# Patient Record
Sex: Male | Born: 1965 | Race: White | Hispanic: No | Marital: Married | Smoking: Former smoker
Health system: Southern US, Community
[De-identification: ages and names within clinical notes are randomized; demographics above are authoritative.]

## PROBLEM LIST (undated history)

## (undated) DIAGNOSIS — E041 Nontoxic single thyroid nodule: Secondary | ICD-10-CM

## (undated) DIAGNOSIS — T8859XA Other complications of anesthesia, initial encounter: Secondary | ICD-10-CM

## (undated) DIAGNOSIS — T4145XA Adverse effect of unspecified anesthetic, initial encounter: Secondary | ICD-10-CM

## (undated) DIAGNOSIS — I7100 Dissection of unspecified site of aorta: Secondary | ICD-10-CM

## (undated) DIAGNOSIS — G4730 Sleep apnea, unspecified: Secondary | ICD-10-CM

## (undated) DIAGNOSIS — I10 Essential (primary) hypertension: Secondary | ICD-10-CM

## (undated) DIAGNOSIS — R112 Nausea with vomiting, unspecified: Secondary | ICD-10-CM

## (undated) DIAGNOSIS — I499 Cardiac arrhythmia, unspecified: Secondary | ICD-10-CM

## (undated) DIAGNOSIS — M1712 Unilateral primary osteoarthritis, left knee: Secondary | ICD-10-CM

## (undated) DIAGNOSIS — Z9889 Other specified postprocedural states: Secondary | ICD-10-CM

## (undated) HISTORY — PX: LAMINECTOMY: SHX219

## (undated) HISTORY — PX: KNEE ARTHROSCOPY: SHX127

## (undated) HISTORY — PX: JOINT REPLACEMENT: SHX530

## (undated) HISTORY — PX: ELBOW SURGERY: SHX618

## (undated) HISTORY — PX: BACK SURGERY: SHX140

## (undated) HISTORY — PX: APPLICATION OF WOUND VAC: SHX5189

## (undated) HISTORY — PX: CHOLECYSTECTOMY: SHX55

---

## 2012-02-16 DIAGNOSIS — I7100 Dissection of unspecified site of aorta: Secondary | ICD-10-CM

## 2012-02-16 HISTORY — DX: Dissection of unspecified site of aorta: I71.00

## 2017-05-07 ENCOUNTER — Inpatient Hospital Stay (HOSPITAL_COMMUNITY)
Admission: EM | Admit: 2017-05-07 | Discharge: 2017-05-15 | DRG: 570 | Disposition: A | Discharge status: Home Health | Payer: Managed Care, Other (non HMO) | Attending: Orthopedic Surgery | Admitting: Orthopedic Surgery

## 2017-05-07 ENCOUNTER — Encounter (HOSPITAL_COMMUNITY): Payer: Self-pay | Admitting: Emergency Medicine

## 2017-05-07 DIAGNOSIS — S81019A Laceration without foreign body, unspecified knee, initial encounter: Secondary | ICD-10-CM | POA: Diagnosis present

## 2017-05-07 DIAGNOSIS — W208XXA Other cause of strike by thrown, projected or falling object, initial encounter: Secondary | ICD-10-CM | POA: Diagnosis present

## 2017-05-07 DIAGNOSIS — R52 Pain, unspecified: Secondary | ICD-10-CM

## 2017-05-07 DIAGNOSIS — S71139A Puncture wound without foreign body, unspecified thigh, initial encounter: Secondary | ICD-10-CM | POA: Diagnosis present

## 2017-05-07 DIAGNOSIS — M25562 Pain in left knee: Secondary | ICD-10-CM | POA: Diagnosis not present

## 2017-05-07 DIAGNOSIS — S71112A Laceration without foreign body, left thigh, initial encounter: Secondary | ICD-10-CM | POA: Diagnosis present

## 2017-05-07 DIAGNOSIS — I7100 Dissection of unspecified site of aorta: Secondary | ICD-10-CM | POA: Diagnosis present

## 2017-05-07 DIAGNOSIS — I10 Essential (primary) hypertension: Secondary | ICD-10-CM | POA: Diagnosis present

## 2017-05-07 DIAGNOSIS — S81009A Unspecified open wound, unspecified knee, initial encounter: Secondary | ICD-10-CM | POA: Diagnosis present

## 2017-05-07 DIAGNOSIS — S81832A Puncture wound without foreign body, left lower leg, initial encounter: Secondary | ICD-10-CM | POA: Diagnosis not present

## 2017-05-07 DIAGNOSIS — S81012A Laceration without foreign body, left knee, initial encounter: Secondary | ICD-10-CM | POA: Diagnosis present

## 2017-05-07 DIAGNOSIS — M726 Necrotizing fasciitis: Secondary | ICD-10-CM | POA: Diagnosis present

## 2017-05-07 DIAGNOSIS — Z6841 Body Mass Index (BMI) 40.0 and over, adult: Secondary | ICD-10-CM

## 2017-05-07 DIAGNOSIS — Z87891 Personal history of nicotine dependence: Secondary | ICD-10-CM

## 2017-05-07 DIAGNOSIS — S81839A Puncture wound without foreign body, unspecified lower leg, initial encounter: Secondary | ICD-10-CM

## 2017-05-07 HISTORY — PX: LACERATION REPAIR: SHX5168

## 2017-05-07 HISTORY — DX: Essential (primary) hypertension: I10

## 2017-05-07 HISTORY — DX: Dissection of unspecified site of aorta: I71.00
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
## 2017-05-08 ENCOUNTER — Emergency Department (HOSPITAL_COMMUNITY): Payer: Managed Care, Other (non HMO) | Admitting: Anesthesiology

## 2017-05-08 ENCOUNTER — Emergency Department (HOSPITAL_COMMUNITY): Payer: Managed Care, Other (non HMO)

## 2017-05-08 ENCOUNTER — Inpatient Hospital Stay (HOSPITAL_COMMUNITY): Payer: Managed Care, Other (non HMO)

## 2017-05-08 ENCOUNTER — Encounter (HOSPITAL_COMMUNITY)
Admission: EM | Disposition: A | Discharge status: Home Health | Payer: Self-pay | Source: Home / Self Care | Attending: Orthopedic Surgery

## 2017-05-08 ENCOUNTER — Encounter (HOSPITAL_COMMUNITY): Payer: Self-pay | Admitting: Anesthesiology

## 2017-05-08 DIAGNOSIS — W208XXA Other cause of strike by thrown, projected or falling object, initial encounter: Secondary | ICD-10-CM | POA: Diagnosis present

## 2017-05-08 DIAGNOSIS — S81009A Unspecified open wound, unspecified knee, initial encounter: Secondary | ICD-10-CM | POA: Diagnosis present

## 2017-05-08 DIAGNOSIS — I7100 Dissection of unspecified site of aorta: Secondary | ICD-10-CM | POA: Diagnosis present

## 2017-05-08 DIAGNOSIS — Z87891 Personal history of nicotine dependence: Secondary | ICD-10-CM | POA: Diagnosis not present

## 2017-05-08 DIAGNOSIS — M726 Necrotizing fasciitis: Secondary | ICD-10-CM | POA: Diagnosis present

## 2017-05-08 DIAGNOSIS — M25562 Pain in left knee: Secondary | ICD-10-CM | POA: Diagnosis present

## 2017-05-08 DIAGNOSIS — Z6841 Body Mass Index (BMI) 40.0 and over, adult: Secondary | ICD-10-CM | POA: Diagnosis not present

## 2017-05-08 DIAGNOSIS — S81012A Laceration without foreign body, left knee, initial encounter: Secondary | ICD-10-CM | POA: Diagnosis not present

## 2017-05-08 DIAGNOSIS — S81832A Puncture wound without foreign body, left lower leg, initial encounter: Secondary | ICD-10-CM | POA: Diagnosis present

## 2017-05-08 DIAGNOSIS — S71112A Laceration without foreign body, left thigh, initial encounter: Secondary | ICD-10-CM | POA: Diagnosis present

## 2017-05-08 DIAGNOSIS — I10 Essential (primary) hypertension: Secondary | ICD-10-CM | POA: Diagnosis present

## 2017-05-08 DIAGNOSIS — S81839A Puncture wound without foreign body, unspecified lower leg, initial encounter: Secondary | ICD-10-CM

## 2017-05-08 DIAGNOSIS — S81019A Laceration without foreign body, unspecified knee, initial encounter: Secondary | ICD-10-CM | POA: Diagnosis present

## 2017-05-08 HISTORY — PX: I & D EXTREMITY: SHX5045

## 2017-05-08 SURGERY — IRRIGATION AND DEBRIDEMENT EXTREMITY
Anesthesia: General | Site: Knee | Laterality: Left

## 2017-05-08 MED ORDER — PROPOFOL 10 MG/ML IV BOLUS
INTRAVENOUS | Status: AC
  Filled 2017-05-08: qty 20

## 2017-05-08 MED ORDER — DIPHENHYDRAMINE HCL 50 MG/ML IJ SOLN
INTRAMUSCULAR | Status: DC | PRN
  Administered 2017-05-08: 12.5 mg via INTRAVENOUS

## 2017-05-08 MED ORDER — ACETAMINOPHEN 325 MG PO TABS
650.0000 mg | ORAL_TABLET | Freq: Four times a day (QID) | ORAL | Status: DC | PRN
  Administered 2017-05-09 (×2): 650 mg via ORAL
  Filled 2017-05-08 (×3): qty 2

## 2017-05-08 MED ORDER — LIDOCAINE HCL (CARDIAC) 20 MG/ML IV SOLN
INTRAVENOUS | Status: DC | PRN
  Administered 2017-05-08: 100 mg via INTRATRACHEAL

## 2017-05-08 MED ORDER — HYDROMORPHONE HCL 1 MG/ML IJ SOLN
INTRAMUSCULAR | Status: AC
  Filled 2017-05-08: qty 1

## 2017-05-08 MED ORDER — ONDANSETRON HCL 4 MG/2ML IJ SOLN
INTRAMUSCULAR | Status: DC | PRN
  Administered 2017-05-08: 4 mg via INTRAVENOUS

## 2017-05-08 MED ORDER — ONDANSETRON HCL 4 MG/2ML IJ SOLN
4.0000 mg | Freq: Four times a day (QID) | INTRAMUSCULAR | Status: DC | PRN
  Administered 2017-05-10: 4 mg via INTRAVENOUS

## 2017-05-08 MED ORDER — SODIUM CHLORIDE 0.9 % IR SOLN
Status: DC | PRN
  Administered 2017-05-08: 3000 mL
  Administered 2017-05-08: 6000 mL

## 2017-05-08 MED ORDER — FENTANYL CITRATE (PF) 250 MCG/5ML IJ SOLN
INTRAMUSCULAR | Status: DC | PRN
  Administered 2017-05-08: 100 ug via INTRAVENOUS
  Administered 2017-05-08 (×2): 25 ug via INTRAVENOUS

## 2017-05-08 MED ORDER — HYDROMORPHONE HCL 1 MG/ML IJ SOLN
0.5000 mg | INTRAMUSCULAR | Status: DC | PRN
  Administered 2017-05-08 (×3): 0.5 mg via INTRAVENOUS

## 2017-05-08 MED ORDER — LACTATED RINGERS IV SOLN
INTRAVENOUS | Status: DC
  Administered 2017-05-08 – 2017-05-09 (×3): via INTRAVENOUS

## 2017-05-08 MED ORDER — FENTANYL CITRATE (PF) 250 MCG/5ML IJ SOLN
INTRAMUSCULAR | Status: AC
  Filled 2017-05-08: qty 5

## 2017-05-08 MED ORDER — METHOCARBAMOL 1000 MG/10ML IJ SOLN
500.0000 mg | Freq: Four times a day (QID) | INTRAVENOUS | Status: DC | PRN
  Filled 2017-05-08 (×2): qty 5

## 2017-05-08 MED ORDER — ONDANSETRON HCL 4 MG PO TABS: 4.0000 mg | ORAL_TABLET | Freq: Four times a day (QID) | ORAL | Status: DC | PRN

## 2017-05-08 MED ORDER — METOPROLOL TARTRATE 5 MG/5ML IV SOLN
INTRAVENOUS | Status: DC | PRN
  Administered 2017-05-08: 2 mg via INTRAVENOUS

## 2017-05-08 MED ORDER — METOCLOPRAMIDE HCL 5 MG/ML IJ SOLN: 5.0000 mg | Freq: Three times a day (TID) | INTRAMUSCULAR | Status: DC | PRN

## 2017-05-08 MED ORDER — CEFAZOLIN SODIUM-DEXTROSE 2-4 GM/100ML-% IV SOLN
2.0000 g | Freq: Once | INTRAVENOUS | Status: AC
  Administered 2017-05-08: 2 g via INTRAVENOUS
  Filled 2017-05-08: qty 100

## 2017-05-08 MED ORDER — MIDAZOLAM HCL 2 MG/2ML IJ SOLN
INTRAMUSCULAR | Status: AC
  Filled 2017-05-08: qty 2

## 2017-05-08 MED ORDER — MIDAZOLAM HCL 5 MG/5ML IJ SOLN
INTRAMUSCULAR | Status: DC | PRN
  Administered 2017-05-08: 2 mg via INTRAVENOUS

## 2017-05-08 MED ORDER — EPHEDRINE SULFATE 50 MG/ML IJ SOLN
INTRAMUSCULAR | Status: DC | PRN
  Administered 2017-05-08: 5 mg via INTRAVENOUS

## 2017-05-08 MED ORDER — METHOCARBAMOL 500 MG PO TABS
ORAL_TABLET | ORAL | Status: AC
  Filled 2017-05-08: qty 1

## 2017-05-08 MED ORDER — CEFAZOLIN SODIUM-DEXTROSE 2-4 GM/100ML-% IV SOLN
2.0000 g | Freq: Three times a day (TID) | INTRAVENOUS | Status: DC
  Administered 2017-05-08 – 2017-05-10 (×5): 2 g via INTRAVENOUS
  Filled 2017-05-08 (×6): qty 100

## 2017-05-08 MED ORDER — OXYCODONE HCL 5 MG PO TABS
5.0000 mg | ORAL_TABLET | ORAL | Status: DC | PRN
  Administered 2017-05-08: 10 mg via ORAL
  Filled 2017-05-08: qty 2

## 2017-05-08 MED ORDER — METHOCARBAMOL 500 MG PO TABS
500.0000 mg | ORAL_TABLET | Freq: Four times a day (QID) | ORAL | Status: DC | PRN
  Administered 2017-05-08 – 2017-05-10 (×5): 500 mg via ORAL
  Filled 2017-05-08 (×5): qty 1

## 2017-05-08 MED ORDER — PROPOFOL 10 MG/ML IV BOLUS
INTRAVENOUS | Status: DC | PRN
  Administered 2017-05-08: 200 mg via INTRAVENOUS

## 2017-05-08 MED ORDER — ACETAMINOPHEN 650 MG RE SUPP: 650.0000 mg | Freq: Four times a day (QID) | RECTAL | Status: DC | PRN

## 2017-05-08 MED ORDER — OXYCODONE HCL 5 MG PO TABS
20.0000 mg | ORAL_TABLET | ORAL | Status: DC | PRN
  Administered 2017-05-08 – 2017-05-13 (×11): 20 mg via ORAL
  Filled 2017-05-08 (×12): qty 4

## 2017-05-08 MED ORDER — 0.9 % SODIUM CHLORIDE (POUR BTL) OPTIME
TOPICAL | Status: DC | PRN
  Administered 2017-05-08: 1000 mL

## 2017-05-08 MED ORDER — MORPHINE SULFATE (PF) 4 MG/ML IV SOLN
2.0000 mg | INTRAVENOUS | Status: DC | PRN
  Administered 2017-05-08 – 2017-05-09 (×10): 2 mg via INTRAVENOUS
  Filled 2017-05-08 (×10): qty 1

## 2017-05-08 MED ORDER — CEFAZOLIN SODIUM-DEXTROSE 2-4 GM/100ML-% IV SOLN
2.0000 g | Freq: Four times a day (QID) | INTRAVENOUS | Status: DC
  Administered 2017-05-08 (×2): 2 g via INTRAVENOUS
  Filled 2017-05-08 (×3): qty 100

## 2017-05-08 MED ORDER — LACTATED RINGERS IV SOLN
INTRAVENOUS | Status: DC | PRN
  Administered 2017-05-08 (×2): via INTRAVENOUS

## 2017-05-08 MED ORDER — STERILE WATER FOR IRRIGATION IR SOLN
Status: DC | PRN
  Administered 2017-05-08: 50 mL

## 2017-05-08 MED ORDER — METOCLOPRAMIDE HCL 5 MG PO TABS: 5.0000 mg | ORAL_TABLET | Freq: Three times a day (TID) | ORAL | Status: DC | PRN

## 2017-05-08 SURGICAL SUPPLY — 68 items
BLADE CUDA 5.5 (BLADE) IMPLANT
BLADE GREAT WHITE 4.2 (BLADE) IMPLANT
BLADE GREAT WHITE 4.2MM (BLADE)
BNDG COHESIVE 6X5 TAN STRL LF (GAUZE/BANDAGES/DRESSINGS) IMPLANT
BNDG ELASTIC 6X15 VLCR STRL LF (GAUZE/BANDAGES/DRESSINGS) ×4 IMPLANT
BUR OVAL 6.0 (BURR) IMPLANT
CANISTER WOUND CARE 500ML ATS (WOUND CARE) ×4 IMPLANT
COVER SURGICAL LIGHT HANDLE (MISCELLANEOUS) ×4 IMPLANT
CUFF TOURNIQUET SINGLE 34IN LL (TOURNIQUET CUFF) IMPLANT
CUFF TOURNIQUET SINGLE 44IN (TOURNIQUET CUFF) IMPLANT
DRAIN CHANNEL 15F RND FF W/TCR (WOUND CARE) ×4 IMPLANT
DRAIN CHANNEL 19F RND (DRAIN) IMPLANT
DRAIN JACKSON RD 7FR 3/32 (WOUND CARE) IMPLANT
DRAPE ARTHROSCOPY W/POUCH 114 (DRAPES) IMPLANT
DRAPE EXTREMITY TIBURON (DRAPES) ×4 IMPLANT
DRAPE ORTHO SPLIT 77X108 STRL (DRAPES)
DRAPE SURG ORHT 6 SPLT 77X108 (DRAPES) IMPLANT
DRAPE U-SHAPE 47X51 STRL (DRAPES) IMPLANT
DRSG ADAPTIC 3X8 NADH LF (GAUZE/BANDAGES/DRESSINGS) ×4 IMPLANT
DRSG EMULSION OIL 3X3 NADH (GAUZE/BANDAGES/DRESSINGS) IMPLANT
DRSG MEPILEX BORDER 4X8 (GAUZE/BANDAGES/DRESSINGS) IMPLANT
DRSG PAD ABDOMINAL 8X10 ST (GAUZE/BANDAGES/DRESSINGS) ×4 IMPLANT
DRSG VAC ATS LRG SENSATRAC (GAUZE/BANDAGES/DRESSINGS) ×4 IMPLANT
DRSG VAC ATS MED SENSATRAC (GAUZE/BANDAGES/DRESSINGS) ×4 IMPLANT
DURAPREP 26ML APPLICATOR (WOUND CARE) IMPLANT
ELECT PENCIL ROCKER SW 15FT (MISCELLANEOUS) ×4 IMPLANT
EVACUATOR SILICONE 100CC (DRAIN) ×4 IMPLANT
GAUZE SPONGE 4X4 12PLY STRL (GAUZE/BANDAGES/DRESSINGS) ×4 IMPLANT
GLOVE BIO SURGEON STRL SZ 6.5 (GLOVE) IMPLANT
GLOVE BIO SURGEONS STRL SZ 6.5 (GLOVE)
GLOVE BIOGEL PI IND STRL 6.5 (GLOVE) IMPLANT
GLOVE BIOGEL PI IND STRL 8.5 (GLOVE) ×2 IMPLANT
GLOVE BIOGEL PI INDICATOR 6.5 (GLOVE)
GLOVE BIOGEL PI INDICATOR 8.5 (GLOVE) ×2
GLOVE SS BIOGEL STRL SZ 8.5 (GLOVE) ×2 IMPLANT
GLOVE SUPERSENSE BIOGEL SZ 8.5 (GLOVE) ×2
GOWN STRL REUS W/ TWL LRG LVL3 (GOWN DISPOSABLE) ×2 IMPLANT
GOWN STRL REUS W/TWL 2XL LVL3 (GOWN DISPOSABLE) ×4 IMPLANT
GOWN STRL REUS W/TWL LRG LVL3 (GOWN DISPOSABLE) ×2
HOVERMATT SINGLE USE (MISCELLANEOUS) ×4 IMPLANT
KIT BASIN OR (CUSTOM PROCEDURE TRAY) ×4 IMPLANT
KIT DRSG PREVENA PLUS 7DAY 125 (MISCELLANEOUS) IMPLANT
KIT ROOM TURNOVER OR (KITS) ×4 IMPLANT
MANIFOLD NEPTUNE II (INSTRUMENTS) ×4 IMPLANT
NEEDLE 18GX1X1/2 (RX/OR ONLY) (NEEDLE) IMPLANT
PACK ARTHROSCOPY DSU (CUSTOM PROCEDURE TRAY) ×4 IMPLANT
PAD ARMBOARD 7.5X6 YLW CONV (MISCELLANEOUS) ×4 IMPLANT
PADDING CAST COTTON 6X4 STRL (CAST SUPPLIES) IMPLANT
SET ARTHROSCOPY TUBING (MISCELLANEOUS) ×2
SET ARTHROSCOPY TUBING LN (MISCELLANEOUS) ×2 IMPLANT
SPONGE LAP 18X18 X RAY DECT (DISPOSABLE) ×8 IMPLANT
SPONGE LAP 4X18 X RAY DECT (DISPOSABLE) ×4 IMPLANT
SURGIFLO W/THROMBIN 8M KIT (HEMOSTASIS) IMPLANT
SUT BONE WAX W31G (SUTURE) IMPLANT
SUT ETHILON 4 0 PS 2 18 (SUTURE) IMPLANT
SUT MON AB 2-0 CT1 36 (SUTURE) ×4 IMPLANT
SUT SILK 1 SH (SUTURE) ×4 IMPLANT
SUT VIC AB 1 CT1 27 (SUTURE) ×2
SUT VIC AB 1 CT1 27XBRD ANBCTR (SUTURE) ×2 IMPLANT
SUT VIC AB 2-0 CT1 18 (SUTURE) ×4 IMPLANT
SUT VIC AB 2-0 CT1 27 (SUTURE) ×2
SUT VIC AB 2-0 CT1 TAPERPNT 27 (SUTURE) ×2 IMPLANT
SYR 20CC LL (SYRINGE) IMPLANT
TOWEL OR 17X24 6PK STRL BLUE (TOWEL DISPOSABLE) ×4 IMPLANT
TUBE CONNECTING 12'X1/4 (SUCTIONS) ×1
TUBE CONNECTING 12X1/4 (SUCTIONS) ×3 IMPLANT
WAND HAND CNTRL MULTIVAC 90 (MISCELLANEOUS) IMPLANT
WATER STERILE IRR 1000ML POUR (IV SOLUTION) ×4 IMPLANT

## 2017-05-08 NOTE — Brief Op Note (Signed)
05/07/2017 - 05/08/2017  3:37 AM  PATIENT:  Bill [name]  51 y.o. male  PRE-OPERATIVE DIAGNOSIS:  left traumatic knee injury  POST-OPERATIVE DIAGNOSIS:  left traumatic knee injury  PROCEDURE:  Procedure(s): IRRIGATION AND DEBRIDEMENT left knee joint and thigh (Left)  SURGEON:  Surgeon(s) and Role:    Venita Lick, MD - Primary  PHYSICIAN ASSISTANT:   ASSISTANTS: none   ANESTHESIA:   general  EBL:  Total I/O In: 1000 [I.V.:1000] Out: 50 [Blood:50]  BLOOD ADMINISTERED:none  DRAINS: Wound vac.  JP in left knee   LOCAL MEDICATIONS USED:  NONE  SPECIMEN:  No Specimen  DISPOSITION OF SPECIMEN:  N/A  COUNTS:  YES  TOURNIQUET:  * No tourniquets in log *  DICTATION: .Dragon Dictation  PLAN OF CARE: Admit to inpatient   PATIENT DISPOSITION:  PACU - hemodynamically stable.
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
## 2017-05-08 NOTE — Anesthesia Procedure Notes (Signed)
Procedure Name: LMA Insertion Date/Time: 05/08/2017 1:50 AM Performed by: Brien Mates D Pre-anesthesia Checklist: Patient identified, Emergency Drugs available, Suction available, Patient being monitored and Timeout performed Patient Re-evaluated:Patient Re-evaluated prior to induction Oxygen Delivery Method: Circle system utilized Preoxygenation: Pre-oxygenation with 100% oxygen Induction Type: IV induction LMA: LMA with gastric port inserted LMA Size: 5.0 Number of attempts: 1 Placement Confirmation: positive ETCO2 and breath sounds checked- equal and bilateral Tube secured with: Tape Dental Injury: Teeth and Oropharynx as per pre-operative assessment

## 2017-05-08 NOTE — Anesthesia Postprocedure Evaluation (Signed)
Anesthesia Post Note  Patient: Bill [name]  Procedure(s) Performed: IRRIGATION AND DEBRIDEMENT left knee joint and thigh (Left Knee)     Patient location during evaluation: PACU Anesthesia Type: General Level of consciousness: sedated Pain management: pain level controlled Vital Signs Assessment: post-procedure vital signs reviewed and stable Respiratory status: spontaneous breathing and respiratory function stable Cardiovascular status: stable Postop Assessment: no apparent nausea or vomiting Anesthetic complications: no    Last Vitals:  Vitals:   05/08/17 0445 05/08/17 0515  BP: (!) 126/110 (!) 118/58  Pulse: (!) 51 60  Resp: 12 18  Temp: (!) 36.4 C   SpO2: 97% 99%    Last Pain:  Vitals:   05/08/17 0515  TempSrc: Oral  PainSc:                  [name],Tirrell DANIEL

## 2017-05-08 NOTE — Progress Notes (Addendum)
    Subjective: Day of Surgery Procedure(s) (LRB): IRRIGATION AND DEBRIDEMENT left knee joint and thigh (Left) Patient reports pain as 6 on 0-10 scale.   Denies CP or SOB.  Voiding without difficulty. Positive flatus. Objective: Vital signs in last 24 hours: Temp:  [97.3 F (36.3 C)-98.3 F (36.8 C)] 98.3 F (36.8 C) (10/12 0515) Pulse Rate:  [51-75] 60 (10/12 0515) Resp:  [12-18] 18 (10/12 0515) BP: (116-141)/(58-110) 118/58 (10/12 0515) SpO2:  [91 %-99 %] 99 % (10/12 0515) Weight:  [161.7 kg (356 lb 8 oz)-199.1 kg (439 lb)] 161.7 kg (356 lb 8 oz) (10/12 0515)  Intake/Output from previous day: 10/11 0701 - 10/12 0700 In: 1000 [I.V.:1000] Out: 50 [Blood:50] Intake/Output this shift: No intake/output data recorded.  Labs: No results for input(s): HGB in the last 72 hours. No results for input(s): WBC, RBC, HCT, PLT in the last 72 hours. No results for input(s): NA, K, CL, CO2, BUN, CREATININE, GLUCOSE, CALCIUM in the last 72 hours. No results for input(s): LABPT, INR in the last 72 hours.  Physical Exam: Neurologically intact ABD soft Sensation intact distally Dorsiflexion/Plantar flexion intact Incision: wound vac in place Compartment soft  Assessment/Plan: Day of Surgery Procedure(s) (LRB): IRRIGATION AND DEBRIDEMENT left knee joint and thigh (Left) Continue NPO pt may return to OR  [facility], [place] for Dr. Venita Lick [facility] [phone] 05/08/2017, 7:29 AM   Appreciate input from Dr. Lajoyce Corners. Plan on return to OR Sunday for repeat I&D and possible wound closure. At present patient only complains of anterior knee pain. Otherwise he is doing well. No fevers or chills. Compartments are soft and nontender.  At this point time I will adjust his pain medication For the increased anterior knee pain.  I will also order physical therapy as patient can be mobilized out of bed at this point.

## 2017-05-08 NOTE — ED Notes (Signed)
Pt undressed. Valuables and belongings given to family member.

## 2017-05-08 NOTE — Progress Notes (Signed)
Patient received from PACU, alert and oriented x4. VS stable, on 2L O2 sat 98%. Wound vac to left leg, clean,dry and intact. JP drain to left lateral knee,in place and dressing,clean,dry and intact. Pain 2/10. Wife at bedside.

## 2017-05-08 NOTE — Care Management Note (Signed)
Case Management Note  Patient Details  Name: Bill [name] MRN: [id number] Date of Birth: Jul 07, 1966  Subjective/Objective:                    Action/[name]:  Patient at present has wound VAC.   Patient has [employer]. To arrange home health  insurance company requires NCM to call  [employer] and request home health and fax clinical information . Then [employer] arranges home health. Sometimes there can be a delay in [employer] finding an agency to accept referral.   PA note states patient may return to surgery. Also would need wound measurements for VAC application.   Spoke to [place] at Dr Shon Baton office , explained above. Wound care for post discharge from hospital has not been determined at this point, therefore MD unable to provide [facility] order with instructions.   Once it is determined if home VAC is needed and HHRN , please order HHRN and sign VAC application in shadow chart and call NCM to begin process. Thanks Bill Flurry RN BSN [phone] . Expected Discharge Date:                  Expected Discharge [name]:  Home w Home Health Services  In-House Referral:     Discharge planning Services  CM Consult  Post Acute Care Choice:    Choice offered to:     DME Arranged:    DME Agency:     HH Arranged:    HH Agency:     Status of Service:  In process, will continue to follow  If discussed at Long Length of Stay Meetings, dates discussed:    Additional Comments:  Bill Plan, RN 05/08/2017, 11:30 AM
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
## 2017-05-08 NOTE — Transfer of Care (Signed)
Immediate Anesthesia Transfer of Care Note  Patient: Bill [name]  Procedure(s) Performed: IRRIGATION AND DEBRIDEMENT left knee joint and thigh (Left Knee)  Patient Location: PACU  Anesthesia Type:General  Level of Consciousness: drowsy  Airway & Oxygen Therapy: Patient Spontanous Breathing and Patient connected to face mask oxygen  Post-op Assessment: Report given to RN and Post -op Vital signs reviewed and stable  Post vital signs: Reviewed and stable  Last Vitals:  Vitals:   05/08/17 0000 05/08/17 0101  BP: 128/85 (!) 116/95  Pulse: 67 75  Resp: 18 18  Temp:    SpO2: 98% 97%    Last Pain:  Vitals:   05/07/17 2338  TempSrc:   PainSc: 8          Complications: No apparent anesthesia complications

## 2017-05-08 NOTE — ED Notes (Signed)
X-ray at bedside
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
## 2017-05-08 NOTE — Anesthesia Preprocedure Evaluation (Addendum)
Anesthesia Evaluation  Patient identified by MRN, date of birth, ID band Patient awake    Reviewed: Allergy & Precautions, NPO status , Patient's Chart, lab work & pertinent test results, reviewed documented beta blocker date and time   History of Anesthesia Complications Negative for: history of anesthetic complications  Airway Mallampati: III  TM Distance: >3 FB Neck ROM: Full    Dental no notable dental hx. (+) Dental Advisory Given   Pulmonary former smoker,    Pulmonary exam normal        Cardiovascular hypertension, Pt. on home beta blockers and Pt. on medications + Peripheral Vascular Disease  Normal cardiovascular exam  Hx of aortic dissection: stable for 5 years   Neuro/Psych negative neurological ROS  negative psych ROS   GI/Hepatic negative GI ROS, Neg liver ROS,   Endo/Other  Morbid obesity  Renal/GU negative Renal ROS     Musculoskeletal   Abdominal   Peds  Hematology negative hematology ROS (+)   Anesthesia Other Findings   Reproductive/Obstetrics                          Anesthesia Physical Anesthesia Plan  ASA: III and emergent  Anesthesia Plan: General   Post-op Pain Management:    Induction:   PONV Risk Score and Plan: 3 and Ondansetron, Dexamethasone and Diphenhydramine  Airway Management Planned: Oral ETT and LMA  Additional Equipment:   Intra-op Plan:   Post-operative Plan: Extubation in OR  Informed Consent: I have reviewed the patients History and Physical, chart, labs and discussed the procedure including the risks, benefits and alternatives for the proposed anesthesia with the patient or authorized representative who has indicated his/her understanding and acceptance.   Dental advisory given  Plan Discussed with: CRNA, Anesthesiologist and Surgeon  Anesthesia Plan Comments:        Anesthesia Quick Evaluation

## 2017-05-08 NOTE — Anesthesia Preprocedure Evaluation (Addendum)
Anesthesia Evaluation  Patient identified by MRN, date of birth, ID band Patient awake    Reviewed: Allergy & Precautions, NPO status , Patient's Chart, lab work & pertinent test results  History of Anesthesia Complications Negative for: history of anesthetic complications  Airway Mallampati: II  TM Distance: >3 FB Neck ROM: Full    Dental  (+) Teeth Intact, Dental Advisory Given   Pulmonary former smoker,    Pulmonary exam normal        Cardiovascular hypertension, Normal cardiovascular exam     Neuro/Psych negative neurological ROS  negative psych ROS   GI/Hepatic negative GI ROS, Neg liver ROS,   Endo/Other  negative endocrine ROS  Renal/GU negative Renal ROS     Musculoskeletal   Abdominal   Peds  Hematology   Anesthesia Other Findings   Reproductive/Obstetrics                            Anesthesia Physical Anesthesia Plan  ASA: III and emergent  Anesthesia Plan: General   Post-op Pain Management:    Induction: Intravenous  PONV Risk Score and Plan:   Airway Management Planned: LMA  Additional Equipment:   Intra-op Plan:   Post-operative Plan: Extubation in OR  Informed Consent: I have reviewed the patients History and Physical, chart, labs and discussed the procedure including the risks, benefits and alternatives for the proposed anesthesia with the patient or authorized representative who has indicated his/her understanding and acceptance.   Dental advisory given  Plan Discussed with: CRNA, Anesthesiologist and Surgeon  Anesthesia Plan Comments:         Anesthesia Quick Evaluation

## 2017-05-09 MED ORDER — TOPIRAMATE 100 MG PO TABS
50.0000 mg | ORAL_TABLET | Freq: Two times a day (BID) | ORAL | Status: DC
  Administered 2017-05-09: 50 mg via ORAL
  Filled 2017-05-09: qty 1

## 2017-05-09 MED ORDER — PRAVASTATIN SODIUM 10 MG PO TABS
20.0000 mg | ORAL_TABLET | Freq: Every day | ORAL | Status: DC
  Administered 2017-05-09: 20 mg via ORAL
  Filled 2017-05-09: qty 2

## 2017-05-09 MED ORDER — FUROSEMIDE 20 MG PO TABS
20.0000 mg | ORAL_TABLET | Freq: Every day | ORAL | Status: DC
  Administered 2017-05-09: 20 mg via ORAL

## 2017-05-09 MED ORDER — ENOXAPARIN SODIUM 40 MG/0.4ML ~~LOC~~ SOLN
40.0000 mg | Freq: Once | SUBCUTANEOUS | Status: AC
  Administered 2017-05-09: 40 mg via SUBCUTANEOUS
  Filled 2017-05-09: qty 0.4

## 2017-05-09 MED ORDER — EZETIMIBE 10 MG PO TABS
10.0000 mg | ORAL_TABLET | Freq: Every day | ORAL | Status: DC
  Administered 2017-05-09 – 2017-05-12 (×3): 10 mg via ORAL
  Filled 2017-05-09 (×4): qty 1

## 2017-05-09 MED ORDER — HYDROCHLOROTHIAZIDE 25 MG PO TABS
25.0000 mg | ORAL_TABLET | Freq: Every day | ORAL | Status: DC
  Administered 2017-05-09: 25 mg via ORAL
  Filled 2017-05-09: qty 1

## 2017-05-09 MED ORDER — LOSARTAN POTASSIUM 50 MG PO TABS
100.0000 mg | ORAL_TABLET | Freq: Every day | ORAL | Status: DC
  Administered 2017-05-09: 100 mg via ORAL
  Filled 2017-05-09: qty 2

## 2017-05-09 MED ORDER — PREDNISONE 10 MG PO TABS
10.0000 mg | ORAL_TABLET | Freq: Every day | ORAL | Status: DC
  Filled 2017-05-09: qty 1
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
## 2017-05-09 NOTE — Progress Notes (Addendum)
Pt on home meds not ordered here.  His cardiologist in [place], [place] is Dr. Eual Fines. Dr. Veda Canning called and notified.  BP went up when pt got up with PT just now.  BP at present 132/76.  Pt has a history of an aortic dissection 01/2012.  ADDENDUM: Home meds resumed.

## 2017-05-09 NOTE — Progress Notes (Signed)
   Subjective:  Patient reports pain as mild to moderate.  No c/o.  Objective:   VITALS:   Vitals:   05/08/17 1653 05/08/17 2137 05/09/17 0136 05/09/17 0513  BP: (!) 128/54 (!) 110/55 117/71 111/67  Pulse: 78 86 87 85  Resp: Temp: 98.2 F (36.8 C) 98.2 F (36.8 C) 98.4 F (36.9 C) 98.4 F (36.9 C)  TempSrc: Oral Oral Oral Oral  SpO2: 97% 97% 92% 96%  Weight:      Height:        NAD ABD soft Sensation intact distally Intact pulses distally Dorsiflexion/Plantar flexion intact Incision: dressing C/D/I Compartment soft VAC intact Drain intact   No results found for: WBC, HGB, HCT, MCV, PLT BMET No results found for: NA, K, CL, CO2, GLUCOSE, BUN, CREATININE, CALCIUM, GFRNONAA, GFRAA   Assessment/Plan: 1 Day Post-Op   Active Problems:   Laceration of knee   Open knee wound   Penetrating injury of lower extremity   WBAT with walker DVT ppx: lovenox OTO today, SCDs PT/OT IV ancef Cont VAC Dispo: return to OR with Dr. Lajoyce Corners tomorrow, NPO after MN, hold chemical DVT ppx after am dose today     Sidonia Nutter, Zakaria Sedor 05/09/2017, 9:14 AM   Samson Frederic, MD Cell [phone]

## 2017-05-10 ENCOUNTER — Inpatient Hospital Stay (HOSPITAL_COMMUNITY): Payer: Managed Care, Other (non HMO) | Admitting: Anesthesiology

## 2017-05-10 ENCOUNTER — Encounter (HOSPITAL_COMMUNITY)
Admission: EM | Disposition: A | Discharge status: Home Health | Payer: Self-pay | Source: Home / Self Care | Attending: Orthopedic Surgery

## 2017-05-10 DIAGNOSIS — M726 Necrotizing fasciitis: Secondary | ICD-10-CM | POA: Diagnosis present

## 2017-05-10 DIAGNOSIS — S71139A Puncture wound without foreign body, unspecified thigh, initial encounter: Secondary | ICD-10-CM | POA: Diagnosis present

## 2017-05-10 HISTORY — PX: I & D EXTREMITY: SHX5045

## 2017-05-10 LAB — CBC
HCT: 32.5 % — ABNORMAL LOW (ref 39.0–52.0)
HCT: 33.3 % — ABNORMAL LOW (ref 39.0–52.0)
Hemoglobin: 10.3 g/dL — ABNORMAL LOW (ref 13.0–17.0)
Hemoglobin: 10.7 g/dL — ABNORMAL LOW (ref 13.0–17.0)
MCH: 28.9 pg (ref 26.0–34.0)
MCH: 29.3 pg (ref 26.0–34.0)
MCHC: 31.7 g/dL (ref 30.0–36.0)
MCHC: 32.1 g/dL (ref 30.0–36.0)
MCV: 91 fL (ref 78.0–100.0)
MCV: 91.2 fL (ref 78.0–100.0)
PLATELETS: 173 10*3/uL (ref 150–400)
PLATELETS: 169 10*3/uL (ref 150–400)
RBC: 3.57 MIL/uL — ABNORMAL LOW (ref 4.22–5.81)
RBC: 3.65 MIL/uL — AB (ref 4.22–5.81)
RDW: 14.3 % (ref 11.5–15.5)
RDW: 14.7 % (ref 11.5–15.5)
WBC: 6.6 10*3/uL (ref 4.0–10.5)
WBC: 8 10*3/uL (ref 4.0–10.5)

## 2017-05-10 LAB — BASIC METABOLIC PANEL
Anion gap: 10 (ref 5–15)
BUN: 13 mg/dL (ref 6–20)
CO2: 28 mmol/L (ref 22–32)
CREATININE: 1.16 mg/dL (ref 0.61–1.24)
Calcium: 8.2 mg/dL — ABNORMAL LOW (ref 8.9–10.3)
Chloride: 98 mmol/L — ABNORMAL LOW (ref 101–111)
GFR calc Af Amer: >60 mL/min (ref >60)
GLUCOSE: 133 mg/dL — AB (ref 65–99)
Potassium: 3.1 mmol/L — ABNORMAL LOW (ref 3.5–5.1)
SODIUM: 136 mmol/L (ref 135–145)

## 2017-05-10 LAB — ABO/RH: ABO/RH(D): AB NEG

## 2017-05-10 LAB — TYPE AND SCREEN
ABO/RH(D): AB NEG
Antibody Screen: NEGATIVE

## 2017-05-10 SURGERY — IRRIGATION AND DEBRIDEMENT EXTREMITY
Anesthesia: General | Site: Knee | Laterality: Left

## 2017-05-10 MED ORDER — MIDAZOLAM HCL 5 MG/5ML IJ SOLN
INTRAMUSCULAR | Status: DC | PRN
  Administered 2017-05-10: 2 mg via INTRAVENOUS

## 2017-05-10 MED ORDER — METHOCARBAMOL 500 MG PO TABS
500.0000 mg | ORAL_TABLET | Freq: Four times a day (QID) | ORAL | Status: DC | PRN
  Administered 2017-05-11: 500 mg via ORAL
  Filled 2017-05-10: qty 1

## 2017-05-10 MED ORDER — LOSARTAN POTASSIUM 50 MG PO TABS: 100.0000 mg | ORAL_TABLET | Freq: Every day | ORAL | Status: DC

## 2017-05-10 MED ORDER — ACETAMINOPHEN 650 MG RE SUPP: 650.0000 mg | Freq: Four times a day (QID) | RECTAL | Status: DC | PRN

## 2017-05-10 MED ORDER — OXYCODONE HCL 5 MG PO TABS: 5.0000 mg | ORAL_TABLET | Freq: Once | ORAL | Status: DC | PRN

## 2017-05-10 MED ORDER — HYDROMORPHONE HCL 1 MG/ML IJ SOLN
INTRAMUSCULAR | Status: AC
  Filled 2017-05-10: qty 1

## 2017-05-10 MED ORDER — ONDANSETRON HCL 4 MG/2ML IJ SOLN: 4.0000 mg | Freq: Four times a day (QID) | INTRAMUSCULAR | Status: DC | PRN

## 2017-05-10 MED ORDER — OXYCODONE HCL 5 MG PO TABS
5.0000 mg | ORAL_TABLET | ORAL | Status: DC | PRN
  Administered 2017-05-11 (×2): 10 mg via ORAL
  Administered 2017-05-11: 5 mg via ORAL
  Administered 2017-05-11 – 2017-05-13 (×3): 10 mg via ORAL
  Filled 2017-05-10 (×6): qty 2

## 2017-05-10 MED ORDER — PROPOFOL 10 MG/ML IV BOLUS
INTRAVENOUS | Status: DC | PRN
Start: 2017-05-10 — End: 2017-05-10
  Administered 2017-05-10: 250 mg via INTRAVENOUS

## 2017-05-10 MED ORDER — HYDROMORPHONE HCL 1 MG/ML IJ SOLN
0.2500 mg | INTRAMUSCULAR | Status: DC | PRN
  Administered 2017-05-10 (×4): 0.5 mg via INTRAVENOUS

## 2017-05-10 MED ORDER — METOCLOPRAMIDE HCL 5 MG PO TABS: 5.0000 mg | ORAL_TABLET | Freq: Three times a day (TID) | ORAL | Status: DC | PRN

## 2017-05-10 MED ORDER — FENTANYL CITRATE (PF) 250 MCG/5ML IJ SOLN
INTRAMUSCULAR | Status: AC
  Filled 2017-05-10: qty 5

## 2017-05-10 MED ORDER — LACTATED RINGERS IV SOLN
INTRAVENOUS | Status: DC | PRN
  Administered 2017-05-10: 07:00:00 via INTRAVENOUS

## 2017-05-10 MED ORDER — DEXAMETHASONE SODIUM PHOSPHATE 10 MG/ML IJ SOLN
INTRAMUSCULAR | Status: DC | PRN
  Administered 2017-05-10: 10 mg via INTRAVENOUS

## 2017-05-10 MED ORDER — OXYCODONE HCL 5 MG PO TABS: 5.0000 mg | ORAL_TABLET | ORAL | Status: DC | PRN

## 2017-05-10 MED ORDER — METHOCARBAMOL 1000 MG/10ML IJ SOLN: 500.0000 mg | Freq: Four times a day (QID) | INTRAVENOUS | Status: DC | PRN

## 2017-05-10 MED ORDER — BISACODYL 10 MG RE SUPP: 10.0000 mg | Freq: Every day | RECTAL | Status: DC | PRN

## 2017-05-10 MED ORDER — POLYETHYLENE GLYCOL 3350 17 G PO PACK: 17.0000 g | PACK | Freq: Every day | ORAL | Status: DC | PRN

## 2017-05-10 MED ORDER — SODIUM CHLORIDE 0.9 % IV SOLN
INTRAVENOUS | Status: DC
  Administered 2017-05-10: 12:00:00 via INTRAVENOUS

## 2017-05-10 MED ORDER — DEXTROSE 5 % IV SOLN
500.0000 mg | Freq: Four times a day (QID) | INTRAVENOUS | Status: DC | PRN
  Filled 2017-05-10: qty 5

## 2017-05-10 MED ORDER — ACETAMINOPHEN 325 MG PO TABS: 650.0000 mg | ORAL_TABLET | Freq: Four times a day (QID) | ORAL | Status: DC | PRN

## 2017-05-10 MED ORDER — MIDAZOLAM HCL 2 MG/2ML IJ SOLN
INTRAMUSCULAR | Status: AC
  Filled 2017-05-10: qty 2

## 2017-05-10 MED ORDER — ONDANSETRON HCL 4 MG/2ML IJ SOLN
INTRAMUSCULAR | Status: AC
  Filled 2017-05-10: qty 4

## 2017-05-10 MED ORDER — PROPOFOL 10 MG/ML IV BOLUS
INTRAVENOUS | Status: AC
  Filled 2017-05-10: qty 20

## 2017-05-10 MED ORDER — LACTATED RINGERS IV SOLN
INTRAVENOUS | Status: DC | PRN
  Administered 2017-05-10: 08:00:00 via INTRAVENOUS

## 2017-05-10 MED ORDER — DOCUSATE SODIUM 100 MG PO CAPS
100.0000 mg | ORAL_CAPSULE | Freq: Two times a day (BID) | ORAL | Status: DC
  Administered 2017-05-10 – 2017-05-12 (×4): 100 mg via ORAL
  Filled 2017-05-10 (×5): qty 1

## 2017-05-10 MED ORDER — PHENYLEPHRINE HCL 10 MG/ML IJ SOLN
INTRAVENOUS | Status: DC | PRN
  Administered 2017-05-10: 40 ug/min via INTRAVENOUS

## 2017-05-10 MED ORDER — CEFAZOLIN SODIUM-DEXTROSE 2-4 GM/100ML-% IV SOLN
2.0000 g | Freq: Four times a day (QID) | INTRAVENOUS | Status: AC
  Administered 2017-05-10 – 2017-05-13 (×10): 2 g via INTRAVENOUS
  Filled 2017-05-10 (×10): qty 100

## 2017-05-10 MED ORDER — POVIDONE-IODINE 10 % EX SWAB
2.0000 | Freq: Once | CUTANEOUS | Status: DC
Start: 2017-05-10 — End: 2017-05-10

## 2017-05-10 MED ORDER — FENTANYL CITRATE (PF) 100 MCG/2ML IJ SOLN
INTRAMUSCULAR | Status: DC | PRN
  Administered 2017-05-10: 50 ug via INTRAVENOUS
  Administered 2017-05-10: 100 ug via INTRAVENOUS
  Administered 2017-05-10: 50 ug via INTRAVENOUS

## 2017-05-10 MED ORDER — BISOPROLOL FUMARATE 5 MG PO TABS
10.0000 mg | ORAL_TABLET | Freq: Every day | ORAL | Status: DC
  Administered 2017-05-10: 10 mg via ORAL
  Filled 2017-05-10: qty 2

## 2017-05-10 MED ORDER — LIDOCAINE HCL (CARDIAC) 20 MG/ML IV SOLN
INTRAVENOUS | Status: DC | PRN
Start: 2017-05-10 — End: 2017-05-10
  Administered 2017-05-10: 60 mg via INTRAVENOUS

## 2017-05-10 MED ORDER — PROMETHAZINE HCL 25 MG/ML IJ SOLN: 6.2500 mg | INTRAMUSCULAR | Status: DC | PRN

## 2017-05-10 MED ORDER — CEFAZOLIN SODIUM-DEXTROSE 2-4 GM/100ML-% IV SOLN
2.0000 g | Freq: Four times a day (QID) | INTRAVENOUS | Status: DC
  Administered 2017-05-10: 2 g via INTRAVENOUS
  Filled 2017-05-10 (×4): qty 100

## 2017-05-10 MED ORDER — SODIUM CHLORIDE 0.9 % IV SOLN: INTRAVENOUS | Status: DC

## 2017-05-10 MED ORDER — HYDROMORPHONE HCL 1 MG/ML IJ SOLN
0.2500 mg | INTRAMUSCULAR | Status: DC | PRN
  Administered 2017-05-13 (×4): 0.5 mg via INTRAVENOUS

## 2017-05-10 MED ORDER — DOCUSATE SODIUM 100 MG PO CAPS: 100.0000 mg | ORAL_CAPSULE | Freq: Two times a day (BID) | ORAL | Status: DC

## 2017-05-10 MED ORDER — HYDROMORPHONE HCL 1 MG/ML IJ SOLN
1.0000 mg | INTRAMUSCULAR | Status: DC | PRN
Start: 2017-05-10 — End: 2017-05-13
  Administered 2017-05-10 – 2017-05-12 (×5): 1 mg via INTRAVENOUS
  Filled 2017-05-10 (×5): qty 1

## 2017-05-10 MED ORDER — TOPIRAMATE 100 MG PO TABS
50.0000 mg | ORAL_TABLET | Freq: Two times a day (BID) | ORAL | Status: DC
  Administered 2017-05-10 – 2017-05-12 (×5): 50 mg via ORAL
  Filled 2017-05-10 (×5): qty 1

## 2017-05-10 MED ORDER — DEXTROSE 5 % IV SOLN
INTRAVENOUS | Status: DC | PRN
  Administered 2017-05-10: 3 g via INTRAVENOUS

## 2017-05-10 MED ORDER — METHOCARBAMOL 500 MG PO TABS: 500.0000 mg | ORAL_TABLET | Freq: Four times a day (QID) | ORAL | Status: DC | PRN

## 2017-05-10 MED ORDER — MAGNESIUM CITRATE PO SOLN: 1.0000 | Freq: Once | ORAL | Status: DC | PRN

## 2017-05-10 MED ORDER — PREDNISONE 10 MG PO TABS
10.0000 mg | ORAL_TABLET | Freq: Every day | ORAL | Status: DC
  Administered 2017-05-10 – 2017-05-12 (×3): 10 mg via ORAL
  Filled 2017-05-10 (×3): qty 1

## 2017-05-10 MED ORDER — HYDROCHLOROTHIAZIDE 25 MG PO TABS
25.0000 mg | ORAL_TABLET | Freq: Every day | ORAL | Status: DC
  Administered 2017-05-10 – 2017-05-12 (×3): 25 mg via ORAL
  Filled 2017-05-10 (×3): qty 1

## 2017-05-10 MED ORDER — CHLORHEXIDINE GLUCONATE 4 % EX LIQD: 60.0000 mL | Freq: Once | CUTANEOUS | Status: DC

## 2017-05-10 MED ORDER — SODIUM CHLORIDE 0.9 % IR SOLN
Status: DC | PRN
  Administered 2017-05-10: 1000 mL
  Administered 2017-05-10 (×2): 3000 mL

## 2017-05-10 MED ORDER — WARFARIN SODIUM 5 MG PO TABS
10.0000 mg | ORAL_TABLET | Freq: Once | ORAL | Status: AC
  Administered 2017-05-10: 10 mg via ORAL
  Filled 2017-05-10: qty 2

## 2017-05-10 MED ORDER — SUCCINYLCHOLINE CHLORIDE 200 MG/10ML IV SOSY
PREFILLED_SYRINGE | INTRAVENOUS | Status: AC
  Filled 2017-05-10: qty 10

## 2017-05-10 MED ORDER — METOCLOPRAMIDE HCL 5 MG/ML IJ SOLN: 5.0000 mg | Freq: Three times a day (TID) | INTRAMUSCULAR | Status: DC | PRN

## 2017-05-10 MED ORDER — ONDANSETRON HCL 4 MG PO TABS: 4.0000 mg | ORAL_TABLET | Freq: Four times a day (QID) | ORAL | Status: DC | PRN

## 2017-05-10 MED ORDER — OXYCODONE HCL 5 MG/5ML PO SOLN: 5.0000 mg | Freq: Once | ORAL | Status: DC | PRN

## 2017-05-10 MED ORDER — HYDROMORPHONE HCL 1 MG/ML IJ SOLN: 1.0000 mg | INTRAMUSCULAR | Status: DC | PRN

## 2017-05-10 MED ORDER — WARFARIN - PHARMACIST DOSING INPATIENT
Freq: Every day | Status: DC
  Administered 2017-05-11: 18:00:00

## 2017-05-10 MED ORDER — FUROSEMIDE 20 MG PO TABS
20.0000 mg | ORAL_TABLET | ORAL | Status: DC
  Administered 2017-05-11: 20 mg via ORAL
  Filled 2017-05-10 (×2): qty 1

## 2017-05-10 MED ORDER — ACETAMINOPHEN 325 MG PO TABS
650.0000 mg | ORAL_TABLET | Freq: Four times a day (QID) | ORAL | Status: DC | PRN
  Administered 2017-05-11 – 2017-05-12 (×3): 650 mg via ORAL
  Filled 2017-05-10 (×3): qty 2

## 2017-05-10 MED ORDER — DEXAMETHASONE SODIUM PHOSPHATE 10 MG/ML IJ SOLN
INTRAMUSCULAR | Status: AC
  Filled 2017-05-10: qty 1

## 2017-05-10 MED ORDER — PRAVASTATIN SODIUM 10 MG PO TABS
20.0000 mg | ORAL_TABLET | Freq: Every day | ORAL | Status: DC
  Administered 2017-05-10 – 2017-05-12 (×3): 20 mg via ORAL
  Filled 2017-05-10 (×3): qty 2

## 2017-05-10 SURGICAL SUPPLY — 34 items
BLADE SURG 21 STRL SS (BLADE) ×3 IMPLANT
BNDG COHESIVE 6X5 TAN STRL LF (GAUZE/BANDAGES/DRESSINGS) ×6 IMPLANT
BNDG GAUZE ELAST 4 BULKY (GAUZE/BANDAGES/DRESSINGS) ×9 IMPLANT
COVER SURGICAL LIGHT HANDLE (MISCELLANEOUS) ×6 IMPLANT
DRAPE STERI IOBAN 125X83 (DRAPES) ×3 IMPLANT
DRAPE U-SHAPE 47X51 STRL (DRAPES) ×6 IMPLANT
DRSG ADAPTIC 3X8 NADH LF (GAUZE/BANDAGES/DRESSINGS) ×6 IMPLANT
DRSG PAD ABDOMINAL 8X10 ST (GAUZE/BANDAGES/DRESSINGS) ×15 IMPLANT
DURAPREP 26ML APPLICATOR (WOUND CARE) IMPLANT
ELECT REM PT RETURN 9FT ADLT (ELECTROSURGICAL) ×3
ELECTRODE REM PT RTRN 9FT ADLT (ELECTROSURGICAL) ×1 IMPLANT
GAUZE SPONGE 4X4 12PLY STRL (GAUZE/BANDAGES/DRESSINGS) ×9 IMPLANT
GLOVE BIOGEL PI IND STRL 9 (GLOVE) ×1 IMPLANT
GLOVE BIOGEL PI INDICATOR 9 (GLOVE) ×2
GLOVE SURG ORTHO 9.0 STRL STRW (GLOVE) ×3 IMPLANT
GOWN STRL REUS W/ TWL XL LVL3 (GOWN DISPOSABLE) ×2 IMPLANT
GOWN STRL REUS W/TWL XL LVL3 (GOWN DISPOSABLE) ×4
HANDPIECE INTERPULSE COAX TIP (DISPOSABLE) ×2
KIT BASIN OR (CUSTOM PROCEDURE TRAY) ×3 IMPLANT
KIT ROOM TURNOVER OR (KITS) ×3 IMPLANT
MANIFOLD NEPTUNE II (INSTRUMENTS) ×3 IMPLANT
NS IRRIG 1000ML POUR BTL (IV SOLUTION) ×3 IMPLANT
PACK ORTHO EXTREMITY (CUSTOM PROCEDURE TRAY) ×3 IMPLANT
PAD ARMBOARD 7.5X6 YLW CONV (MISCELLANEOUS) ×6 IMPLANT
SET HNDPC FAN SPRY TIP SCT (DISPOSABLE) ×1 IMPLANT
SPONGE LAP 18X18 X RAY DECT (DISPOSABLE) ×6 IMPLANT
STOCKINETTE IMPERVIOUS 9X36 MD (GAUZE/BANDAGES/DRESSINGS) ×3 IMPLANT
SUT ETHILON 2 0 PSLX (SUTURE) ×9 IMPLANT
SWAB COLLECTION DEVICE MRSA (MISCELLANEOUS) ×3 IMPLANT
SWAB CULTURE ESWAB REG 1ML (MISCELLANEOUS) IMPLANT
TOWEL OR 17X26 10 PK STRL BLUE (TOWEL DISPOSABLE) ×3 IMPLANT
TUBE CONNECTING 12'X1/4 (SUCTIONS) ×1
TUBE CONNECTING 12X1/4 (SUCTIONS) ×2 IMPLANT
YANKAUER SUCT BULB TIP NO VENT (SUCTIONS) ×3 IMPLANT

## 2017-05-10 NOTE — Transfer of Care (Signed)
Immediate Anesthesia Transfer of Care Note  Patient: Kee Drudge  Procedure(s) Performed: IRRIGATION AND DEBRIDEMENT THIGH , KNEE WOUND CLOSURE, APPLY VAC (Left Knee)  Patient Location: PACU  Anesthesia Type:General  Level of Consciousness: awake, alert , oriented and patient cooperative  Airway & Oxygen Therapy: Patient Spontanous Breathing and Patient connected to nasal cannula oxygen  Post-op Assessment: Report given to RN, Post -op Vital signs reviewed and stable, Patient moving all extremities and Patient moving all extremities X 4  Post vital signs: Reviewed and stable  Last Vitals:  Vitals:   05/09/17 2052 05/10/17 0608  BP: 109/80 (!) 121/53  Pulse: 90 94  Resp: 18 18  Temp: 37 C 37.4 C  SpO2: 95% 95%    Last Pain:  Vitals:   05/10/17 0715  TempSrc:   PainSc: 1       Patients Stated Pain Goal: 2 (05/09/17 0629)  Complications: No apparent anesthesia complications

## 2017-05-10 NOTE — Discharge Instructions (Addendum)
Information on my medicine - Coumadin®   (Warfarin) ° °Why was Coumadin prescribed for you? °Coumadin was prescribed for you because you have a blood clot or a medical condition that can cause an increased risk of forming blood clots. Blood clots can cause serious health problems by blocking the flow of blood to the heart, lung, or brain. Coumadin can prevent harmful blood clots from forming. °As a reminder your indication for Coumadin is:   Blood Clot Prevention After Orthopedic Surgery ° °What test will check on my response to Coumadin? °While on Coumadin (warfarin) you will need to have an INR test regularly to ensure that your dose is keeping you in the desired range. The INR (international normalized ratio) number is calculated from the result of the laboratory test called prothrombin time (PT). ° °If an INR APPOINTMENT HAS NOT ALREADY BEEN MADE FOR YOU please schedule an appointment to have this lab work done by your health care provider within 7 days. °Your INR goal is usually a number between:  2 to 3 or your provider may give you a more narrow range like 2-2.5.  Ask your health care provider during an office visit what your goal INR is. ° °What  do you need to  know  About  COUMADIN? °Take Coumadin (warfarin) exactly as prescribed by your healthcare provider about the same time each day.  DO NOT stop taking without talking to the doctor who prescribed the medication.  Stopping without other blood clot prevention medication to take the place of Coumadin may increase your risk of developing a new clot or stroke.  Get refills before you run out. ° °What do you do if you miss a dose? °If you miss a dose, take it as soon as you remember on the same day then continue your regularly scheduled regimen the next day.  Do not take two doses of Coumadin at the same time. ° °Important Safety Information °A possible side effect of Coumadin (Warfarin) is an increased risk of bleeding. You should call your healthcare  provider right away if you experience any of the following: °? Bleeding from an injury or your nose that does not stop. °? Unusual colored urine (red or dark brown) or unusual colored stools (red or black). °? Unusual bruising for unknown reasons. °? A serious fall or if you hit your head (even if there is no bleeding). ° °Some foods or medicines interact with Coumadin® (warfarin) and might alter your response to warfarin. To help avoid this: °? Eat a balanced diet, maintaining a consistent amount of Vitamin K. °? Notify your provider about major diet changes you plan to make. °? Avoid alcohol or limit your intake to 1 drink for women and 2 drinks for men per day. °(1 drink is 5 oz. wine, 12 oz. beer, or 1.5 oz. liquor.) ° °Make sure that ANY health care provider who prescribes medication for you knows that you are taking Coumadin (warfarin).  Also make sure the healthcare provider who is monitoring your Coumadin knows when you have started a new medication including herbals and non-prescription products. ° °Coumadin® (Warfarin)  Major Drug Interactions  °Increased Warfarin Effect Decreased Warfarin Effect  °Alcohol (large quantities) °Antibiotics (esp. Septra/Bactrim, Flagyl, Cipro) °Amiodarone (Cordarone) °Aspirin (ASA) °Cimetidine (Tagamet) °Megestrol (Megace) °NSAIDs (ibuprofen, naproxen, etc.) °Piroxicam (Feldene) °Propafenone (Rythmol SR) °Propranolol (Inderal) °Isoniazid (INH) °Posaconazole (Noxafil) Barbiturates (Phenobarbital) °Carbamazepine (Tegretol) °Chlordiazepoxide (Librium) °Cholestyramine (Questran) °Griseofulvin °Oral Contraceptives °Rifampin °Sucralfate (Carafate) °Vitamin K  ° °Coumadin® (Warfarin) Major Herbal   Interactions  Increased Warfarin Effect Decreased Warfarin Effect  Garlic Ginseng Ginkgo biloba Coenzyme Q10 Green tea St. Johns wort    Coumadin (Warfarin) FOOD Interactions  Eat a consistent number of servings per week of foods HIGH in Vitamin K (1 serving =  cup)  Collards  (cooked, or boiled & drained) Kale (cooked, or boiled & drained) Mustard greens (cooked, or boiled & drained) Parsley *serving size only =  cup Spinach (cooked, or boiled & drained) Swiss chard (cooked, or boiled & drained) Turnip greens (cooked, or boiled & drained)  Eat a consistent number of servings per week of foods MEDIUM-HIGH in Vitamin K (1 serving = 1 cup)  Asparagus (cooked, or boiled & drained) Broccoli (cooked, boiled & drained, or raw & chopped) Brussel sprouts (cooked, or boiled & drained) *serving size only =  cup Lettuce, raw (green leaf, endive, romaine) Spinach, raw Turnip greens, raw & chopped   These websites have more information on Coumadin (warfarin):  http://www.king-russell.com/; https://www.hines.net/;   [facility] will arrange home health phone [phone] option 5,1,3,1 Case ID number [id number]

## 2017-05-10 NOTE — Anesthesia Procedure Notes (Signed)
Procedure Name: LMA Insertion Date/Time: 05/10/2017 7:55 AM Performed by: Coralee Rud Pre-anesthesia Checklist: Patient identified, Emergency Drugs available, Suction available and Patient being monitored Patient Re-evaluated:Patient Re-evaluated prior to induction Oxygen Delivery Method: Circle system utilized Preoxygenation: Pre-oxygenation with 100% oxygen Induction Type: IV induction LMA: LMA inserted LMA Size: 5.0
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
## 2017-05-10 NOTE — Progress Notes (Signed)
Patient requested to have continuous pulse ox removed.  States the probe and the box were driving him crazy.  I educated about the need for the monitoring for the first 24 hours after surgery but he insisted he did not need it.  Turned box off and removed probe per patient request.  Will continue to monitor patient.

## 2017-05-10 NOTE — Progress Notes (Signed)
Patient returned to 6n08, alert and oriented, mild pain to left leg. VSS, IV fluids infusing, left leg noted to have ace wrap with no drainage. Family at bedside, will continue to monitor.

## 2017-05-10 NOTE — Anesthesia Postprocedure Evaluation (Signed)
Anesthesia Post Note  Patient: Bill [name]  Procedure(s) Performed: IRRIGATION AND DEBRIDEMENT THIGH , KNEE WOUND CLOSURE, APPLY VAC (Left Knee)     Patient location during evaluation: PACU Anesthesia Type: General Level of consciousness: awake and alert Pain management: pain level controlled Vital Signs Assessment: post-procedure vital signs reviewed and stable Respiratory status: spontaneous breathing, nonlabored ventilation, respiratory function stable and patient connected to nasal cannula oxygen Cardiovascular status: blood pressure returned to baseline and stable Postop Assessment: no apparent nausea or vomiting Anesthetic complications: no    Last Vitals:  Vitals:   05/10/17 1000 05/10/17 1039  BP: (!) 128/49 (!) 117/58  Pulse: 85 69  Resp: 12   Temp: (!) 36.2 C 36.7 C  SpO2: 95% 92%    Last Pain:  Vitals:   05/10/17 1324  TempSrc:   PainSc: 7                  Iliana Hutt S

## 2017-05-10 NOTE — Anesthesia Preprocedure Evaluation (Signed)
Anesthesia Evaluation  Patient identified by MRN, date of birth, ID band Patient awake    Reviewed: Allergy & Precautions, H&P , NPO status , Patient's Chart, lab work & pertinent test results  Airway Mallampati: II   Neck ROM: full    Dental   Pulmonary former smoker,    breath sounds clear to auscultation       Cardiovascular hypertension, + Peripheral Vascular Disease   Rhythm:regular Rate:Normal  H/o aortic dissection   Neuro/Psych    GI/Hepatic   Endo/Other  Morbid obesity  Renal/GU      Musculoskeletal   Abdominal   Peds  Hematology   Anesthesia Other Findings   Reproductive/Obstetrics                             Anesthesia Physical Anesthesia Plan  ASA: III  Anesthesia Plan: General   Post-op Pain Management:    Induction: Intravenous  PONV Risk Score and Plan: 2 and Ondansetron, Dexamethasone, Midazolam and Treatment may vary due to age or medical condition  Airway Management Planned: LMA  Additional Equipment:   Intra-op Plan:   Post-operative Plan:   Informed Consent: I have reviewed the patients History and Physical, chart, labs and discussed the procedure including the risks, benefits and alternatives for the proposed anesthesia with the patient or authorized representative who has indicated his/her understanding and acceptance.     Plan Discussed with: CRNA, Anesthesiologist and Surgeon  Anesthesia Plan Comments:         Anesthesia Quick Evaluation

## 2017-05-10 NOTE — Progress Notes (Signed)
Call to [facility] Sel[facility]) - no availability on 5N at this time, pt to remain on 6N where he was pre-op.

## 2017-05-11 ENCOUNTER — Encounter (HOSPITAL_COMMUNITY): Payer: Self-pay | Admitting: Orthopedic Surgery

## 2017-05-11 ENCOUNTER — Other Ambulatory Visit (INDEPENDENT_AMBULATORY_CARE_PROVIDER_SITE_OTHER): Payer: Self-pay | Admitting: Family

## 2017-05-11 LAB — CBC
HEMATOCRIT: 31.2 % — AB (ref 39.0–52.0)
Hemoglobin: 10.1 g/dL — ABNORMAL LOW (ref 13.0–17.0)
MCH: 29.1 pg (ref 26.0–34.0)
MCHC: 32.4 g/dL (ref 30.0–36.0)
MCV: 89.9 fL (ref 78.0–100.0)
PLATELETS: 202 10*3/uL (ref 150–400)
RBC: 3.47 MIL/uL — AB (ref 4.22–5.81)
RDW: 14.4 % (ref 11.5–15.5)
WBC: 8.7 10*3/uL (ref 4.0–10.5)

## 2017-05-11 LAB — BASIC METABOLIC PANEL
ANION GAP: 9 (ref 5–15)
BUN: 15 mg/dL (ref 6–20)
CALCIUM: 8.5 mg/dL — AB (ref 8.9–10.3)
CO2: 29 mmol/L (ref 22–32)
Chloride: 98 mmol/L — ABNORMAL LOW (ref 101–111)
Creatinine, Ser: 1.04 mg/dL (ref 0.61–1.24)
GFR calc non Af Amer: >60 mL/min (ref >60)
Glucose, Bld: 164 mg/dL — ABNORMAL HIGH (ref 65–99)
POTASSIUM: 3.4 mmol/L — AB (ref 3.5–5.1)
Sodium: 136 mmol/L (ref 135–145)

## 2017-05-11 LAB — PROTIME-INR
INR: 1.08
Prothrombin Time: 13.9 seconds (ref 11.4–15.2)

## 2017-05-11 MED ORDER — WARFARIN SODIUM 5 MG PO TABS
7.5000 mg | ORAL_TABLET | Freq: Once | ORAL | Status: AC
  Administered 2017-05-11: 7.5 mg via ORAL
  Filled 2017-05-11: qty 2

## 2017-05-11 MED ORDER — COUMADIN BOOK
Freq: Once | Status: AC
  Administered 2017-05-11: 12:00:00
  Filled 2017-05-11: qty 1

## 2017-05-11 MED ORDER — WARFARIN VIDEO
Freq: Once | Status: AC
  Administered 2017-05-11: 15:00:00

## 2017-05-11 MED ORDER — BISOPROLOL FUMARATE 5 MG PO TABS
10.0000 mg | ORAL_TABLET | ORAL | Status: DC
  Administered 2017-05-11 – 2017-05-14 (×3): 10 mg via ORAL
  Filled 2017-05-11 (×4): qty 2

## 2017-05-11 MED ORDER — LOSARTAN POTASSIUM 50 MG PO TABS
100.0000 mg | ORAL_TABLET | Freq: Every day | ORAL | Status: DC
  Administered 2017-05-11 – 2017-05-12 (×2): 100 mg via ORAL
  Filled 2017-05-11 (×2): qty 2
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
## 2017-05-11 NOTE — Care Management Note (Signed)
Case Management Note  Patient Details  Name: Bill [name] MRN: [id number] Date of Birth: 1965-08-26  Subjective/Objective:                    Action/Plan:  Explained [facility] to patient and wife.   Called HHPT referral in to [facility] [phone] requested HHPT Case ID number [id number]   [facility] aware HHRN may be added.   Faxed referral in to 1 [phone]  Expected Discharge Date:                  Expected Discharge Plan:  Home w Home Health Services  In-House Referral:     Discharge planning Services  CM Consult  Post Acute Care Choice:    Choice offered to:  Patient, Spouse  DME Arranged:    DME Agency:     HH Arranged:  PT HH Agency:     Status of Service:  In process, will continue to follow  If discussed at Long Length of Stay Meetings, dates discussed:    Additional Comments:  [name], RN 05/11/2017, 4:02 PM
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
## 2017-05-12 ENCOUNTER — Telehealth (INDEPENDENT_AMBULATORY_CARE_PROVIDER_SITE_OTHER): Payer: Self-pay | Admitting: *Deleted

## 2017-05-12 ENCOUNTER — Telehealth (INDEPENDENT_AMBULATORY_CARE_PROVIDER_SITE_OTHER): Payer: Self-pay | Admitting: Orthopedic Surgery

## 2017-05-12 LAB — PROTIME-INR
INR: 1.78
PROTHROMBIN TIME: 20.6 s — AB (ref 11.4–15.2)

## 2017-05-12 MED ORDER — PREDNISONE 10 MG PO TABS
10.0000 mg | ORAL_TABLET | Freq: Every day | ORAL | Status: DC
  Administered 2017-05-13 – 2017-05-15 (×3): 10 mg via ORAL
  Filled 2017-05-12 (×3): qty 1

## 2017-05-12 MED ORDER — CHLORHEXIDINE GLUCONATE 4 % EX LIQD
60.0000 mL | Freq: Once | CUTANEOUS | Status: AC
  Administered 2017-05-13: 4 via TOPICAL
  Filled 2017-05-12: qty 60

## 2017-05-12 MED ORDER — EZETIMIBE 10 MG PO TABS
10.0000 mg | ORAL_TABLET | Freq: Every day | ORAL | Status: DC
  Administered 2017-05-13 – 2017-05-15 (×3): 10 mg via ORAL
  Filled 2017-05-12 (×3): qty 1

## 2017-05-12 MED ORDER — PRAVASTATIN SODIUM 10 MG PO TABS
20.0000 mg | ORAL_TABLET | Freq: Every day | ORAL | Status: DC
  Administered 2017-05-13 – 2017-05-15 (×3): 20 mg via ORAL
  Filled 2017-05-12 (×3): qty 2

## 2017-05-12 MED ORDER — HYDROCHLOROTHIAZIDE 25 MG PO TABS
25.0000 mg | ORAL_TABLET | Freq: Every day | ORAL | Status: DC
  Administered 2017-05-13 – 2017-05-15 (×3): 25 mg via ORAL
  Filled 2017-05-12 (×3): qty 1

## 2017-05-12 MED ORDER — LOSARTAN POTASSIUM 50 MG PO TABS
100.0000 mg | ORAL_TABLET | Freq: Every day | ORAL | Status: DC
  Administered 2017-05-13 – 2017-05-15 (×3): 100 mg via ORAL
  Filled 2017-05-12 (×3): qty 2

## 2017-05-12 MED ORDER — TOPIRAMATE 100 MG PO TABS
50.0000 mg | ORAL_TABLET | Freq: Two times a day (BID) | ORAL | Status: DC
  Administered 2017-05-12 – 2017-05-15 (×6): 50 mg via ORAL
  Filled 2017-05-12 (×6): qty 1

## 2017-05-12 MED ORDER — DOCUSATE SODIUM 100 MG PO CAPS
100.0000 mg | ORAL_CAPSULE | Freq: Two times a day (BID) | ORAL | Status: DC
  Administered 2017-05-12: 100 mg via ORAL
  Filled 2017-05-12 (×2): qty 1

## 2017-05-12 MED ORDER — CEFAZOLIN SODIUM 10 G IJ SOLR
3.0000 g | INTRAMUSCULAR | Status: AC
  Administered 2017-05-13: 3 g via INTRAVENOUS
  Filled 2017-05-12: qty 3000

## 2017-05-12 NOTE — Progress Notes (Signed)
Received call from MD and he gave me a verbal order over the phone to remove old dressing and replace with new ABD,gauze, and ace wrap. MD Lajoyce Corners also spoke with patient's wife over the phone after I spoke with him. Will re-dress wound and continue to monitor.

## 2017-05-12 NOTE — Progress Notes (Signed)
Bandage on Left leg has moderate drainage present on posterior side.  Patient complains of bandage being uncomfortable because of bunching up.  Nurse paged MD Lajoyce Corners to change bandage and reassess wound due to drainage.

## 2017-05-12 NOTE — Progress Notes (Signed)
No response from my earlier page so called MD's office and asked if they could page him, they said they would pass along the message and that asked for a call back number. Reinforcing dressing. Awaiting call back.
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
## 2017-05-12 NOTE — Telephone Encounter (Signed)
I saw patient this morning and he is scheduled for surgery tomorrow.

## 2017-05-12 NOTE — Telephone Encounter (Signed)
I called and spoke with Bill [name] and Dr. Lajoyce Corners has also spoken with Bill [name], he did see patient this morning and speak with family today.

## 2017-05-12 NOTE — Progress Notes (Signed)
Left thigh dressing that was placed at 4pm, already sliding down and having moderate drainage. Reinforced dressing and placed new gauze under the ace wrap. Will continue to monitor.

## 2017-05-12 NOTE — Telephone Encounter (Signed)
Received call from Shanon Brow at [facility] calling on this pt stating Iona Hansen called earlier stating she has not heard back from Doctor and that family is upset and concerned and would like to just hear from him. Please call Iona Hansen back [phone].

## 2017-05-12 NOTE — Telephone Encounter (Signed)
I called [facility] and had Dr. Lajoyce Corners speak with her and family.

## 2017-05-12 NOTE — Progress Notes (Signed)
Patient ID: Bill [name], male   DOB: 18-Mar-1966, 51 y.o.   MRN: [id number] Patient without complaints this morning. Plan for repeat irrigation and debridement for massive wound left thigh. Possible discharge on Friday if everything looks good tomorrow. Nothing by mouth after midnight.

## 2017-05-12 NOTE — Progress Notes (Signed)
Patient and wife upset about patient's dressing not being changed this morning when the MD rounded on him, will call MD and ask him to address. Also they are requesting to have all his BP meds switched to an earlier time where he would get them before physical therapy worked with him. Will reach out to pharmacy and ask them to re-adjust times.

## 2017-05-12 NOTE — Telephone Encounter (Signed)
Iona Hansen from [facility] called stating that the patients bandage is coming loose and there is a lot of drainage and the patient is requesting Dr. Lajoyce Corners come look at it. CB # [phone]

## 2017-05-12 NOTE — Care Management (Signed)
Home health agency will be [facility] [phone]. Spoke with Bonita Quin at [facility]. If patient discharges Friday start of care date for HHPT will be Monday May 18, 2017 .   If [facility] is ordered it will have to be approved through [facility] and if patient discharged on Friday start of care date for [facility] would be Saturday May 16, 2017 .  Ronny Flurry RN BSN

## 2017-05-12 NOTE — Progress Notes (Signed)
ANTICOAGULATION CONSULT NOTE - Follow Up Consult  Pharmacy Consult for Coumadin Indication: VTE prophylaxis  No Known Allergies  Patient Measurements: Height:  (185.4 cm) Weight: (!) 356 lb 8 oz (161.7 kg) IBW/kg (Calculated) : 79.9  Vital Signs: Temp: 97.9 F (36.6 C) (10/16 0431) Temp Source: Oral (10/16 0431) BP: 135/103 (10/16 1020) Pulse Rate: 61 (10/16 0431)  Labs:  Recent Labs  05/10/17 0810 05/10/17 1106 05/11/17 0504 05/12/17 0633  HGB 10.3* 10.7* 10.1*  --   HCT 32.5* 33.3* 31.2*  --   PLT 173 169 202  --   LABPROT  --   --  13.9 20.6*  INR  --   --  1.08 1.78  CREATININE 1.16  --  1.04  --     Estimated Creatinine Clearance: 133.8 mL/min (by C-G formula based on SCr of 1.04 mg/dL).  Assessment:   Anticoag: Now to start Coumadin for VTE prophylaxis s/p Ortho surgery. Not on any anticoag PTA. INR 1.08>>1.78 today (after 2 doses). Hgb 10.1   Goal of Therapy:  INR 2-3 Monitor platelets by anticoagulation protocol: Yes   Plan:  Hold Coumadin tonight Daily INR   Darik Massing S. Merilynn Finland, PharmD, BCPS Clinical Staff Pharmacist Pager [phone]  Misty Stanley Stillinger 05/12/2017,1:55 PM

## 2017-05-13 ENCOUNTER — Encounter (HOSPITAL_COMMUNITY): Payer: Self-pay | Admitting: Certified Registered Nurse Anesthetist

## 2017-05-13 ENCOUNTER — Inpatient Hospital Stay (HOSPITAL_COMMUNITY): Payer: Managed Care, Other (non HMO) | Admitting: Certified Registered Nurse Anesthetist

## 2017-05-13 ENCOUNTER — Inpatient Hospital Stay (HOSPITAL_COMMUNITY)
Admission: EM | Disposition: A | Discharge status: Home Health | Payer: Self-pay | Source: Home / Self Care | Attending: Orthopedic Surgery

## 2017-05-13 HISTORY — PX: I & D EXTREMITY: SHX5045

## 2017-05-13 LAB — CBC
HEMATOCRIT: 31.3 % — AB (ref 39.0–52.0)
Hemoglobin: 10.1 g/dL — ABNORMAL LOW (ref 13.0–17.0)
MCH: 29.1 pg (ref 26.0–34.0)
MCHC: 32.3 g/dL (ref 30.0–36.0)
MCV: 90.2 fL (ref 78.0–100.0)
Platelets: 216 10*3/uL (ref 150–400)
RBC: 3.47 MIL/uL — ABNORMAL LOW (ref 4.22–5.81)
RDW: 14.5 % (ref 11.5–15.5)
WBC: 5.8 10*3/uL (ref 4.0–10.5)

## 2017-05-13 LAB — PROTIME-INR
INR: 1.64
Prothrombin Time: 19.3 seconds — ABNORMAL HIGH (ref 11.4–15.2)

## 2017-05-13 SURGERY — IRRIGATION AND DEBRIDEMENT EXTREMITY
Anesthesia: General | Laterality: Left

## 2017-05-13 MED ORDER — METHOCARBAMOL 500 MG PO TABS: 500.0000 mg | ORAL_TABLET | Freq: Four times a day (QID) | ORAL | Status: DC | PRN

## 2017-05-13 MED ORDER — LACTATED RINGERS IV SOLN: INTRAVENOUS | Status: DC

## 2017-05-13 MED ORDER — METOCLOPRAMIDE HCL 5 MG/ML IJ SOLN: 5.0000 mg | Freq: Three times a day (TID) | INTRAMUSCULAR | Status: DC | PRN

## 2017-05-13 MED ORDER — ONDANSETRON HCL 4 MG/2ML IJ SOLN: 4.0000 mg | Freq: Once | INTRAMUSCULAR | Status: DC | PRN

## 2017-05-13 MED ORDER — PHENYLEPHRINE 40 MCG/ML (10ML) SYRINGE FOR IV PUSH (FOR BLOOD PRESSURE SUPPORT)
PREFILLED_SYRINGE | INTRAVENOUS | Status: AC
  Filled 2017-05-13: qty 10

## 2017-05-13 MED ORDER — SODIUM CHLORIDE 0.9 % IR SOLN
Status: DC | PRN
  Administered 2017-05-13: 3000 mL

## 2017-05-13 MED ORDER — FENTANYL CITRATE (PF) 250 MCG/5ML IJ SOLN
INTRAMUSCULAR | Status: AC
  Filled 2017-05-13: qty 5

## 2017-05-13 MED ORDER — MIDAZOLAM HCL 2 MG/2ML IJ SOLN
INTRAMUSCULAR | Status: AC
  Filled 2017-05-13: qty 2

## 2017-05-13 MED ORDER — HYDROMORPHONE HCL 1 MG/ML IJ SOLN
INTRAMUSCULAR | Status: AC
  Administered 2017-05-13: 0.5 mg via INTRAVENOUS
  Filled 2017-05-13: qty 1

## 2017-05-13 MED ORDER — METOCLOPRAMIDE HCL 5 MG PO TABS: 5.0000 mg | ORAL_TABLET | Freq: Three times a day (TID) | ORAL | Status: DC | PRN

## 2017-05-13 MED ORDER — ONDANSETRON HCL 4 MG/2ML IJ SOLN
INTRAMUSCULAR | Status: DC | PRN
  Administered 2017-05-13: 4 mg via INTRAVENOUS

## 2017-05-13 MED ORDER — ROCURONIUM BROMIDE 50 MG/5ML IV SOLN
INTRAVENOUS | Status: AC
  Filled 2017-05-13: qty 1

## 2017-05-13 MED ORDER — OXYCODONE HCL 5 MG PO TABS: 5.0000 mg | ORAL_TABLET | Freq: Once | ORAL | Status: DC | PRN

## 2017-05-13 MED ORDER — DOCUSATE SODIUM 100 MG PO CAPS
100.0000 mg | ORAL_CAPSULE | Freq: Two times a day (BID) | ORAL | Status: DC
  Administered 2017-05-14 – 2017-05-15 (×2): 100 mg via ORAL
  Filled 2017-05-13 (×2): qty 1

## 2017-05-13 MED ORDER — PROPOFOL 10 MG/ML IV BOLUS
INTRAVENOUS | Status: DC | PRN
  Administered 2017-05-13: 200 mg via INTRAVENOUS

## 2017-05-13 MED ORDER — ONDANSETRON HCL 4 MG/2ML IJ SOLN
INTRAMUSCULAR | Status: AC
  Filled 2017-05-13: qty 2

## 2017-05-13 MED ORDER — BISACODYL 10 MG RE SUPP: 10.0000 mg | Freq: Every day | RECTAL | Status: DC | PRN

## 2017-05-13 MED ORDER — 0.9 % SODIUM CHLORIDE (POUR BTL) OPTIME
TOPICAL | Status: DC | PRN
  Administered 2017-05-13: 1000 mL

## 2017-05-13 MED ORDER — HYDROMORPHONE HCL 1 MG/ML IJ SOLN
1.0000 mg | INTRAMUSCULAR | Status: DC | PRN
  Administered 2017-05-13: 1 mg via INTRAVENOUS
  Filled 2017-05-13: qty 1

## 2017-05-13 MED ORDER — SUCCINYLCHOLINE CHLORIDE 20 MG/ML IJ SOLN
INTRAMUSCULAR | Status: DC | PRN
  Administered 2017-05-13: 200 mg via INTRAVENOUS

## 2017-05-13 MED ORDER — LIDOCAINE HCL (CARDIAC) 20 MG/ML IV SOLN
INTRAVENOUS | Status: DC | PRN
  Administered 2017-05-13: 80 mg via INTRAVENOUS

## 2017-05-13 MED ORDER — DEXAMETHASONE SODIUM PHOSPHATE 10 MG/ML IJ SOLN
INTRAMUSCULAR | Status: DC | PRN
  Administered 2017-05-13: 4 mg via INTRAVENOUS

## 2017-05-13 MED ORDER — LACTATED RINGERS IV SOLN
INTRAVENOUS | Status: DC
  Administered 2017-05-13 (×3): via INTRAVENOUS

## 2017-05-13 MED ORDER — OXYCODONE HCL 5 MG/5ML PO SOLN: 5.0000 mg | Freq: Once | ORAL | Status: DC | PRN

## 2017-05-13 MED ORDER — ONDANSETRON HCL 4 MG PO TABS: 4.0000 mg | ORAL_TABLET | Freq: Four times a day (QID) | ORAL | Status: DC | PRN

## 2017-05-13 MED ORDER — PROPOFOL 10 MG/ML IV BOLUS
INTRAVENOUS | Status: AC
  Filled 2017-05-13: qty 20

## 2017-05-13 MED ORDER — FENTANYL CITRATE (PF) 100 MCG/2ML IJ SOLN: 25.0000 ug | INTRAMUSCULAR | Status: DC | PRN

## 2017-05-13 MED ORDER — WARFARIN SODIUM 5 MG PO TABS
5.0000 mg | ORAL_TABLET | Freq: Once | ORAL | Status: AC
  Administered 2017-05-13: 5 mg via ORAL
  Filled 2017-05-13: qty 1

## 2017-05-13 MED ORDER — SODIUM CHLORIDE 0.9 % IV SOLN
INTRAVENOUS | Status: DC
  Administered 2017-05-13: 13:00:00 via INTRAVENOUS

## 2017-05-13 MED ORDER — MAGNESIUM CITRATE PO SOLN: 1.0000 | Freq: Once | ORAL | Status: DC | PRN

## 2017-05-13 MED ORDER — POLYETHYLENE GLYCOL 3350 17 G PO PACK: 17.0000 g | PACK | Freq: Every day | ORAL | Status: DC | PRN

## 2017-05-13 MED ORDER — ONDANSETRON HCL 4 MG/2ML IJ SOLN: 4.0000 mg | Freq: Four times a day (QID) | INTRAMUSCULAR | Status: DC | PRN

## 2017-05-13 MED ORDER — ACETAMINOPHEN 650 MG RE SUPP: 650.0000 mg | Freq: Four times a day (QID) | RECTAL | Status: DC | PRN

## 2017-05-13 MED ORDER — ACETAMINOPHEN 325 MG PO TABS
650.0000 mg | ORAL_TABLET | Freq: Four times a day (QID) | ORAL | Status: DC | PRN
  Administered 2017-05-13 – 2017-05-14 (×2): 650 mg via ORAL
  Filled 2017-05-13 (×2): qty 2

## 2017-05-13 MED ORDER — DEXTROSE 5 % IV SOLN
500.0000 mg | Freq: Four times a day (QID) | INTRAVENOUS | Status: DC | PRN
  Filled 2017-05-13: qty 5

## 2017-05-13 MED ORDER — MIDAZOLAM HCL 5 MG/5ML IJ SOLN
INTRAMUSCULAR | Status: DC | PRN
  Administered 2017-05-13: 2 mg via INTRAVENOUS

## 2017-05-13 MED ORDER — FENTANYL CITRATE (PF) 100 MCG/2ML IJ SOLN
INTRAMUSCULAR | Status: DC | PRN
  Administered 2017-05-13 (×2): 50 ug via INTRAVENOUS
  Administered 2017-05-13: 100 ug via INTRAVENOUS

## 2017-05-13 MED ORDER — OXYCODONE HCL 5 MG PO TABS
5.0000 mg | ORAL_TABLET | ORAL | Status: DC | PRN
  Administered 2017-05-13: 10 mg via ORAL
  Administered 2017-05-14 (×2): 5 mg via ORAL
  Administered 2017-05-14 – 2017-05-15 (×2): 10 mg via ORAL
  Filled 2017-05-13 (×2): qty 2
  Filled 2017-05-13: qty 1
  Filled 2017-05-13 (×2): qty 2

## 2017-05-13 MED ORDER — DEXAMETHASONE SODIUM PHOSPHATE 10 MG/ML IJ SOLN
INTRAMUSCULAR | Status: AC
  Filled 2017-05-13: qty 1

## 2017-05-13 SURGICAL SUPPLY — 34 items
BLADE SURG 21 STRL SS (BLADE) ×3 IMPLANT
BNDG COHESIVE 6X5 TAN STRL LF (GAUZE/BANDAGES/DRESSINGS) IMPLANT
BNDG GAUZE ELAST 4 BULKY (GAUZE/BANDAGES/DRESSINGS) IMPLANT
COVER SURGICAL LIGHT HANDLE (MISCELLANEOUS) ×3 IMPLANT
DRAPE U-SHAPE 47X51 STRL (DRAPES) ×3 IMPLANT
DRESSING PREVENA PLUS CUSTOM (GAUZE/BANDAGES/DRESSINGS) ×1 IMPLANT
DRSG ADAPTIC 3X8 NADH LF (GAUZE/BANDAGES/DRESSINGS) IMPLANT
DRSG PREVENA PLUS CUSTOM (GAUZE/BANDAGES/DRESSINGS) ×3
DURAPREP 26ML APPLICATOR (WOUND CARE) ×3 IMPLANT
ELECT REM PT RETURN 9FT ADLT (ELECTROSURGICAL)
ELECTRODE REM PT RTRN 9FT ADLT (ELECTROSURGICAL) IMPLANT
GAUZE SPONGE 4X4 12PLY STRL (GAUZE/BANDAGES/DRESSINGS) IMPLANT
GLOVE BIOGEL PI IND STRL 9 (GLOVE) ×1 IMPLANT
GLOVE BIOGEL PI INDICATOR 9 (GLOVE) ×2
GLOVE SURG ORTHO 9.0 STRL STRW (GLOVE) ×3 IMPLANT
GOWN STRL REUS W/ TWL XL LVL3 (GOWN DISPOSABLE) ×2 IMPLANT
GOWN STRL REUS W/TWL XL LVL3 (GOWN DISPOSABLE) ×4
HANDPIECE INTERPULSE COAX TIP (DISPOSABLE)
KIT BASIN OR (CUSTOM PROCEDURE TRAY) ×3 IMPLANT
KIT ROOM TURNOVER OR (KITS) ×3 IMPLANT
MANIFOLD NEPTUNE II (INSTRUMENTS) ×3 IMPLANT
NS IRRIG 1000ML POUR BTL (IV SOLUTION) ×3 IMPLANT
PACK ORTHO EXTREMITY (CUSTOM PROCEDURE TRAY) ×3 IMPLANT
PAD ARMBOARD 7.5X6 YLW CONV (MISCELLANEOUS) ×6 IMPLANT
SET HNDPC FAN SPRY TIP SCT (DISPOSABLE) IMPLANT
STOCKINETTE IMPERVIOUS 9X36 MD (GAUZE/BANDAGES/DRESSINGS) IMPLANT
SUT ETHILON 2 0 PSLX (SUTURE) ×18 IMPLANT
SWAB COLLECTION DEVICE MRSA (MISCELLANEOUS) ×3 IMPLANT
SWAB CULTURE ESWAB REG 1ML (MISCELLANEOUS) IMPLANT
TOWEL OR 17X26 10 PK STRL BLUE (TOWEL DISPOSABLE) ×3 IMPLANT
TUBE CONNECTING 12'X1/4 (SUCTIONS) ×1
TUBE CONNECTING 12X1/4 (SUCTIONS) ×2 IMPLANT
WND VAC CANISTER 500ML (MISCELLANEOUS) ×3 IMPLANT
YANKAUER SUCT BULB TIP NO VENT (SUCTIONS) ×3 IMPLANT

## 2017-05-13 NOTE — Interval H&P Note (Signed)
History and Physical Interval Note:  05/13/2017 7:58 AM  Bill [name]  has presented today for surgery, with the diagnosis of Wound left thigh  The various methods of treatment have been discussed with the patient and family. After consideration of risks, benefits and other options for treatment, the patient has consented to  Procedure(s): DEBRIDEMENT LEFT THIGH WOUND (Left) as a surgical intervention .  The patient's history has been reviewed, patient examined, no change in status, stable for surgery.  I have reviewed the patient's chart and labs.  Questions were answered to the patient's satisfaction.     Nadara MustardMarcus V Duda

## 2017-05-13 NOTE — Progress Notes (Signed)
Patient requested to take 02 off and continuous pulse ox. I told him we could remove it and monitor and if his oxygen levels dropped we would have to put it back on. Will continue to monitor.

## 2017-05-13 NOTE — Anesthesia Procedure Notes (Signed)
Procedure Name: Intubation Date/Time: 05/13/2017 10:34 AM Performed by: Rejeana Brock L Pre-anesthesia Checklist: Patient identified, Emergency Drugs available, Suction available and Patient being monitored Patient Re-evaluated:Patient Re-evaluated prior to induction Oxygen Delivery Method: Circle System Utilized Preoxygenation: Pre-oxygenation with 100% oxygen Induction Type: IV induction Ventilation: Mask ventilation without difficulty and Oral airway inserted - appropriate to patient size Laryngoscope Size: Mac and 4 Grade View: Grade I Tube type: Oral Tube size: 7.5 mm Number of attempts: 1 Airway Equipment and Method: Stylet and Oral airway Placement Confirmation: ETT inserted through vocal cords under direct vision,  positive ETCO2 and breath sounds checked- equal and bilateral Secured at: 22 cm Tube secured with: Tape Dental Injury: Teeth and Oropharynx as per pre-operative assessment

## 2017-05-13 NOTE — Anesthesia Postprocedure Evaluation (Signed)
Anesthesia Post Note  Patient: Bill MiyamotoJames William [name]  Procedure(s) Performed: DEBRIDEMENT LEFT THIGH WOUND (Left )     Patient location during evaluation: PACU Anesthesia Type: General Level of consciousness: awake, awake and alert and oriented Pain management: pain level controlled Vital Signs Assessment: post-procedure vital signs reviewed and stable Respiratory status: spontaneous breathing, nonlabored ventilation and respiratory function stable Cardiovascular status: blood pressure returned to baseline Postop Assessment: no headache Anesthetic complications: no    Last Vitals:  Vitals:   05/13/17 1936 05/13/17 2023  BP: (!) 81/64 (!) 116/58  Pulse: 78 73  Resp: 19   Temp: 36.8 C   SpO2: 98% 97%    Last Pain:  Vitals:   05/13/17 1954  TempSrc:   PainSc: 3                  [name]

## 2017-05-13 NOTE — Transfer of Care (Signed)
Immediate Anesthesia Transfer of Care Note  Patient: Bill MiyamotoJames William Pella  Procedure(s) Performed: DEBRIDEMENT LEFT THIGH WOUND (Left )  Patient Location: PACU  Anesthesia Type:General  Level of Consciousness: awake, alert  and oriented  Airway & Oxygen Therapy: Patient Spontanous Breathing and Patient connected to nasal cannula oxygen  Post-op Assessment: Report given to RN and Post -op Vital signs reviewed and stable  Post vital signs: Reviewed and stable  Last Vitals:  Vitals:   05/13/17 1128 05/13/17 1145  BP: 134/69 (!) 127/44  Pulse: 73 63  Resp: 12 12  Temp: 36.7 C   SpO2: 100% 98%    Last Pain:  Vitals:   05/13/17 1142  TempSrc:   PainSc: 9       Patients Stated Pain Goal: 2 (05/11/17 0430)  Complications: No apparent anesthesia complications

## 2017-05-13 NOTE — Progress Notes (Addendum)
Patient arrived from surgery.  A&Ox4.  VSS.  IV intact and infusing.  O2 at 2L/min via [place].  Patient noted to have wound vac and compression wrap left thigh/leg. Pain level stated at 7/10. Family at bedside on arrival.  Will continue to monitor.

## 2017-05-13 NOTE — Progress Notes (Signed)
Patient transported to surgery via bed.  Pre-op checklist complete.  IV saline locked.  Report called to short stay.  Family present at time of transfer.

## 2017-05-13 NOTE — Op Note (Signed)
05/07/2017 - 05/13/2017  11:13 AM  PATIENT:  Bill [name]    PRE-OPERATIVE DIAGNOSIS:  Wound left thigh  POST-OPERATIVE DIAGNOSIS:  Same  PROCEDURE:  DEBRIDEMENT LEFT THIGH WOUND,excision of skin soft tissue fat fascia and muscle.  SURGEON:  Nadara MustardMarcus V Indria Bishara, MD  PHYSICIAN ASSISTANT:None ANESTHESIA:   General  PREOPERATIVE INDICATIONS:  Bill [name] is a  51 y.o. male with a diagnosis of Wound left thigh who failed conservative measures and elected for surgical management.    The risks benefits and alternatives were discussed with the patient preoperatively including but not limited to the risks of infection, bleeding, nerve injury, cardiopulmonary complications, the need for revision surgery, among others, and the patient was willing to proceed.  OPERATIVE IMPLANTS: Prevena incisional wound VAC  OPERATIVE FINDINGS: improved tissue wound measurements 30 x 15 cm and 9 cm deep  OPERATIVE PROCEDURE: patient was brought to the operating room and underwent a general anesthetic. After adequate levels anesthesia were obtained patient's left lower extremity was prepped using Betadine paint and draped into a sterile field a timeout was called. The sutures were removed the large hematoma showed good improvement the wound edges were debrided back approximately a centimeters circumferentially around the wound as well as the base of the wound. Electrocautery was used for hemostasis. The muscle was viable contractile there was no necrotizing fasciitis. The wound edges were healthy and viable. After excision of skin soft tissue muscle fascia and fat with a Ronjair and 21 blade knife. Local tissue rearrangement was used to close the wound 30 x 15 cm with 2-0 nylon. A Prevena wound VAC was applied this had a good suction fit patient was extubated taken to the PACU in stable condition.

## 2017-05-13 NOTE — Anesthesia Preprocedure Evaluation (Signed)
Anesthesia Evaluation  Patient identified by MRN, date of birth, ID band Patient awake    Reviewed: Allergy & Precautions, NPO status , Patient's Chart, lab work & pertinent test results  Airway Mallampati: III  TM Distance: >3 FB Neck ROM: Full    Dental  (+) Teeth Intact, Dental Advisory Given   Pulmonary former smoker,    breath sounds clear to auscultation       Cardiovascular hypertension,  Rhythm:Regular Rate:Normal     Neuro/Psych    GI/Hepatic   Endo/Other    Renal/GU      Musculoskeletal   Abdominal (+) + obese,   Peds  Hematology   Anesthesia Other Findings   Reproductive/Obstetrics                             Anesthesia Physical Anesthesia Plan  ASA: III  Anesthesia Plan: General   Post-op Pain Management:    Induction: Intravenous, Rapid sequence and Cricoid pressure planned  PONV Risk Score and Plan: Ondansetron and Dexamethasone  Airway Management Planned: Oral ETT  Additional Equipment:   Intra-op Plan:   Post-operative Plan: Extubation in OR  Informed Consent: I have reviewed the patients History and Physical, chart, labs and discussed the procedure including the risks, benefits and alternatives for the proposed anesthesia with the patient or authorized representative who has indicated his/her understanding and acceptance.   Dental advisory given and History available from chart only  Plan Discussed with: CRNA and Anesthesiologist  Anesthesia Plan Comments:         Anesthesia Quick Evaluation

## 2017-05-13 NOTE — H&P (View-Only) (Signed)
Patient ID: Bill MiyamotoJames William [name], male   DOB: 05-05-1966, 51 y.o.   MRN: [id number] Patient without complaints this morning. Plan for repeat irrigation and debridement for massive wound left thigh. Possible discharge on Friday if everything looks good tomorrow. Nothing by mouth after midnight.

## 2017-05-13 NOTE — Progress Notes (Signed)
ANTICOAGULATION CONSULT NOTE - Follow Up Consult  Pharmacy Consult for Coumadin Indication: VTE prophylaxis  No Known Allergies  Patient Measurements: Height: 6' 0.99" (185.4 cm) Weight: (!) 356 lb 8 oz (161.7 kg) IBW/kg (Calculated) : 79.88  Vital Signs: Temp: 98 F (36.7 C) (10/17 1247) Temp Source: Oral (10/17 1247) BP: 137/47 (10/17 1247) Pulse Rate: 61 (10/17 1247)  Labs:  Recent Labs  05/11/17 0504 05/12/17 0633 05/13/17 0426  HGB 10.1*  --  10.1*  HCT 31.2*  --  31.3*  PLT 202  --  216  LABPROT 13.9 20.6* 19.3*  INR 1.08 1.78 1.64  CREATININE 1.04  --   --     Estimated Creatinine Clearance: 133.8 mL/min (by C-G formula based on SCr of 1.04 mg/dL).  Assessment: Warfarin held yesterday. Ortho surgery this morning. Patient to resume warfarin for VTE prophylaxis.  Anticoag: INR 1.64 today. Hgb 10.1   Goal of Therapy:  INR 2-3 Monitor platelets by anticoagulation protocol: Yes   Plan:  Give warfarin 5 mg PO tonight Daily INR Monitor for signs and symptoms of bleeding   Alyson InglesPaige Emmert Roethler  PharmD Candidate 05/13/2017,1:04 PM

## 2017-05-14 ENCOUNTER — Encounter (HOSPITAL_COMMUNITY): Payer: Self-pay | Admitting: Orthopedic Surgery

## 2017-05-14 LAB — PROTIME-INR
INR: 1.62
PROTHROMBIN TIME: 19.1 s — AB (ref 11.4–15.2)

## 2017-05-14 MED ORDER — WARFARIN SODIUM 5 MG PO TABS
7.5000 mg | ORAL_TABLET | Freq: Once | ORAL | Status: AC
  Administered 2017-05-14: 7.5 mg via ORAL
  Filled 2017-05-14: qty 2

## 2017-05-14 MED ORDER — CEFAZOLIN SODIUM-DEXTROSE 2-4 GM/100ML-% IV SOLN
2.0000 g | Freq: Three times a day (TID) | INTRAVENOUS | Status: DC
  Administered 2017-05-14 – 2017-05-15 (×4): 2 g via INTRAVENOUS
  Filled 2017-05-14 (×6): qty 100
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
## 2017-05-14 NOTE — Progress Notes (Signed)
ANTICOAGULATION CONSULT NOTE - Follow Up Consult  Pharmacy Consult for Coumadin Indication: VTE prophylaxis  No Known Allergies  Patient Measurements: Height: 6' 0.99" (185.4 cm) Weight: (!) 356 lb 8 oz (161.7 kg) IBW/kg (Calculated) : 79.88  Vital Signs: Temp: 98 F (36.7 C) (10/18 0437) Temp Source: Oral (10/18 0437) BP: 96/49 (10/18 1115) Pulse Rate: 74 (10/18 1115)  Labs:  Recent Labs  05/12/17 0633 05/13/17 0426 05/14/17 0524  HGB  --  10.1*  --   HCT  --  31.3*  --   PLT  --  216  --   LABPROT 20.6* 19.3* 19.1*  INR 1.78 1.64 1.62    Estimated Creatinine Clearance: 133.8 mL/min (by C-G formula based on SCr of 1.04 mg/dL).  Assessment: Warfarin held 10/16. Ortho surgery this morning. Patient resumed warfarin for VTE prophylaxis on 10/16.  Anticoag: INR 1.62 today. Hgb 10.1   Goal of Therapy:  INR 2-3 Monitor platelets by anticoagulation protocol: Yes   Plan:  Give warfarin 7.5 mg PO tonight Daily INR Monitor for signs and symptoms of bleeding   Alyson InglesPaige Bryton Waight  PharmD Candidate 05/14/2017,11:20 AM

## 2017-05-14 NOTE — Care Management Note (Addendum)
Case Management Note  Patient Details  Name: Bill [name] MRN: [id number] Date of Birth: 04-25-1966  Subjective/Objective:                    Action/Plan:  Clarified with Dr Lajoyce Cornersuda patient has incisional VAC .  Bedside nurse will call O.R. And request Prevena Plus Pump and be sure to plug it in for charge.   Also clarified with Dr Lajoyce Cornersuda only HHPT needed no HHRN needed. Called [facility] to update them left message .  Spoke with Bonita [facility]Linda at [facility] [phone] and faxed todays PT to her at [phone]. Linda aware discharge planned for tomorrow 05-15-17 . Start of care for home health PT will be Monday October 22  Discussed all of above with patient and wife. Both voiced understanding.   Patient agreeable to walker but not bedside commode    Expected Discharge Date:                  Expected Discharge Plan:  Home w Home Health Services  In-House Referral:     Discharge planning Services  CM Consult  Post Acute Care Choice:    Choice offered to:  Patient, Spouse  DME Arranged:    DME Agency:     HH Arranged:  PT HH Agency:     Status of Service:  In process, will continue to follow  If discussed at Long Length of Stay Meetings, dates discussed:    Additional Comments:  Kingsley PlanWile, Bill Chavana [name], RN 05/14/2017, 12:01 PM

## 2017-05-14 NOTE — Progress Notes (Signed)
Patient ID: Bill MiyamotoJames William [name], male   DOB: [birth year], 51 y.o.   MRN: [id number] Postoperative day 1 and repeat irrigation and debridement left thigh. There is minimal drainage and the wound VAC canister. Patient will continue antibiotics1 g IV every 8 hours. Physical therapy progressive ambulation anticipate discharge to home tomorrow.

## 2017-05-15 LAB — CBC
HCT: 31.7 % — ABNORMAL LOW (ref 39.0–52.0)
Hemoglobin: 10.3 g/dL — ABNORMAL LOW (ref 13.0–17.0)
MCH: 28.9 pg (ref 26.0–34.0)
MCHC: 32.5 g/dL (ref 30.0–36.0)
MCV: 89 fL (ref 78.0–100.0)
Platelets: 232 10*3/uL (ref 150–400)
RBC: 3.56 MIL/uL — ABNORMAL LOW (ref 4.22–5.81)
RDW: 14.5 % (ref 11.5–15.5)
WBC: 7.7 10*3/uL (ref 4.0–10.5)

## 2017-05-15 LAB — PROTIME-INR
INR: 1.78
PROTHROMBIN TIME: 20.5 s — AB (ref 11.4–15.2)

## 2017-05-15 MED ORDER — OXYCODONE-ACETAMINOPHEN 5-325 MG PO TABS: 1.0000 | ORAL_TABLET | ORAL | 0 refills | Status: DC | PRN

## 2017-05-15 MED ORDER — ASPIRIN EC 325 MG PO TBEC: 325.0000 mg | DELAYED_RELEASE_TABLET | Freq: Every day | ORAL | 0 refills | Status: DC

## 2017-05-15 MED ORDER — DOXYCYCLINE HYCLATE 100 MG PO TABS: 100.0000 mg | ORAL_TABLET | Freq: Two times a day (BID) | ORAL | 0 refills | Status: DC
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
## 2017-05-15 NOTE — Care Management (Signed)
[facility] [phone] and faxed discharge summary to her at [phone].   Linda aware discharge is today. Start of care date for HHPT is Monday October 22 , 2018  Ronny FlurryHeather Lakeria Starkman RN BSN [phone]

## 2017-05-18 ENCOUNTER — Encounter (INDEPENDENT_AMBULATORY_CARE_PROVIDER_SITE_OTHER): Payer: Self-pay | Admitting: Family

## 2017-05-18 ENCOUNTER — Ambulatory Visit (INDEPENDENT_AMBULATORY_CARE_PROVIDER_SITE_OTHER): Payer: Managed Care, Other (non HMO) | Admitting: Family

## 2017-05-18 ENCOUNTER — Telehealth (INDEPENDENT_AMBULATORY_CARE_PROVIDER_SITE_OTHER): Payer: Self-pay | Admitting: Radiology

## 2017-05-18 DIAGNOSIS — S71102D Unspecified open wound, left thigh, subsequent encounter: Secondary | ICD-10-CM

## 2017-05-18 NOTE — Telephone Encounter (Signed)
Wife called and is concerned that now patient has drainage when he has not had any since surgery.  Wound vac is in place still.  No fever, no increased pain.  Dressing is still intact as well from foot to upper thigh.  Please call her to discuss, see if they need to come in?  [place]

## 2017-05-18 NOTE — Telephone Encounter (Signed)
I called and spoke with patients wife. Advised we cannot determine infection over the phone. Needs in office examination if they are concerned. The purpose of the vac is to suction drainage, if the vac appears to be working and they have no other concerns, ok to wait. Since they have concerns about odor, needs to come in today.
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
## 2017-05-19 ENCOUNTER — Telehealth (INDEPENDENT_AMBULATORY_CARE_PROVIDER_SITE_OTHER): Payer: Self-pay | Admitting: Orthopedic Surgery

## 2017-05-19 ENCOUNTER — Telehealth (INDEPENDENT_AMBULATORY_CARE_PROVIDER_SITE_OTHER): Payer: Self-pay

## 2017-05-19 NOTE — Telephone Encounter (Signed)
Faxed to [phone] Kirke Shaggyattn Alicia.

## 2017-05-19 NOTE — Telephone Encounter (Signed)
Patient wife calling asking for Rx for wound care supplies Her cb# [phone]

## 2017-05-19 NOTE — Telephone Encounter (Signed)
Helmut Musterlicia from [employer] called saying that the patient is needing a RX for a wound care nurse. If it could be faxed to [phone]. Also if you have any questions you can contact her at [phone] ext: [phone]

## 2017-05-19 NOTE — Telephone Encounter (Signed)
I called and left her voicemail. Advised someone from [employer] called earlier today and order was faxed to them about wound care supplies. Advised that nothing fancy needs to be used. Patient is doing a dry dressing 1-2 times daily. Advised they could even use maxi pad and ace bandage is reusable.

## 2017-05-20 ENCOUNTER — Telehealth (INDEPENDENT_AMBULATORY_CARE_PROVIDER_SITE_OTHER): Payer: Self-pay | Admitting: Radiology

## 2017-05-20 NOTE — Telephone Encounter (Signed)
Helmut Musterlicia calling from [employer], wanting to get order for wound care supplies. Advised that 4x4 gauze and 6 inch ace bandage. Advised ace bandage is reusable, and even something like maxi pad could be used to absorb drainage. Dr. Lajoyce Cornersuda will speak with patient tomorrow about wound care. If home health is needed. I will send referral to [facility] third party vendor through [facility] ph [phone], fax [phone].

## 2017-05-21 ENCOUNTER — Telehealth (INDEPENDENT_AMBULATORY_CARE_PROVIDER_SITE_OTHER): Payer: Self-pay | Admitting: Orthopedic Surgery

## 2017-05-21 ENCOUNTER — Ambulatory Visit (INDEPENDENT_AMBULATORY_CARE_PROVIDER_SITE_OTHER): Payer: Managed Care, Other (non HMO) | Admitting: Orthopedic Surgery

## 2017-05-21 DIAGNOSIS — M726 Necrotizing fasciitis: Secondary | ICD-10-CM

## 2017-05-21 DIAGNOSIS — S71132D Puncture wound without foreign body, left thigh, subsequent encounter: Secondary | ICD-10-CM

## 2017-05-21 NOTE — Telephone Encounter (Addendum)
I apologized we were running behind in clinic this afternoon. Patient just seen this afternoon again. Advised dial soap cleansing and dry dressing changes daily, can change more frequently dependent on drainage. Order underneath letters. They can do whatever is allowable, as far as frequency. Daily if not home health nursing to assist 2-3 times a week for dressing change assistance and incisional checks. Most home health agencies will not cover this at all since it is not a skilled need. Order faxed to [phone] Left this on all voicemail for Juncoseresa.

## 2017-05-21 NOTE — Telephone Encounter (Signed)
Rosey Batheresa from [employer] [facility] called asking for the orders for the patient. CB # [phone] ext- [phone]
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
## 2017-05-22 ENCOUNTER — Telehealth (INDEPENDENT_AMBULATORY_CARE_PROVIDER_SITE_OTHER): Payer: Self-pay | Admitting: Orthopedic Surgery

## 2017-05-22 NOTE — Telephone Encounter (Signed)
apologized we were running behind in clinic this afternoon. Patient just seen this afternoon again. Advised dial soap cleansing and dry dressing changes daily, can change more frequently dependent on drainage. Order underneath letters. They can do whatever is allowable, as far as frequency. Daily if not home health nursing to assist 2-3 times a week for dressing change assistance and incisional checks. Most home health agencies will not cover this at all since it is not a skilled need. Order faxed to [phone] Left this on all voicemail for Richfielderesa. Form from [facility] was also faxed back with demographics info and diagnosis codes.

## 2017-05-22 NOTE — Telephone Encounter (Signed)
Alisha-Case Mgr called and wanted to know if he needed Home Health Care.Please call her to advise.

## 2017-05-25 ENCOUNTER — Telehealth (INDEPENDENT_AMBULATORY_CARE_PROVIDER_SITE_OTHER): Payer: Self-pay | Admitting: Radiology

## 2017-05-25 ENCOUNTER — Other Ambulatory Visit (INDEPENDENT_AMBULATORY_CARE_PROVIDER_SITE_OTHER): Payer: Self-pay | Admitting: Radiology

## 2017-05-25 DIAGNOSIS — M7989 Other specified soft tissue disorders: Principal | ICD-10-CM

## 2017-05-25 DIAGNOSIS — M79662 Pain in left lower leg: Secondary | ICD-10-CM

## 2017-05-25 NOTE — Telephone Encounter (Signed)
Patient calling today with case manager Helmut MusterAlicia. Status post thigh I&D and having increased swelling. He states because he was taking tylenol he stopped his aspirin and he increased his lasix.Marland Kitchen.Marland Kitchen.Marland Kitchen.He is having unilateral swelling, history of AAA, increased risk for clot. Had a long very thorough conversation with patient and per MD, needs doppler to r/o blood clot. [place]. [facility]. Eunice BlaseSkyped Sabrina, she is working on Occupational psychologistapproval.    The emergent number for approval fax: [phone], phone prior authorization [phone] [employer], his case manager gave me this number. His MRN [id number] Al CorpusJames William Juliann MuleMccauley, Alicia his case manager [phone] ext [phone].

## 2017-05-25 NOTE — Telephone Encounter (Signed)
Patient called, he was advised by Claris CheMargaret where his appt was and the ph number per your earlier message.  FYI to you in case you still need to fax order, or speak to patient.  You will need to call him back, we could not reach you when he called.  Hope what we told him was ok, thanks!!

## 2017-05-25 NOTE — Telephone Encounter (Signed)
[facility] [place] got pt scheduled for today 05/25/17 at 3:30pm, I C pt number listed left vm to return my call to advise of appt.   # to fax order is [phone], p- [phone] Once pt calls back I will fax over the order.

## 2017-05-27 NOTE — Telephone Encounter (Signed)
Noted and these notes were faxed that same day and again yesterday 05/26/17

## 2017-05-28 ENCOUNTER — Ambulatory Visit (INDEPENDENT_AMBULATORY_CARE_PROVIDER_SITE_OTHER): Payer: Managed Care, Other (non HMO) | Admitting: Orthopedic Surgery

## 2017-05-28 ENCOUNTER — Encounter (INDEPENDENT_AMBULATORY_CARE_PROVIDER_SITE_OTHER): Payer: Self-pay | Admitting: Orthopedic Surgery

## 2017-05-28 VITALS — Ht 72.0 in | Wt 356.0 lb

## 2017-05-28 DIAGNOSIS — S71132D Puncture wound without foreign body, left thigh, subsequent encounter: Secondary | ICD-10-CM
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
## 2017-05-30 ENCOUNTER — Encounter (HOSPITAL_COMMUNITY): Payer: Self-pay | Admitting: Internal Medicine

## 2017-05-30 ENCOUNTER — Inpatient Hospital Stay (HOSPITAL_COMMUNITY)
Admission: AD | Admit: 2017-05-30 | Discharge: 2017-06-03 | DRG: 602 | Disposition: A | Discharge status: Home Health | Payer: Managed Care, Other (non HMO) | Attending: Internal Medicine | Admitting: Internal Medicine

## 2017-05-30 DIAGNOSIS — G8929 Other chronic pain: Secondary | ICD-10-CM | POA: Diagnosis present

## 2017-05-30 DIAGNOSIS — M549 Dorsalgia, unspecified: Secondary | ICD-10-CM | POA: Diagnosis present

## 2017-05-30 DIAGNOSIS — Z6841 Body Mass Index (BMI) 40.0 and over, adult: Secondary | ICD-10-CM | POA: Diagnosis not present

## 2017-05-30 DIAGNOSIS — S71132D Puncture wound without foreign body, left thigh, subsequent encounter: Secondary | ICD-10-CM

## 2017-05-30 DIAGNOSIS — Z7952 Long term (current) use of systemic steroids: Secondary | ICD-10-CM

## 2017-05-30 DIAGNOSIS — Z79899 Other long term (current) drug therapy: Secondary | ICD-10-CM

## 2017-05-30 DIAGNOSIS — L03116 Cellulitis of left lower limb: Secondary | ICD-10-CM | POA: Diagnosis present

## 2017-05-30 DIAGNOSIS — S71139A Puncture wound without foreign body, unspecified thigh, initial encounter: Secondary | ICD-10-CM | POA: Diagnosis present

## 2017-05-30 DIAGNOSIS — I10 Essential (primary) hypertension: Secondary | ICD-10-CM | POA: Diagnosis present

## 2017-05-30 DIAGNOSIS — Z87891 Personal history of nicotine dependence: Secondary | ICD-10-CM

## 2017-05-30 DIAGNOSIS — M545 Low back pain: Secondary | ICD-10-CM | POA: Diagnosis not present

## 2017-05-30 DIAGNOSIS — S81009A Unspecified open wound, unspecified knee, initial encounter: Secondary | ICD-10-CM | POA: Diagnosis present

## 2017-05-30 DIAGNOSIS — N179 Acute kidney failure, unspecified: Secondary | ICD-10-CM | POA: Diagnosis present

## 2017-05-30 DIAGNOSIS — Z7982 Long term (current) use of aspirin: Secondary | ICD-10-CM | POA: Diagnosis not present

## 2017-05-30 DIAGNOSIS — I872 Venous insufficiency (chronic) (peripheral): Secondary | ICD-10-CM | POA: Diagnosis present

## 2017-05-30 DIAGNOSIS — I7100 Dissection of unspecified site of aorta: Secondary | ICD-10-CM | POA: Diagnosis not present

## 2017-05-30 DIAGNOSIS — M726 Necrotizing fasciitis: Secondary | ICD-10-CM | POA: Diagnosis present

## 2017-05-30 DIAGNOSIS — S71132A Puncture wound without foreign body, left thigh, initial encounter: Secondary | ICD-10-CM | POA: Diagnosis not present

## 2017-05-30 DIAGNOSIS — M79605 Pain in left leg: Secondary | ICD-10-CM | POA: Diagnosis present

## 2017-05-30 DIAGNOSIS — E785 Hyperlipidemia, unspecified: Secondary | ICD-10-CM | POA: Diagnosis present

## 2017-05-30 DIAGNOSIS — S81002A Unspecified open wound, left knee, initial encounter: Secondary | ICD-10-CM | POA: Diagnosis not present

## 2017-05-30 LAB — COMPREHENSIVE METABOLIC PANEL
ALT: 24 U/L (ref 17–63)
ANION GAP: 8 (ref 5–15)
AST: 21 U/L (ref 15–41)
Albumin: 3.1 g/dL — ABNORMAL LOW (ref 3.5–5.0)
Alkaline Phosphatase: 84 U/L (ref 38–126)
BUN: 20 mg/dL (ref 6–20)
CHLORIDE: 98 mmol/L — AB (ref 101–111)
CO2: 24 mmol/L (ref 22–32)
CREATININE: 1.41 mg/dL — AB (ref 0.61–1.24)
Calcium: 8.2 mg/dL — ABNORMAL LOW (ref 8.9–10.3)
GFR calc non Af Amer: 56 mL/min — ABNORMAL LOW (ref >60)
Glucose, Bld: 149 mg/dL — ABNORMAL HIGH (ref 65–99)
Potassium: 3.4 mmol/L — ABNORMAL LOW (ref 3.5–5.1)
SODIUM: 130 mmol/L — AB (ref 135–145)
Total Bilirubin: 0.9 mg/dL (ref 0.3–1.2)
Total Protein: 6 g/dL — ABNORMAL LOW (ref 6.5–8.1)

## 2017-05-30 LAB — APTT: APTT: 30 s (ref 24–36)

## 2017-05-30 LAB — PROTIME-INR
INR: 1.07
Prothrombin Time: 13.8 seconds (ref 11.4–15.2)

## 2017-05-30 LAB — PROCALCITONIN

## 2017-05-30 LAB — LACTIC ACID, PLASMA: Lactic Acid, Venous: 1.4 mmol/L (ref 0.5–1.9)

## 2017-05-30 LAB — CREATININE, URINE, RANDOM: Creatinine, Urine: 146.4 mg/dL

## 2017-05-30 MED ORDER — BISOPROLOL FUMARATE 10 MG PO TABS
10.0000 mg | ORAL_TABLET | Freq: Every day | ORAL | Status: DC
  Administered 2017-05-31 – 2017-06-03 (×4): 10 mg via ORAL
  Filled 2017-05-30 (×4): qty 1

## 2017-05-30 MED ORDER — VANCOMYCIN HCL 10 G IV SOLR
2500.0000 mg | Freq: Once | INTRAVENOUS | Status: AC
  Administered 2017-05-30: 2500 mg via INTRAVENOUS
  Filled 2017-05-30: qty 2500

## 2017-05-30 MED ORDER — ASPIRIN EC 325 MG PO TBEC
325.0000 mg | DELAYED_RELEASE_TABLET | Freq: Every day | ORAL | Status: DC
Start: 2017-05-31 — End: 2017-06-03
  Administered 2017-05-31 – 2017-06-03 (×4): 325 mg via ORAL
  Filled 2017-05-30 (×4): qty 1

## 2017-05-30 MED ORDER — HEPARIN SODIUM (PORCINE) 5000 UNIT/ML IJ SOLN
5000.0000 [IU] | Freq: Three times a day (TID) | INTRAMUSCULAR | Status: DC
  Administered 2017-05-30 – 2017-06-03 (×11): 5000 [IU] via SUBCUTANEOUS
  Filled 2017-05-30 (×11): qty 1

## 2017-05-30 MED ORDER — CLONIDINE HCL 0.2 MG PO TABS
0.2000 mg | ORAL_TABLET | Freq: Two times a day (BID) | ORAL | Status: DC
  Administered 2017-05-30 – 2017-06-03 (×5): 0.2 mg via ORAL
  Filled 2017-05-30 (×7): qty 1

## 2017-05-30 MED ORDER — SODIUM CHLORIDE 0.9 % IV SOLN
INTRAVENOUS | Status: DC
  Administered 2017-05-31 – 2017-06-02 (×3): via INTRAVENOUS

## 2017-05-30 MED ORDER — SODIUM CHLORIDE 0.9 % IV SOLN
1250.0000 mg | Freq: Two times a day (BID) | INTRAVENOUS | Status: DC
  Administered 2017-05-31 – 2017-06-03 (×6): 1250 mg via INTRAVENOUS
  Filled 2017-05-30 (×7): qty 1250

## 2017-05-30 MED ORDER — PREDNISONE 10 MG PO TABS
10.0000 mg | ORAL_TABLET | Freq: Every day | ORAL | Status: DC
  Administered 2017-05-31 – 2017-06-02 (×3): 10 mg via ORAL
  Filled 2017-05-30 (×4): qty 1

## 2017-05-30 MED ORDER — LOSARTAN POTASSIUM 50 MG PO TABS: 100.0000 mg | ORAL_TABLET | Freq: Every day | ORAL | Status: DC

## 2017-05-30 MED ORDER — ADULT MULTIVITAMIN W/MINERALS CH
1.0000 | ORAL_TABLET | Freq: Every day | ORAL | Status: DC
  Administered 2017-05-31 – 2017-06-03 (×4): 1 via ORAL
  Filled 2017-05-30 (×4): qty 1

## 2017-05-30 MED ORDER — PIPERACILLIN-TAZOBACTAM 3.375 G IVPB
3.3750 g | Freq: Three times a day (TID) | INTRAVENOUS | Status: DC
  Administered 2017-05-30 – 2017-06-03 (×9): 3.375 g via INTRAVENOUS
  Filled 2017-05-30 (×12): qty 50

## 2017-05-30 MED ORDER — HYDROCORTISONE NA SUCCINATE PF 100 MG IJ SOLR
50.0000 mg | Freq: Once | INTRAMUSCULAR | Status: AC
  Administered 2017-05-30: 50 mg via INTRAVENOUS
  Filled 2017-05-30: qty 1

## 2017-05-30 MED ORDER — PRAVASTATIN SODIUM 20 MG PO TABS
20.0000 mg | ORAL_TABLET | Freq: Every day | ORAL | Status: DC
  Administered 2017-05-31 – 2017-06-02 (×3): 20 mg via ORAL
  Filled 2017-05-30 (×3): qty 1

## 2017-05-30 MED ORDER — OXYCODONE-ACETAMINOPHEN 5-325 MG PO TABS
2.0000 | ORAL_TABLET | ORAL | Status: DC | PRN
  Administered 2017-05-31: 2 via ORAL
  Administered 2017-06-01 – 2017-06-02 (×3): 1 via ORAL
  Filled 2017-05-30 (×4): qty 2

## 2017-05-30 MED ORDER — HYDRALAZINE HCL 20 MG/ML IJ SOLN: 5.0000 mg | INTRAMUSCULAR | Status: DC | PRN

## 2017-05-30 MED ORDER — HYDROMORPHONE HCL 1 MG/ML IJ SOLN: 1.0000 mg | INTRAMUSCULAR | Status: DC | PRN

## 2017-05-30 MED ORDER — SODIUM CHLORIDE 0.9 % IV BOLUS (SEPSIS)
3000.0000 mL | Freq: Once | INTRAVENOUS | Status: AC
  Administered 2017-05-30: 3000 mL via INTRAVENOUS

## 2017-05-30 MED ORDER — TOPIRAMATE 25 MG PO TABS
50.0000 mg | ORAL_TABLET | Freq: Two times a day (BID) | ORAL | Status: DC
  Administered 2017-05-30 – 2017-06-03 (×8): 50 mg via ORAL
  Filled 2017-05-30 (×8): qty 2

## 2017-05-30 MED ORDER — ZOLPIDEM TARTRATE 5 MG PO TABS: 5.0000 mg | ORAL_TABLET | Freq: Every evening | ORAL | Status: DC | PRN

## 2017-05-30 MED ORDER — ONDANSETRON HCL 4 MG/2ML IJ SOLN: 4.0000 mg | Freq: Three times a day (TID) | INTRAMUSCULAR | Status: DC | PRN

## 2017-05-30 MED ORDER — ACETAMINOPHEN 325 MG PO TABS
650.0000 mg | ORAL_TABLET | Freq: Four times a day (QID) | ORAL | Status: DC | PRN
  Administered 2017-05-31: 650 mg via ORAL
  Filled 2017-05-30: qty 2
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
## 2017-05-31 ENCOUNTER — Encounter (HOSPITAL_COMMUNITY): Payer: Self-pay | Admitting: Orthopedic Surgery

## 2017-05-31 DIAGNOSIS — S71132A Puncture wound without foreign body, left thigh, initial encounter: Secondary | ICD-10-CM

## 2017-05-31 DIAGNOSIS — I7100 Dissection of unspecified site of aorta: Secondary | ICD-10-CM

## 2017-05-31 DIAGNOSIS — L03116 Cellulitis of left lower limb: Principal | ICD-10-CM

## 2017-05-31 DIAGNOSIS — I10 Essential (primary) hypertension: Secondary | ICD-10-CM

## 2017-05-31 DIAGNOSIS — M545 Low back pain: Secondary | ICD-10-CM

## 2017-05-31 DIAGNOSIS — S81002A Unspecified open wound, left knee, initial encounter: Secondary | ICD-10-CM

## 2017-05-31 DIAGNOSIS — M726 Necrotizing fasciitis: Secondary | ICD-10-CM

## 2017-05-31 DIAGNOSIS — N179 Acute kidney failure, unspecified: Secondary | ICD-10-CM

## 2017-05-31 LAB — BASIC METABOLIC PANEL
ANION GAP: 7 (ref 5–15)
BUN: 18 mg/dL (ref 6–20)
CHLORIDE: 106 mmol/L (ref 101–111)
CO2: 25 mmol/L (ref 22–32)
Calcium: 8.1 mg/dL — ABNORMAL LOW (ref 8.9–10.3)
Creatinine, Ser: 1.15 mg/dL (ref 0.61–1.24)
Glucose, Bld: 127 mg/dL — ABNORMAL HIGH (ref 65–99)
POTASSIUM: 3.7 mmol/L (ref 3.5–5.1)
SODIUM: 138 mmol/L (ref 135–145)

## 2017-05-31 LAB — CBC
HCT: 27.5 % — ABNORMAL LOW (ref 39.0–52.0)
HEMOGLOBIN: 8.6 g/dL — AB (ref 13.0–17.0)
MCH: 28.7 pg (ref 26.0–34.0)
MCHC: 31.3 g/dL (ref 30.0–36.0)
MCV: 91.7 fL (ref 78.0–100.0)
PLATELETS: 179 10*3/uL (ref 150–400)
RBC: 3 MIL/uL — AB (ref 4.22–5.81)
RDW: 15.1 % (ref 11.5–15.5)
WBC: 5.1 10*3/uL (ref 4.0–10.5)

## 2017-05-31 LAB — SEDIMENTATION RATE: SED RATE: 60 mm/h — AB (ref 0–16)

## 2017-05-31 LAB — LACTIC ACID, PLASMA: LACTIC ACID, VENOUS: 1.1 mmol/L (ref 0.5–1.9)

## 2017-05-31 LAB — HIV ANTIBODY (ROUTINE TESTING W REFLEX): HIV Screen 4th Generation wRfx: NONREACTIVE

## 2017-05-31 LAB — GLUCOSE, CAPILLARY: GLUCOSE-CAPILLARY: 113 mg/dL — AB (ref 65–99)

## 2017-05-31 LAB — CORTISOL-AM, BLOOD: Cortisol - AM: 2.8 ug/dL — ABNORMAL LOW (ref 6.7–22.6)

## 2017-05-31 LAB — C-REACTIVE PROTEIN: CRP: 7.1 mg/dL — AB (ref <1.0)
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
## 2017-05-31 NOTE — Progress Notes (Signed)
Dressing changed and rewrapped, Pt bathed and linens changed

## 2017-05-31 NOTE — Progress Notes (Signed)
Bill [name].    PROGRESS NOTE  Bill MiyamotoJames William [name] WJX:[id number] DOB: 11/19/1965 DOA: 05/30/2017 PCP: System, Pcp Not In  HPI/Recap of past 24 hours: Pt seen and examined with wife at his bedside. Wound at left thigh extending to left knee w drainage. Gen surgery following. Patient admits to intermittent achy pain 5/10 worse when touched or w weight bearing.  Assessment/Plan: Principal Problem:   Cellulitis of left leg Active Problems:   Open knee wound   Penetrating thigh wound   Necrotizing fasciitis of pelvic region and thigh (HCC)   Hypertension   Aortic dissection (HCC)   HLD (hyperlipidemia)   Back pain   AKI (acute kidney injury) (HCC)   Code Status: Full  Family Communication: With wife at bedside  Disposition Plan: Will stay another midnight to continue Iv antibiotics.   Consultants:  Gen surgery  Procedures:  none  Antimicrobials:  IV   DVT prophylaxis:  Heparin sq 5000 U TID   Objective: Vitals:   05/30/17 2003 05/30/17 2200 05/30/17 2211 05/31/17 0529  BP: (!) 108/37   (!) 106/46  Pulse: 79   73  Resp: 18  18   Temp: 100.2 F (37.9 C)  98.6 F (37 C) 98.9 F (37.2 C)  TempSrc: Oral   Oral  SpO2: 96%   97%  Weight:  (!) 188.2 kg (415 lb) (!) 205 kg (451 lb 15.1 oz)   Height:  6\' 1"  (1.854 m)      Intake/Output Summary (Last 24 hours) at 05/31/2017 1213 Last data filed at 05/31/2017 0900 Gross per 24 hour  Intake 3840 ml  Output 1550 ml  Net 2290 ml   Filed Weights   05/30/17 2200 05/30/17 2211  Weight: (!) 188.2 kg (415 lb) (!) 205 kg (451 lb 15.1 oz)    Exam: General: Not in acute distress HEENT:Eyes: PERRL, EOMI, no scleral icterus.ENT: No discharge from the ears and nose, no injection, no tonsillar enlargement. Neck: No JVD, no bruit, no mass felt. No neck lymph node enlargement. Cardiac: S1/S2, RRR, No murmurs, No gallops or rubs. Respiratory: Good air movement bilaterally. No rales, wheezing, rhonchi or rubs. GI: Soft,  nondistended, nontender, no rebound pain, no organomegaly, BS present. GU: No hematuria Ext: 2+pulse bilaterally. has a large wound in left thigh extending to left knee which is sutured up, with drainage, with some erythema around surgical site. Left lower leg is erythematous, warm and tender  Musculoskeletal: No joint deformities. Strength is intact Skin: No rashes.  Neuro: Alert, oriented X3, cranial nerves II-XII grossly intact, moves all extremities normally.   Data Reviewed: CBC: Recent Labs  Lab 05/31/17 0827  WBC 5.1  HGB 8.6*  HCT 27.5*  MCV 91.7  PLT 179   Basic Metabolic Panel: Recent Labs  Lab 05/30/17 2151 05/31/17 0459  NA 130* 138  K 3.4* 3.7  CL 98* 106  CO2 24 25  GLUCOSE 149* 127*  BUN 20 18  CREATININE 1.41* 1.15  CALCIUM 8.2* 8.1*   GFR: Estimated Creatinine Clearance: 139.6 mL/min (by C-G formula based on SCr of 1.15 mg/dL). Liver Function Tests: Recent Labs  Lab 05/30/17 2151  AST 21  ALT 24  ALKPHOS 84  BILITOT 0.9  PROT 6.0*  ALBUMIN 3.1*   No results for input(s): LIPASE, AMYLASE in the last 168 hours. No results for input(s): AMMONIA in the last 168 hours. Coagulation Profile: Recent Labs  Lab 05/30/17 2151  INR 1.07   Cardiac Enzymes: No results for input(s): CKTOTAL, CKMB,  CKMBINDEX, TROPONINI in the last 168 hours. BNP (last 3 results) No results for input(s): PROBNP in the last 8760 hours. HbA1C: No results for input(s): HGBA1C in the last 72 hours. CBG: Recent Labs  Lab 05/31/17 0655  GLUCAP 113*   Lipid Profile: No results for input(s): CHOL, HDL, LDLCALC, TRIG, CHOLHDL, LDLDIRECT in the last 72 hours. Thyroid Function Tests: No results for input(s): TSH, T4TOTAL, FREET4, T3FREE, THYROIDAB in the last 72 hours. Anemia Panel: No results for input(s): VITAMINB12, FOLATE, FERRITIN, TIBC, IRON, RETICCTPCT in the last 72 hours. Urine analysis: No results found for: COLORURINE, APPEARANCEUR, LABSPEC, PHURINE, GLUCOSEU,  HGBUR, BILIRUBINUR, KETONESUR, PROTEINUR, UROBILINOGEN, NITRITE, LEUKOCYTESUR Sepsis Labs: @LABRCNTIP (procalcitonin:4,lacticidven:4)  )No results found for this or any previous visit (from the past 240 hour(s)).    Studies: No results found.  Scheduled Meds: . aspirin EC  325 mg Oral Daily  . bisoprolol  10 mg Oral Daily  . cloNIDine  0.2 mg Oral BID  . heparin  5,000 Units Subcutaneous Q8H  . multivitamin with minerals  1 tablet Oral Daily  . pravastatin  20 mg Oral q1800  . predniSONE  10 mg Oral Daily  . topiramate  50 mg Oral BID    Continuous Infusions: . sodium chloride 125 mL/hr at 05/31/17 0400  . piperacillin-tazobactam (ZOSYN)  IV 3.375 g (05/31/17 0544)  . 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  -Afebrile. No leukocytosis. Clinically nonseptic. No DVT by venous doppler. -IV vancomycin and Zosyn empirically day 1 - PRN Zofran for nausea, dilaudid and Percocet for pain - Blood cultures x 2, results pending - wound care consult - Procalcitonin and trend lactic acid levels per sepsis protocol. - IVF: 3L of NS bolus in ED, followed by 125 cc/h - f/u ortho recommendations  Previous necrotizing fasciitis of pelvic region and thigh, Penetrating thigh wound and open knee wound:  - s/p of three irrigation debridements. It is sutured up, with some drainage. - f/u ortho recommendations -wound care consult  AKI:  - resolved - BMP am  Hypertension: -will hold Lasix, Cozaar, HCTZ due to recent acute renal injury, also because of high risk of developing sepsis and hypotension -Continue clonidine and Zebeta -IV hydralazine when necessary  Hx of aortic dissection (HCC):  -no symptoms. -c/w monitoring  HLD (hyperlipidemia): -Pravastatin  Back pain due to bulging disc:    -Prednisone 10 mg daily -Percocet prn      Darlin Drop, MD [facility] Pager [phone]  If 7PM-7AM, please contact night-coverage [email or web address] Password [email or web address] 05/31/2017, 12:13 PM

## 2017-05-31 NOTE — Progress Notes (Signed)
Patient ID: Bill MiyamotoJames William Welden, male   DOB: [birth year], 51 y.o.   MRN: [id number] I have looked at the patient's left thigh wound and left lower leg.  He does have serious drainage from his thigh wound and cellulitis distally.  He does report improvement already on IV antibiotics.  There is no gross purulence.  No surgery needed today and I will informed Dr. Lajoyce Cornersuda of this patient's admission so Dr. Lajoyce Cornersuda can evaluate him tomorrow.

## 2017-06-01 DIAGNOSIS — E785 Hyperlipidemia, unspecified: Secondary | ICD-10-CM

## 2017-06-01 LAB — UREA NITROGEN, URINE: UREA NITROGEN UR: 928 mg/dL

## 2017-06-01 LAB — BASIC METABOLIC PANEL
Anion gap: 7 (ref 5–15)
BUN: 14 mg/dL (ref 6–20)
CHLORIDE: 109 mmol/L (ref 101–111)
CO2: 24 mmol/L (ref 22–32)
CREATININE: 1.22 mg/dL (ref 0.61–1.24)
Calcium: 8 mg/dL — ABNORMAL LOW (ref 8.9–10.3)
GFR calc Af Amer: >60 mL/min (ref >60)
GFR calc non Af Amer: >60 mL/min (ref >60)
GLUCOSE: 105 mg/dL — AB (ref 65–99)
Potassium: 3.7 mmol/L (ref 3.5–5.1)
SODIUM: 140 mmol/L (ref 135–145)

## 2017-06-01 LAB — CBC
HEMATOCRIT: 26.7 % — AB (ref 39.0–52.0)
Hemoglobin: 8.3 g/dL — ABNORMAL LOW (ref 13.0–17.0)
MCH: 28.9 pg (ref 26.0–34.0)
MCHC: 31.1 g/dL (ref 30.0–36.0)
MCV: 93 fL (ref 78.0–100.0)
PLATELETS: 161 10*3/uL (ref 150–400)
RBC: 2.87 MIL/uL — ABNORMAL LOW (ref 4.22–5.81)
RDW: 15.1 % (ref 11.5–15.5)
WBC: 4.3 10*3/uL (ref 4.0–10.5)

## 2017-06-01 MED ORDER — SENNOSIDES-DOCUSATE SODIUM 8.6-50 MG PO TABS
1.0000 | ORAL_TABLET | Freq: Two times a day (BID) | ORAL | Status: DC
  Filled 2017-06-01 (×5): qty 1

## 2017-06-01 NOTE — Consult Note (Signed)
WOC consult requested prior to ortho service involvement.  Dr Lajoyce Cornersuda has assessed patient and provided plan of care; please refer to him for further questions. Please re-consult if further assistance is needed.  Thank-you,  Cammie Mcgeeawn Dean Wonder MSN, RN, CWOCN, [place]CN-AP, CNS [phone]

## 2017-06-01 NOTE — Care Management (Signed)
Case manager has requested Hospital bed for patient through [facility]. Should patient need Home Health services at discharge,[facility] will need to be contacted,  [facility] in [place], [phone], fax# [phone]. CM will continue to follow.

## 2017-06-01 NOTE — Progress Notes (Signed)
PROGRESS NOTE  Bill MiyamotoJames William [name] ZOX:[id number] DOB: 1966/06/12 DOA: 05/30/2017 PCP: System, Pcp Not In  HPI/Recap of past 24 hours: Pt seen and examined at his bedside. Denies any pain. Has been ambulating. No nausea.   Assessment/Plan: Principal Problem:   Cellulitis of left leg Active Problems:   Open knee wound   Penetrating thigh wound   Necrotizing fasciitis of pelvic region and thigh (HCC)   Hypertension   Aortic dissection (HCC)   HLD (hyperlipidemia)   Back pain   AKI (acute kidney injury) (HCC)   Code Status: Full  Family Communication: Wiife not present at bedside today  Disposition Plan: Will stay another midnight to continue Iv antibiotics.   Consultants:  Gen surgery  Procedures:  none  Antimicrobials:  IV    DVT prophylaxis:  Heparin sq 5000 U TID   Objective: Vitals:   05/31/17 1300 05/31/17 2100 05/31/17 2135 06/01/17 0500  BP: (!) 101/46 123/70 123/70 (!) 107/41  Pulse: 71 69  (!) 59  Resp: 18 18  18   Temp: 98.2 F (36.8 C) 98.4 F (36.9 C)  97.8 F (36.6 C)  TempSrc: Oral Oral  Oral  SpO2: 98% 98%  98%  Weight:      Height:        Intake/Output Summary (Last 24 hours) at 06/01/2017 0750 Last data filed at 05/31/2017 1900 Gross per 24 hour  Intake 2570 ml  Output 1000 ml  Net 1570 ml   Filed Weights   05/30/17 2200 05/30/17 2211  Weight: (!) 188.2 kg (415 lb) (!) 205 kg (451 lb 15.1 oz)    Exam:  General:Not in acute distress HEENT:Eyes: PERRL, EOMI, no scleral icterus.ENT: No discharge from the ears and nose, no injection, no tonsillar enlargement. Neck:No JVD, no bruit, no mass felt.No neck lymph node enlargement. Cardiac:S1/S2, RRR, No murmurs, No gallops or rubs. Respiratory:Good air movement bilaterally. No rales, wheezing, rhonchi or rubs. WJ:XBJYGI:Soft, nondistended, nontender, no rebound pain, no organomegaly, BS present. GU: No hematuria Ext:2+pulse bilaterally. Left thigh is wrapped in surgical  dressing Musculoskeletal:No joint deformities. Strength is intact Skin: No rashes.  Neuro: Alert, oriented X3, cranial nerves II-XII grossly intact, moves all extremities normally.    Data Reviewed: CBC: Recent Labs  Lab 05/31/17 0827 06/01/17 0512  WBC 5.1 4.3  HGB 8.6* 8.3*  HCT 27.5* 26.7*  MCV 91.7 93.0  PLT 179 161   Basic Metabolic Panel: Recent Labs  Lab 05/30/17 2151 05/31/17 0459 06/01/17 0512  NA 130* 138 140  K 3.4* 3.7 3.7  CL 98* 106 109  CO2 24 25 24   GLUCOSE 149* 127* 105*  BUN 20 18 14   CREATININE 1.41* 1.15 1.22  CALCIUM 8.2* 8.1* 8.0*   GFR: Estimated Creatinine Clearance: 131.6 mL/min (by C-G formula based on SCr of 1.22 mg/dL). Liver Function Tests: Recent Labs  Lab 05/30/17 2151  AST 21  ALT 24  ALKPHOS 84  BILITOT 0.9  PROT 6.0*  ALBUMIN 3.1*   No results for input(s): LIPASE, AMYLASE in the last 168 hours. No results for input(s): AMMONIA in the last 168 hours. Coagulation Profile: Recent Labs  Lab 05/30/17 2151  INR 1.07   Cardiac Enzymes: No results for input(s): CKTOTAL, CKMB, CKMBINDEX, TROPONINI in the last 168 hours. BNP (last 3 results) No results for input(s): PROBNP in the last 8760 hours. HbA1C: No results for input(s): HGBA1C in the last 72 hours. CBG: Recent Labs  Lab 05/31/17 0655  GLUCAP 113*   Lipid Profile:  No results for input(s): CHOL, HDL, LDLCALC, TRIG, CHOLHDL, LDLDIRECT in the last 72 hours. Thyroid Function Tests: No results for input(s): TSH, T4TOTAL, FREET4, T3FREE, THYROIDAB in the last 72 hours. Anemia Panel: No results for input(s): VITAMINB12, FOLATE, FERRITIN, TIBC, IRON, RETICCTPCT in the last 72 hours. Urine analysis: No results found for: COLORURINE, APPEARANCEUR, LABSPEC, PHURINE, GLUCOSEU, HGBUR, BILIRUBINUR, KETONESUR, PROTEINUR, UROBILINOGEN, NITRITE, LEUKOCYTESUR Sepsis Labs: @LABRCNTIP (procalcitonin:4,lacticidven:4)  ) Recent Results (from the past 240 hour(s))  Culture, blood  (Routine X 2) w Reflex to ID Panel     Status: None (Preliminary result)   Collection Time: 05/30/17 10:15 PM  Result Value Ref Range Status   Specimen Description BLOOD LEFT ARM  Final   Special Requests IN PEDIATRIC BOTTLE Blood Culture adequate volume  Final   Culture NO GROWTH < 24 HOURS  Final   Report Status PENDING  Incomplete  Culture, blood (Routine X 2) w Reflex to ID Panel     Status: None (Preliminary result)   Collection Time: 05/30/17 10:15 PM  Result Value Ref Range Status   Specimen Description BLOOD LEFT FOREARM  Final   Special Requests IN PEDIATRIC BOTTLE Blood Culture adequate volume  Final   Culture NO GROWTH < 24 HOURS  Final   Report Status PENDING  Incomplete      Studies: No results found.  Scheduled Meds: . aspirin EC  325 mg Oral Daily  . bisoprolol  10 mg Oral Daily  . cloNIDine  0.2 mg Oral BID  . heparin  5,000 Units Subcutaneous Q8H  . multivitamin with minerals  1 tablet Oral Daily  . pravastatin  20 mg Oral q1800  . predniSONE  10 mg Oral Daily  . senna-docusate  1 tablet Oral BID  . topiramate  50 mg Oral BID    Continuous Infusions: . sodium chloride 125 mL/hr at 05/31/17 0400  . piperacillin-tazobactam (ZOSYN)  IV 3.375 g (06/01/17 0625)  . vancomycin Stopped (05/31/17 2357)     LOS: 2 days   Assessment and plan:  Acute on chronic cellulitis of left leg: -Afebrile. No leukocytosis. Clinically nonseptic. No DVT by venous doppler. -IV vancomycin and Zosyn empirically day 2 - PRN Zofran for nausea,dilaudidand Percocet for pain - Blood cultures x 2, results pending - wound care and ortho surgery following  Previousnecrotizing fasciitis of pelvic regionandthigh,Penetrating thigh woundand open knee wound:  -S/p of prior threeirrigation debridements. -Improving -Ortho following; we appreciate recs; 3 days IV antibiotics then oral for discharge planning.  AKI: - cr 1.15 to 1.22 - avoid nephrotoxic meds - IV fluid  hydration - BMP am  Hypertension: -will holdLasix, Cozaar, HCTZdue torecent acute renal injury, also because of high risk of developing sepsis and hypotension -Continue clonidine andZebeta -IV hydralazine when necessary  Hx of aortic dissection (HCC):  -nosymptoms. 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

## 2017-06-02 LAB — CBC
HCT: 26.5 % — ABNORMAL LOW (ref 39.0–52.0)
Hemoglobin: 8.2 g/dL — ABNORMAL LOW (ref 13.0–17.0)
MCH: 29 pg (ref 26.0–34.0)
MCHC: 30.9 g/dL (ref 30.0–36.0)
MCV: 93.6 fL (ref 78.0–100.0)
PLATELETS: 162 10*3/uL (ref 150–400)
RBC: 2.83 MIL/uL — ABNORMAL LOW (ref 4.22–5.81)
RDW: 15.5 % (ref 11.5–15.5)
WBC: 4.7 10*3/uL (ref 4.0–10.5)

## 2017-06-02 LAB — BASIC METABOLIC PANEL
ANION GAP: 5 (ref 5–15)
BUN: 10 mg/dL (ref 6–20)
CO2: 24 mmol/L (ref 22–32)
Calcium: 8.2 mg/dL — ABNORMAL LOW (ref 8.9–10.3)
Chloride: 111 mmol/L (ref 101–111)
Creatinine, Ser: 1.28 mg/dL — ABNORMAL HIGH (ref 0.61–1.24)
GFR calc non Af Amer: >60 mL/min (ref >60)
Glucose, Bld: 96 mg/dL (ref 65–99)
POTASSIUM: 3.7 mmol/L (ref 3.5–5.1)
SODIUM: 140 mmol/L (ref 135–145)

## 2017-06-02 LAB — GLUCOSE, CAPILLARY: Glucose-Capillary: 94 mg/dL (ref 65–99)

## 2017-06-02 MED ORDER — RISAQUAD PO CAPS
1.0000 | ORAL_CAPSULE | Freq: Every day | ORAL | Status: DC
  Administered 2017-06-02 – 2017-06-03 (×2): 1 via ORAL
  Filled 2017-06-02 (×2): qty 1
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
## 2017-06-02 NOTE — Care Management (Signed)
Case manager spoke with patient and his wife to confirm arrangement of hospital bed. The patient and wife requested bed to be delivered on Thursday, 06/11/17, they have spoken with [facility]

## 2017-06-02 NOTE — Progress Notes (Signed)
PROGRESS NOTE  Bill MiyamotoJames William [name] ZOX:[id number] DOB: 1966/04/18 DOA: 05/30/2017 PCP: System, Pcp Not In  HPI/Recap of past 24 hours: No acute events overnight reported. Pt seen and examined. Reports dressing has been changed twice. Has been ambulating in the hallway with PT and tolerating well.  Assessment/Plan: Principal Problem:   Cellulitis of left leg Active Problems:   Open knee wound   Penetrating thigh wound   Necrotizing fasciitis of pelvic region and thigh (HCC)   Hypertension   Aortic dissection (HCC)   HLD (hyperlipidemia)   Back pain   AKI (acute kidney injury) (HCC)  Code Status:Full  Family Communication:Wiife not present at bedside today  Disposition Plan:Will stay another midnight to continue Iv antibiotics.   Consultants:  Gen surgery  Procedures:  none  Antimicrobials:  IV   DVT prophylaxis: Heparin sq 5000 U TID   Objective: Vitals:   06/01/17 0500 06/01/17 1439 06/01/17 2008 06/02/17 0454  BP: (!) 107/41 (!) 134/56 (!) 115/40 (!) 113/54  Pulse: (!) 59 65 (!) 54 (!) 53  Resp: 18 18 17 16   Temp: 97.8 F (36.6 C) 98.1 F (36.7 C) 98.3 F (36.8 C) 97.7 F (36.5 C)  TempSrc: Oral Oral Oral Oral  SpO2: 98% 100% 100% 100%  Weight:      Height:        Intake/Output Summary (Last 24 hours) at 06/02/2017 0803 Last data filed at 06/01/2017 1700 Gross per 24 hour  Intake 810 ml  Output 970 ml  Net -160 ml   Filed Weights   05/30/17 2200 05/30/17 2211  Weight: (!) 188.2 kg (415 lb) (!) 205 kg (451 lb 15.1 oz)    Exam:  General:Not in acute distress HEENT:Eyes: PERRL, EOMI, no scleral icterus.ENT: No discharge from the ears and nose, no injection, notonsillar enlargement. Neck:No JVD, no bruit, no mass felt.No neck lymph node enlargement. Cardiac:S1/S2, RRR, No murmurs, No gallops or rubs. Respiratory:Good air movement bilaterally. No rales, wheezing, rhonchi or rubs. WJ:XBJYGI:Soft, nondistended, nontender, no  rebound pain, no organomegaly, BS present. GU: No hematuria Ext:2+pulse bilaterally. Left thigh/legis wrapped in surgical dressing Musculoskeletal:No joint deformities. Strength is intact Skin: No rashes.  Neuro: Alert, oriented X3, cranial nerves II-XII grossly intact, moves all extremities normally.   Data Reviewed: CBC: Recent Labs  Lab 05/31/17 0827 06/01/17 0512  WBC 5.1 4.3  HGB 8.6* 8.3*  HCT 27.5* 26.7*  MCV 91.7 93.0  PLT 179 161   Basic Metabolic Panel: Recent Labs  Lab 05/30/17 2151 05/31/17 0459 06/01/17 0512  NA 130* 138 140  K 3.4* 3.7 3.7  CL 98* 106 109  CO2 24 25 24   GLUCOSE 149* 127* 105*  BUN 20 18 14   CREATININE 1.41* 1.15 1.22  CALCIUM 8.2* 8.1* 8.0*   GFR: Estimated Creatinine Clearance: 131.6 mL/min (by C-G formula based on SCr of 1.22 mg/dL). Liver Function Tests: Recent Labs  Lab 05/30/17 2151  AST 21  ALT 24  ALKPHOS 84  BILITOT 0.9  PROT 6.0*  ALBUMIN 3.1*   No results for input(s): LIPASE, AMYLASE in the last 168 hours. No results for input(s): AMMONIA in the last 168 hours. Coagulation Profile: Recent Labs  Lab 05/30/17 2151  INR 1.07   Cardiac Enzymes: No results for input(s): CKTOTAL, CKMB, CKMBINDEX, TROPONINI in the last 168 hours. BNP (last 3 results) No results for input(s): PROBNP in the last 8760 hours. HbA1C: No results for input(s): HGBA1C in the last 72 hours. CBG: Recent Labs  Lab 05/31/17  [id number] 06/02/17 0613  GLUCAP 113* 94   Lipid Profile: No results for input(s): CHOL, HDL, LDLCALC, TRIG, CHOLHDL, LDLDIRECT in the last 72 hours. Thyroid Function Tests: No results for input(s): TSH, T4TOTAL, FREET4, T3FREE, THYROIDAB in the last 72 hours. Anemia Panel: No results for input(s): VITAMINB12, FOLATE, FERRITIN, TIBC, IRON, RETICCTPCT in the last 72 hours. Urine analysis: No results found for: COLORURINE, APPEARANCEUR, LABSPEC, PHURINE, GLUCOSEU, HGBUR, BILIRUBINUR, KETONESUR, PROTEINUR, UROBILINOGEN,  NITRITE, LEUKOCYTESUR Sepsis Labs: @LABRCNTIP (procalcitonin:4,lacticidven:4)  ) Recent Results (from the past 240 hour(s))  Culture, blood (Routine X 2) w Reflex to ID Panel     Status: None (Preliminary result)   Collection Time: 05/30/17 10:15 PM  Result Value Ref Range Status   Specimen Description BLOOD LEFT ARM  Final   Special Requests IN PEDIATRIC BOTTLE Blood Culture adequate volume  Final   Culture NO GROWTH 2 DAYS  Final   Report Status PENDING  Incomplete  Culture, blood (Routine X 2) w Reflex to ID Panel     Status: None (Preliminary result)   Collection Time: 05/30/17 10:15 PM  Result Value Ref Range Status   Specimen Description BLOOD LEFT FOREARM  Final   Special Requests IN PEDIATRIC BOTTLE Blood Culture adequate volume  Final   Culture NO GROWTH 2 DAYS  Final   Report Status PENDING  Incomplete      Studies: No results found.  Scheduled Meds: . aspirin EC  325 mg Oral Daily  . bisoprolol  10 mg Oral Daily  . cloNIDine  0.2 mg Oral BID  . heparin  5,000 Units Subcutaneous Q8H  . multivitamin with minerals  1 tablet Oral Daily  . pravastatin  20 mg Oral q1800  . predniSONE  10 mg Oral Daily  . senna-docusate  1 tablet Oral BID  . topiramate  50 mg Oral BID    Continuous Infusions: . sodium chloride 125 mL/hr at 06/01/17 2131  . piperacillin-tazobactam (ZOSYN)  IV Stopped (06/02/17 0931)  . vancomycin Stopped (06/01/17 2314)     LOS: 3 days   Assessment and plan:  Acute on chronic cellulitis of left leg: -Afebrile.No leukocytosis. Clinically nonseptic. No DVT by venous doppler. -IVvancomycin and Zosynempirically day 3 - PRN Zofran for nausea,dilaudidand Percocet for pain - Blood cultures x 2, results in process - wound care and ortho surgery following  Previousnecrotizing fasciitis of pelvic regionandthigh,Penetrating thigh woundand open knee wound: -S/p of prior threeirrigation debridements. -Improving -Ortho following; we  appreciate recs; 3 days IV antibiotics then oral for discharge planning.  AKI: - cr 1.15 to 1.22 to 1.28 - avoid nephrotoxic meds -increase IV fluid hydration - BMP am  Hypertension: -will holdLasix, Cozaar, HCTZdue torecentacute renal injury, also because of high risk of developing sepsis and hypotension -Continue clonidine andZebeta -IV hydralazine when necessary  Hx of aortic dissection (HCC): -nosymptoms. 

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
## 2017-06-02 NOTE — Plan of Care (Signed)
  Progressing Safety: Ability to remain free from injury will improve 06/02/2017 1112 - Progressing by Forbes Cellarraddock, Tabia Landowski W, RN Health Behavior/Discharge Planning: Ability to manage health-related needs will improve 06/02/2017 1112 - Progressing by Forbes Cellarraddock, Kirt Chew W, RN Pain Managment: General experience of comfort will improve 06/02/2017 1112 - Progressing by Forbes Cellarraddock, Jac Romulus W, RN Physical Regulation: Ability to maintain clinical measurements within normal limits will improve 06/02/2017 1112 - Progressing by Forbes Cellarraddock, Keita Demarco W, RN Will remain free from infection 06/02/2017 1112 - Progressing by Forbes Cellarraddock, Briggett Tuccillo W, RN Skin Integrity: Risk for impaired skin integrity will decrease 06/02/2017 1112 - Progressing by Forbes Cellarraddock, Jene Oravec W, RN Tissue Perfusion: Risk factors for ineffective tissue perfusion will decrease 06/02/2017 1112 - Progressing by Forbes Cellarraddock, Jamarea Selner W, RN Activity: Risk for activity intolerance will decrease 06/02/2017 1112 - Progressing by Forbes Cellarraddock, Zykeriah Mathia W, RN Fluid Volume: Ability to maintain a balanced intake and output will improve 06/02/2017 1112 - Progressing by Forbes Cellarraddock, Zamiya Dillard W, RN Nutrition: Adequate nutrition will be maintained 06/02/2017 1112 - Progressing by Forbes Cellarraddock, Jeremy Mclamb W, RN Bowel/Gastric: Will not experience complications related to bowel motility 06/02/2017 1112 - Progressing by Forbes Cellarraddock, Scout Guyett W, RN

## 2017-06-03 ENCOUNTER — Encounter (HOSPITAL_COMMUNITY): Payer: Self-pay

## 2017-06-03 ENCOUNTER — Other Ambulatory Visit: Payer: Self-pay

## 2017-06-03 ENCOUNTER — Telehealth (INDEPENDENT_AMBULATORY_CARE_PROVIDER_SITE_OTHER): Payer: Self-pay | Admitting: Orthopedic Surgery

## 2017-06-03 LAB — BASIC METABOLIC PANEL
Anion gap: 6 (ref 5–15)
BUN: 8 mg/dL (ref 6–20)
CALCIUM: 8.5 mg/dL — AB (ref 8.9–10.3)
CO2: 23 mmol/L (ref 22–32)
CREATININE: 1.33 mg/dL — AB (ref 0.61–1.24)
Chloride: 112 mmol/L — ABNORMAL HIGH (ref 101–111)
GFR calc Af Amer: >60 mL/min (ref >60)
GLUCOSE: 98 mg/dL (ref 65–99)
POTASSIUM: 3.5 mmol/L (ref 3.5–5.1)
SODIUM: 141 mmol/L (ref 135–145)

## 2017-06-03 LAB — GLUCOSE, CAPILLARY: GLUCOSE-CAPILLARY: 102 mg/dL — AB (ref 65–99)

## 2017-06-03 LAB — MRSA PCR SCREENING: MRSA by PCR: NEGATIVE

## 2017-06-03 MED ORDER — DOXYCYCLINE HYCLATE 50 MG PO CAPS: 100.0000 mg | ORAL_CAPSULE | Freq: Two times a day (BID) | ORAL | 0 refills | Status: AC

## 2017-06-03 MED ORDER — AMOXICILLIN-POT CLAVULANATE 875-125 MG PO TABS
1.0000 | ORAL_TABLET | Freq: Two times a day (BID) | ORAL | Status: DC
  Administered 2017-06-03: 1 via ORAL
  Filled 2017-06-03: qty 1

## 2017-06-03 MED ORDER — DOXYCYCLINE HYCLATE 100 MG PO TABS
100.0000 mg | ORAL_TABLET | Freq: Two times a day (BID) | ORAL | Status: DC
  Administered 2017-06-03: 100 mg via ORAL
  Filled 2017-06-03: qty 1

## 2017-06-03 MED ORDER — AMOXICILLIN-POT CLAVULANATE 875-125 MG PO TABS: 1.0000 | ORAL_TABLET | Freq: Two times a day (BID) | ORAL | 0 refills | Status: DC

## 2017-06-03 NOTE — Telephone Encounter (Signed)
Will advise after patients appointment tomorrow.

## 2017-06-03 NOTE — Progress Notes (Signed)
Bill MiyamotoJames William [name] to be D/C'd Home per MD order.  Discussed prescriptions and follow up appointments with the patient. Prescriptions given to patient, medication list explained in detail. Pt verbalized understanding.  Allergies as of 06/03/2017   No Known Allergies     Medication List    STOP taking these medications   doxycycline 100 MG tablet Commonly known as:  VIBRA-TABS Replaced by:  doxycycline 50 MG capsule     TAKE these medications   acetaminophen 650 MG CR tablet Commonly known as:  TYLENOL Take 650 mg as needed by mouth.   amoxicillin-clavulanate 875-125 MG tablet Commonly known as:  AUGMENTIN Take 1 tablet every 12 (twelve) hours by mouth.   aspirin EC 325 MG tablet Take 1 tablet (325 mg total) by mouth daily. What changed:  how much to take   bisoprolol 10 MG tablet Commonly known as:  ZEBETA Take 10 mg by mouth daily.   diclofenac 75 MG EC tablet Commonly known as:  VOLTAREN Take 75 mg by mouth 2 (two) times daily with a meal.   doxycycline 50 MG capsule Commonly known as:  VIBRAMYCIN Take 2 capsules (100 mg total) 2 (two) times daily for 7 days by mouth. Replaces:  doxycycline 100 MG tablet   furosemide 20 MG tablet Commonly known as:  LASIX Take 20 mg by mouth See admin instructions. Mon, Wed, Thur, and Sat   hydrochlorothiazide 25 MG tablet Commonly known as:  HYDRODIURIL Take 25 mg by mouth daily.   losartan 100 MG tablet Commonly known as:  COZAAR Take 100 mg by mouth daily.   oxyCODONE-acetaminophen 5-325 MG tablet Commonly known as:  PERCOCET/ROXICET Take 1 tablet by mouth every 4 (four) hours as needed for severe pain.   pravastatin 20 MG tablet Commonly known as:  PRAVACHOL Take 20 mg by mouth daily.   predniSONE 10 MG tablet Commonly known as:  DELTASONE Take 10 mg by mouth daily.   topiramate 50 MG tablet Commonly known as:  TOPAMAX Take 50 mg by mouth 2 (two) times daily.            Durable Medical Equipment   (From admission, onward)        Start     Ordered   06/01/17 1104  For home use only DME Hospital bed  Once    Comments:  Patient needs bariatric bed. Weight is 451 lbs.  Bed needed to keep patient's legs elevated  Question Answer Comment  Patient has (list medical condition): left leg cellulitis   The above medical condition requires: Patient requires the ability to reposition frequently   Head must be elevated greater than: 30 degrees   Bed type Semi-electric      06/01/17 1105      Vitals:   06/02/17 2024 06/03/17 0700  BP: 137/71 (!) 109/57  Pulse: 64 (!) 57  Resp: 18 16  Temp: 98.5 F (36.9 C) 98.2 F (36.8 C)  SpO2: 100% 100%    Skin clean, dry and intact without evidence of skin break down, left upper and lower leg suturing. IV catheter discontinued intact. Site without signs and symptoms of complications. Dressing and pressure applied. Pt denies pain at this time. No complaints noted.  An After Visit Summary was printed and given to the patient. Patient escorted via WC, and D/C home via private auto.  GrenadaBrittany Rayon Mcchristian RN
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
## 2017-06-03 NOTE — Care Management Note (Addendum)
Case Management Note  Patient Details  Name: Bill MiyamotoJames William Vannest MRN: [id number] Date of Birth: 16-May-1966  Subjective/Objective:                 Spoke w patient and wife at the bedside. They decline needs for DME including Hospital Bed that patient's wife has now canceled it. They would like  [facility]. [facility]. [facility] with Ash[facility] at [facility]. Information faxed to [phone], case ID number is [phone] No other CM needs identified. CM signing off.    Action/Plan:   Expected Discharge Date:  06/03/17               Expected Discharge Plan:  Home w Home Health Services  In-House Referral:     Discharge planning Services  CM Consult  Post Acute Care Choice:    Choice offered to:  Patient, Spouse  DME Arranged:    DME Agency:     HH Arranged:  PT [facility]  Status of Service:  Completed, signed off  If discussed at Long Length of Stay Meetings, dates discussed:    Additional Comments:  Lawerance SabalDebbie Leno Mathes, RN 06/03/2017, 10:43 AM

## 2017-06-03 NOTE — Telephone Encounter (Signed)
Case manager called to check on patient, advised us to call her if we feel the need for continued case management services.  Bill MusterAlicia [phone] ext [phone]

## 2017-06-04 ENCOUNTER — Ambulatory Visit (INDEPENDENT_AMBULATORY_CARE_PROVIDER_SITE_OTHER): Payer: Managed Care, Other (non HMO) | Admitting: Orthopedic Surgery

## 2017-06-04 ENCOUNTER — Encounter (INDEPENDENT_AMBULATORY_CARE_PROVIDER_SITE_OTHER): Payer: Self-pay | Admitting: Orthopedic Surgery

## 2017-06-04 VITALS — Ht 73.0 in | Wt >= 6400 oz

## 2017-06-04 DIAGNOSIS — S71132D Puncture wound without foreign body, left thigh, subsequent encounter: Secondary | ICD-10-CM

## 2017-06-04 LAB — CULTURE, BLOOD (ROUTINE X 2)
CULTURE: NO GROWTH
Culture: NO GROWTH
SPECIAL REQUESTS: ADEQUATE
Special Requests: ADEQUATE
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
## 2017-06-05 NOTE — Telephone Encounter (Signed)
I did leave voicemail for Bill [name] to advise if they could just go out for weekly wound checks, patients wife is back at home and is able to assist with dry dressing.

## 2017-06-10 ENCOUNTER — Other Ambulatory Visit (INDEPENDENT_AMBULATORY_CARE_PROVIDER_SITE_OTHER): Payer: Self-pay | Admitting: Orthopedic Surgery

## 2017-06-11 ENCOUNTER — Ambulatory Visit (INDEPENDENT_AMBULATORY_CARE_PROVIDER_SITE_OTHER): Payer: Managed Care, Other (non HMO) | Admitting: Orthopedic Surgery

## 2017-06-11 ENCOUNTER — Encounter (INDEPENDENT_AMBULATORY_CARE_PROVIDER_SITE_OTHER): Payer: Self-pay | Admitting: Orthopedic Surgery

## 2017-06-11 VITALS — Ht 73.0 in | Wt >= 6400 oz

## 2017-06-11 DIAGNOSIS — M726 Necrotizing fasciitis: Secondary | ICD-10-CM

## 2017-06-11 DIAGNOSIS — S71132A Puncture wound without foreign body, left thigh, initial encounter: Secondary | ICD-10-CM

## 2017-06-11 MED ORDER — OXYCODONE-ACETAMINOPHEN 5-325 MG PO TABS: 1.0000 | ORAL_TABLET | Freq: Four times a day (QID) | ORAL | 0 refills | Status: DC | PRN
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
## 2017-06-22 ENCOUNTER — Ambulatory Visit (INDEPENDENT_AMBULATORY_CARE_PROVIDER_SITE_OTHER): Payer: Managed Care, Other (non HMO) | Admitting: Orthopedic Surgery

## 2017-06-22 ENCOUNTER — Encounter (INDEPENDENT_AMBULATORY_CARE_PROVIDER_SITE_OTHER): Payer: Self-pay | Admitting: Orthopedic Surgery

## 2017-06-22 VITALS — Ht 73.0 in | Wt >= 6400 oz

## 2017-06-22 DIAGNOSIS — S81002D Unspecified open wound, left knee, subsequent encounter: Secondary | ICD-10-CM

## 2017-06-22 DIAGNOSIS — S81832D Puncture wound without foreign body, left lower leg, subsequent encounter: Secondary | ICD-10-CM
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
## 2017-09-17 ENCOUNTER — Ambulatory Visit (INDEPENDENT_AMBULATORY_CARE_PROVIDER_SITE_OTHER): Payer: Self-pay

## 2017-09-17 ENCOUNTER — Ambulatory Visit (INDEPENDENT_AMBULATORY_CARE_PROVIDER_SITE_OTHER): Payer: Managed Care, Other (non HMO)

## 2017-09-17 ENCOUNTER — Ambulatory Visit (INDEPENDENT_AMBULATORY_CARE_PROVIDER_SITE_OTHER): Payer: Managed Care, Other (non HMO) | Admitting: Orthopedic Surgery

## 2017-09-17 ENCOUNTER — Encounter (INDEPENDENT_AMBULATORY_CARE_PROVIDER_SITE_OTHER): Payer: Self-pay | Admitting: Orthopedic Surgery

## 2017-09-17 DIAGNOSIS — G8929 Other chronic pain: Secondary | ICD-10-CM | POA: Diagnosis not present

## 2017-09-17 DIAGNOSIS — M25562 Pain in left knee: Secondary | ICD-10-CM

## 2017-09-17 DIAGNOSIS — M25561 Pain in right knee: Secondary | ICD-10-CM

## 2017-09-17 DIAGNOSIS — S71132D Puncture wound without foreign body, left thigh, subsequent encounter: Secondary | ICD-10-CM | POA: Diagnosis not present
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
## 2017-12-14 ENCOUNTER — Ambulatory Visit (INDEPENDENT_AMBULATORY_CARE_PROVIDER_SITE_OTHER): Payer: Managed Care, Other (non HMO) | Admitting: Orthopedic Surgery

## 2017-12-14 ENCOUNTER — Encounter (INDEPENDENT_AMBULATORY_CARE_PROVIDER_SITE_OTHER): Payer: Self-pay | Admitting: Orthopedic Surgery

## 2017-12-14 DIAGNOSIS — G8929 Other chronic pain: Secondary | ICD-10-CM | POA: Diagnosis not present

## 2017-12-14 DIAGNOSIS — M25562 Pain in left knee: Secondary | ICD-10-CM

## 2017-12-14 DIAGNOSIS — M25561 Pain in right knee: Secondary | ICD-10-CM | POA: Diagnosis not present
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
## 2017-12-16 ENCOUNTER — Telehealth (INDEPENDENT_AMBULATORY_CARE_PROVIDER_SITE_OTHER): Payer: Self-pay

## 2017-12-16 NOTE — Telephone Encounter (Signed)
Submitted application online for Monovisc injection, bilateral knee. 

## 2017-12-30 ENCOUNTER — Telehealth (INDEPENDENT_AMBULATORY_CARE_PROVIDER_SITE_OTHER): Payer: Self-pay

## 2017-12-30 NOTE — Telephone Encounter (Signed)
PA form being faxed per [facility].

## 2018-01-25 ENCOUNTER — Telehealth (INDEPENDENT_AMBULATORY_CARE_PROVIDER_SITE_OTHER): Payer: Self-pay

## 2018-01-25 NOTE — Telephone Encounter (Signed)
PA submitted for Monovisc, bilateral knee, per Toniann FailWendy May on 01/22/18.

## 2018-01-26 ENCOUNTER — Telehealth (INDEPENDENT_AMBULATORY_CARE_PROVIDER_SITE_OTHER): Payer: Self-pay

## 2018-01-26 NOTE — Telephone Encounter (Signed)
Talked with patient and advised him that he was approved to have Monovisc injection, bilateral knee. Covered at 80% and patient will be responsible for 20% OOP. [place] Approved Authorization# [id number] Valid 01/22/2018- 02/19/2018  Appt.scheduled 02/16/2018

## 2018-02-16 ENCOUNTER — Ambulatory Visit (INDEPENDENT_AMBULATORY_CARE_PROVIDER_SITE_OTHER): Payer: Managed Care, Other (non HMO) | Admitting: Orthopedic Surgery

## 2018-02-16 ENCOUNTER — Encounter (INDEPENDENT_AMBULATORY_CARE_PROVIDER_SITE_OTHER): Payer: Self-pay | Admitting: Orthopedic Surgery

## 2018-02-16 DIAGNOSIS — M170 Bilateral primary osteoarthritis of knee: Secondary | ICD-10-CM

## 2018-02-16 DIAGNOSIS — M1712 Unilateral primary osteoarthritis, left knee: Secondary | ICD-10-CM | POA: Diagnosis not present

## 2018-02-16 DIAGNOSIS — Z6841 Body Mass Index (BMI) 40.0 and over, adult: Secondary | ICD-10-CM | POA: Diagnosis not present

## 2018-02-16 DIAGNOSIS — M1711 Unilateral primary osteoarthritis, right knee: Secondary | ICD-10-CM | POA: Diagnosis not present

## 2018-02-16 MED ORDER — HYALURONAN 88 MG/4ML IX SOSY
88.0000 mg | PREFILLED_SYRINGE | INTRA_ARTICULAR | Status: AC | PRN
  Administered 2018-02-16: 88 mg via INTRA_ARTICULAR
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
## 2018-06-21 ENCOUNTER — Encounter (INDEPENDENT_AMBULATORY_CARE_PROVIDER_SITE_OTHER): Payer: Self-pay | Admitting: Orthopedic Surgery

## 2018-06-21 ENCOUNTER — Ambulatory Visit (INDEPENDENT_AMBULATORY_CARE_PROVIDER_SITE_OTHER): Payer: Managed Care, Other (non HMO) | Admitting: Orthopedic Surgery

## 2018-06-21 DIAGNOSIS — Z6841 Body Mass Index (BMI) 40.0 and over, adult: Secondary | ICD-10-CM

## 2018-06-21 DIAGNOSIS — M170 Bilateral primary osteoarthritis of knee: Secondary | ICD-10-CM | POA: Diagnosis not present

## 2018-06-22 ENCOUNTER — Encounter (INDEPENDENT_AMBULATORY_CARE_PROVIDER_SITE_OTHER): Payer: Self-pay | Admitting: Orthopedic Surgery
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
## 2018-06-29 ENCOUNTER — Ambulatory Visit (INDEPENDENT_AMBULATORY_CARE_PROVIDER_SITE_OTHER): Payer: Self-pay | Admitting: Physician Assistant

## 2018-07-06 NOTE — Pre-Procedure Instructions (Signed)
[name]  07/06/2018      [id number] [name], [place] - [address] AT [address] [name] [address] [name] [address] Phone: [phone] Fax: [phone]  [id number] - [name], [place] - [address] [place] [address] Phone: [phone] Fax: [phone]  [facility] [phone] [name], [place] - [address] [place] [address] Phone: [phone] Fax: [phone]    Your procedure is scheduled on Wednesday December 18th.  Report to [facility] [employer] Admitting at 0800 A.M.  Call this number if you have problems the morning of surgery:  [phone]   Remember:  Do not eat or drink after midnight.    Take these medicines the morning of surgery with A SIP OF WATER  acetaminophen (TYLENOL) if needed pravastatin (PRAVACHOL) topiramate (TOPAMAX)  Follow your surgeon's instructions on when to stop Asprin.  If no instructions were given by your surgeon then you will need to call the office to get those instructions.    7 days prior to surgery STOP taking any diclofenac (VOLTAREN), Aspirin(unless otherwise instructed by your surgeon), Aleve, Naproxen, Ibuprofen, Motrin, Advil, Goody's, BC's, all herbal medications, fish oil, and all vitamins     Do not wear jewelry.  Do not wear lotions, powders, or colognes, or deodorant.  Men may shave face and neck.  Do not bring valuables to the hospital.  [facility] is not responsible for any belongings or valuables.  Contacts, dentures or bridgework may not be worn into surgery.  Leave your suitcase in the car.  After surgery it may be brought to your room.  For patients admitted to the hospital, discharge time will be determined by your treatment team.  Patients discharged the day of surgery will not be allowed to drive home.    [facility]- Preparing For Surgery  Before surgery, you can play an important role. Because skin is  not sterile, your skin needs to be as free of germs as possible. You can reduce the number of germs on your skin by washing with CHG (chlorahexidine gluconate) Soap before surgery.  CHG is an antiseptic cleaner which kills germs and bonds with the skin to continue killing germs even after washing.    Oral Hygiene is also important to reduce your risk of infection.  Remember - BRUSH YOUR TEETH THE MORNING OF SURGERY WITH YOUR REGULAR TOOTHPASTE  Please do not use if you have an allergy to CHG or antibacterial soaps. If your skin becomes reddened/irritated stop using the CHG.  Do not shave (including legs and underarms) for at least 48 hours prior to first CHG shower. It is OK to shave your face.  Please follow these instructions carefully.   1. Shower the NIGHT BEFORE SURGERY and the MORNING OF SURGERY with CHG.   2. If you chose to wash your hair, wash your hair first as usual with your normal shampoo.  3. After you shampoo, rinse your hair and body thoroughly to remove the shampoo.  4. Use CHG as you would any other liquid soap. You can apply CHG directly to the skin and wash gently with a scrungie or a clean washcloth.   5. Apply the CHG Soap to your body ONLY FROM THE NECK DOWN.  Do not use on open wounds or open sores. Avoid contact with your eyes, ears, mouth and genitals (private parts). Wash Face and genitals (private parts)  with your normal soap.  6. Wash thoroughly, paying special attention to the area where your surgery will be performed.  7. Thoroughly rinse your body with warm water from the neck down.  8. DO NOT shower/wash with your normal soap after using and rinsing off the CHG Soap.  9. Pat yourself dry with a CLEAN TOWEL.  10. Wear CLEAN PAJAMAS to bed the night before surgery, wear comfortable clothes the morning of surgery  11. Place CLEAN SHEETS on your bed the night of your first shower and DO NOT SLEEP WITH PETS.    Day of Surgery:  Do not apply any  deodorants/lotions.  Please wear clean clothes to the hospital/surgery center.   Remember to brush your teeth WITH YOUR REGULAR TOOTHPASTE.    Please read over the following fact sheets that you were given.

## 2018-07-07 ENCOUNTER — Encounter (HOSPITAL_COMMUNITY)
Admission: RE | Admit: 2018-07-07 | Discharge: 2018-07-07 | Disposition: A | Discharge status: Home / Self Care | Payer: Managed Care, Other (non HMO) | Source: Ambulatory Visit | Attending: Orthopedic Surgery | Admitting: Orthopedic Surgery

## 2018-07-07 ENCOUNTER — Other Ambulatory Visit: Payer: Self-pay

## 2018-07-07 ENCOUNTER — Encounter (HOSPITAL_COMMUNITY): Payer: Self-pay

## 2018-07-07 DIAGNOSIS — Z01818 Encounter for other preprocedural examination: Secondary | ICD-10-CM | POA: Diagnosis present

## 2018-07-07 DIAGNOSIS — M1712 Unilateral primary osteoarthritis, left knee: Secondary | ICD-10-CM | POA: Insufficient documentation

## 2018-07-07 HISTORY — DX: Sleep apnea, unspecified: G47.30

## 2018-07-07 HISTORY — DX: Cardiac arrhythmia, unspecified: I49.9

## 2018-07-07 HISTORY — DX: Nontoxic single thyroid nodule: E04.1

## 2018-07-07 LAB — CBC
HCT: 46.5 % (ref 39.0–52.0)
Hemoglobin: 14.6 g/dL (ref 13.0–17.0)
MCH: 28 pg (ref 26.0–34.0)
MCHC: 31.4 g/dL (ref 30.0–36.0)
MCV: 89.1 fL (ref 80.0–100.0)
NRBC: 0 % (ref 0.0–0.2)
Platelets: 177 10*3/uL (ref 150–400)
RBC: 5.22 MIL/uL (ref 4.22–5.81)
RDW: 13.2 % (ref 11.5–15.5)
WBC: 5.2 10*3/uL (ref 4.0–10.5)

## 2018-07-07 LAB — COMPREHENSIVE METABOLIC PANEL
ALT: 34 U/L (ref 0–44)
AST: 25 U/L (ref 15–41)
Albumin: 3.7 g/dL (ref 3.5–5.0)
Alkaline Phosphatase: 78 U/L (ref 38–126)
Anion gap: 12 (ref 5–15)
BUN: 18 mg/dL (ref 6–20)
CO2: 23 mmol/L (ref 22–32)
Calcium: 9.1 mg/dL (ref 8.9–10.3)
Chloride: 107 mmol/L (ref 98–111)
Creatinine, Ser: 1.39 mg/dL — ABNORMAL HIGH (ref 0.61–1.24)
GFR, EST NON AFRICAN AMERICAN: 58 mL/min — AB (ref >60)
GLUCOSE: 151 mg/dL — AB (ref 70–99)
Potassium: 3.3 mmol/L — ABNORMAL LOW (ref 3.5–5.1)
Sodium: 142 mmol/L (ref 135–145)
Total Bilirubin: 1 mg/dL (ref 0.3–1.2)
Total Protein: 6.8 g/dL (ref 6.5–8.1)

## 2018-07-07 LAB — SURGICAL PCR SCREEN
MRSA, PCR: NEGATIVE
Staphylococcus aureus: POSITIVE — AB

## 2018-07-07 NOTE — Progress Notes (Signed)
PCP - Mariana Kaufmaniane Elliot NP Cardiologist - Dr. Enrigue CatenaBoshra Zakhary  Chest x-ray - N/A [facility]  Sleep Study - ~2013 CPAP - doesn't use one  Blood Thinner Instructions: N/A Aspirin Instructions:will call Dr. Audrie Liauda's office  Anesthesia review: yes. Requested EKG  Patient denies shortness of breath, fever, cough and chest pain at PAT appointment   Patient verbalized understanding of instructions that were given to them at the PAT appointment. Patient was also instructed that they will need to review over the PAT instructions again at home before surgery.

## 2018-07-13 MED ORDER — TRANEXAMIC ACID 1000 MG/10ML IV SOLN
2000.0000 mg | INTRAVENOUS | Status: AC
  Administered 2018-07-14: 2000 mg via TOPICAL
  Filled 2018-07-13: qty 20
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
## 2018-07-13 NOTE — Anesthesia Preprocedure Evaluation (Addendum)
Anesthesia Evaluation  Patient identified by MRN, date of birth, ID band Patient awake    Reviewed: Allergy & Precautions, NPO status , Patient's Chart, lab work & pertinent test results, reviewed documented beta blocker date and time   History of Anesthesia Complications (+) PONV  Airway Mallampati: III  TM Distance: >3 FB Neck ROM: Full    Dental  (+) Teeth Intact, Dental Advisory Given   Pulmonary sleep apnea , former smoker,    breath sounds clear to auscultation       Cardiovascular hypertension, Pt. on medications and Pt. on home beta blockers + dysrhythmias Atrial Fibrillation  Rhythm:Regular Rate:Normal     Neuro/Psych negative neurological ROS  negative psych ROS   GI/Hepatic negative GI ROS, Neg liver ROS,   Endo/Other  negative endocrine ROS  Renal/GU      Musculoskeletal negative musculoskeletal ROS (+)   Abdominal (+) + obese,   Peds  Hematology negative hematology ROS (+)   Anesthesia Other Findings   Reproductive/Obstetrics                           Lab Results  Component Value Date   WBC 5.2 07/07/2018   HGB 14.6 07/07/2018   HCT 46.5 07/07/2018   MCV 89.1 07/07/2018   PLT 177 07/07/2018   Lab Results  Component Value Date   CREATININE 1.39 (H) 07/07/2018   BUN 18 07/07/2018   NA 142 07/07/2018   K 3.3 (L) 07/07/2018   CL 107 07/07/2018   CO2 23 07/07/2018   Lab Results  Component Value Date   INR 1.07 05/30/2017   INR 1.78 05/15/2017   INR 1.62 05/14/2017   Echo (12/01/2016):  Normal LV systolic function, EF 60-65%, no segmental wall motion abnormalities   Anesthesia Physical Anesthesia Plan  ASA: III  Anesthesia Plan: General   Post-op Pain Management: GA combined w/ Regional for post-op pain   Induction: Intravenous  PONV Risk Score and Plan: 3 and Ondansetron, Dexamethasone and Midazolam  Airway Management Planned: LMA  Additional  Equipment: None  Intra-op Plan:   Post-operative Plan: Extubation in OR  Informed Consent: I have reviewed the patients History and Physical, chart, labs and discussed the procedure including the risks, benefits and alternatives for the proposed anesthesia with the patient or authorized representative who has indicated his/her understanding and acceptance.   Dental advisory given  Plan Discussed with: CRNA  Anesthesia Plan Comments: (See PAT note 07/07/2018 by Antionette PolesJames Burns, PA-C )      Anesthesia Quick Evaluation

## 2018-07-14 ENCOUNTER — Inpatient Hospital Stay (HOSPITAL_COMMUNITY): Payer: Managed Care, Other (non HMO) | Admitting: Physician Assistant

## 2018-07-14 ENCOUNTER — Inpatient Hospital Stay (HOSPITAL_COMMUNITY)
Admission: AD | Admit: 2018-07-14 | Discharge: 2018-07-16 | DRG: 470 | Disposition: A | Discharge status: Home Health | Payer: Managed Care, Other (non HMO) | Attending: Orthopedic Surgery | Admitting: Orthopedic Surgery

## 2018-07-14 ENCOUNTER — Encounter (HOSPITAL_COMMUNITY)
Admission: AD | Disposition: A | Discharge status: Home Health | Payer: Self-pay | Source: Home / Self Care | Attending: Orthopedic Surgery

## 2018-07-14 ENCOUNTER — Other Ambulatory Visit: Payer: Self-pay

## 2018-07-14 ENCOUNTER — Encounter (HOSPITAL_COMMUNITY): Payer: Self-pay | Admitting: Anesthesiology

## 2018-07-14 DIAGNOSIS — G4730 Sleep apnea, unspecified: Secondary | ICD-10-CM | POA: Diagnosis present

## 2018-07-14 DIAGNOSIS — Z87891 Personal history of nicotine dependence: Secondary | ICD-10-CM

## 2018-07-14 DIAGNOSIS — Z96652 Presence of left artificial knee joint: Secondary | ICD-10-CM

## 2018-07-14 DIAGNOSIS — M1712 Unilateral primary osteoarthritis, left knee: Principal | ICD-10-CM

## 2018-07-14 DIAGNOSIS — E785 Hyperlipidemia, unspecified: Secondary | ICD-10-CM | POA: Diagnosis present

## 2018-07-14 DIAGNOSIS — Z6841 Body Mass Index (BMI) 40.0 and over, adult: Secondary | ICD-10-CM

## 2018-07-14 DIAGNOSIS — I10 Essential (primary) hypertension: Secondary | ICD-10-CM | POA: Diagnosis present

## 2018-07-14 HISTORY — DX: Unilateral primary osteoarthritis, left knee: M17.12

## 2018-07-14 HISTORY — DX: Other specified postprocedural states: Z98.890

## 2018-07-14 HISTORY — DX: Adverse effect of unspecified anesthetic, initial encounter: T41.45XA

## 2018-07-14 HISTORY — DX: Other specified postprocedural states: R11.2

## 2018-07-14 HISTORY — PX: TOTAL KNEE ARTHROPLASTY: SHX125

## 2018-07-14 HISTORY — DX: Other complications of anesthesia, initial encounter: T88.59XA

## 2018-07-14 SURGERY — ARTHROPLASTY, KNEE, TOTAL
Anesthesia: General | Site: Knee | Laterality: Left

## 2018-07-14 MED ORDER — CEFAZOLIN SODIUM-DEXTROSE 2-4 GM/100ML-% IV SOLN
2.0000 g | Freq: Four times a day (QID) | INTRAVENOUS | Status: AC
  Administered 2018-07-14 (×2): 2 g via INTRAVENOUS
  Filled 2018-07-14 (×2): qty 100

## 2018-07-14 MED ORDER — MIDAZOLAM HCL 2 MG/2ML IJ SOLN
INTRAMUSCULAR | Status: AC
  Filled 2018-07-14: qty 2

## 2018-07-14 MED ORDER — FENTANYL CITRATE (PF) 250 MCG/5ML IJ SOLN
INTRAMUSCULAR | Status: AC
  Filled 2018-07-14: qty 5

## 2018-07-14 MED ORDER — HYDROMORPHONE HCL 1 MG/ML IJ SOLN
0.2500 mg | INTRAMUSCULAR | Status: DC | PRN
  Administered 2018-07-14 (×4): 0.5 mg via INTRAVENOUS

## 2018-07-14 MED ORDER — METHOCARBAMOL 500 MG PO TABS
500.0000 mg | ORAL_TABLET | Freq: Four times a day (QID) | ORAL | Status: DC | PRN
  Administered 2018-07-14 – 2018-07-16 (×5): 500 mg via ORAL
  Filled 2018-07-14 (×5): qty 1

## 2018-07-14 MED ORDER — TRANEXAMIC ACID-NACL 1000-0.7 MG/100ML-% IV SOLN
1000.0000 mg | INTRAVENOUS | Status: AC
  Administered 2018-07-14: 1000 mg via INTRAVENOUS

## 2018-07-14 MED ORDER — FENTANYL CITRATE (PF) 100 MCG/2ML IJ SOLN
INTRAMUSCULAR | Status: AC
  Filled 2018-07-14: qty 2

## 2018-07-14 MED ORDER — DOCUSATE SODIUM 100 MG PO CAPS
100.0000 mg | ORAL_CAPSULE | Freq: Two times a day (BID) | ORAL | Status: DC
  Administered 2018-07-14 – 2018-07-16 (×4): 100 mg via ORAL
  Filled 2018-07-14 (×5): qty 1

## 2018-07-14 MED ORDER — METHOCARBAMOL 1000 MG/10ML IJ SOLN
500.0000 mg | Freq: Four times a day (QID) | INTRAVENOUS | Status: DC | PRN
  Filled 2018-07-14: qty 5

## 2018-07-14 MED ORDER — MAGNESIUM CITRATE PO SOLN: 1.0000 | Freq: Once | ORAL | Status: DC | PRN

## 2018-07-14 MED ORDER — PRAVASTATIN SODIUM 10 MG PO TABS: 20.0000 mg | ORAL_TABLET | Freq: Every day | ORAL | Status: DC

## 2018-07-14 MED ORDER — GLYCOPYRROLATE 0.2 MG/ML IJ SOLN
INTRAMUSCULAR | Status: DC | PRN
  Administered 2018-07-14 (×2): 0.1 mg via INTRAVENOUS

## 2018-07-14 MED ORDER — ACETAMINOPHEN 325 MG PO TABS
325.0000 mg | ORAL_TABLET | Freq: Four times a day (QID) | ORAL | Status: DC | PRN
  Administered 2018-07-15: 650 mg via ORAL
  Filled 2018-07-14: qty 2

## 2018-07-14 MED ORDER — BISACODYL 10 MG RE SUPP: 10.0000 mg | Freq: Every day | RECTAL | Status: DC | PRN

## 2018-07-14 MED ORDER — DEXAMETHASONE SODIUM PHOSPHATE 10 MG/ML IJ SOLN
INTRAMUSCULAR | Status: DC | PRN
  Administered 2018-07-14: 10 mg via INTRAVENOUS

## 2018-07-14 MED ORDER — FENTANYL CITRATE (PF) 250 MCG/5ML IJ SOLN
INTRAMUSCULAR | Status: DC | PRN
  Administered 2018-07-14: 50 ug via INTRAVENOUS
  Administered 2018-07-14: 75 ug via INTRAVENOUS
  Administered 2018-07-14 (×2): 25 ug via INTRAVENOUS
  Administered 2018-07-14 (×2): 50 ug via INTRAVENOUS
  Administered 2018-07-14: 25 ug via INTRAVENOUS
  Administered 2018-07-14: 75 ug via INTRAVENOUS
  Administered 2018-07-14 (×2): 50 ug via INTRAVENOUS
  Administered 2018-07-14: 25 ug via INTRAVENOUS

## 2018-07-14 MED ORDER — HYDROMORPHONE HCL 1 MG/ML IJ SOLN
INTRAMUSCULAR | Status: AC
  Filled 2018-07-14: qty 1

## 2018-07-14 MED ORDER — OXYCODONE HCL 5 MG PO TABS
10.0000 mg | ORAL_TABLET | ORAL | Status: DC | PRN
  Administered 2018-07-14: 15 mg via ORAL
  Administered 2018-07-14: 10 mg via ORAL
  Administered 2018-07-15 – 2018-07-16 (×9): 15 mg via ORAL
  Filled 2018-07-14 (×13): qty 3

## 2018-07-14 MED ORDER — KETAMINE HCL 100 MG/ML IJ SOLN
INTRAMUSCULAR | Status: AC
  Filled 2018-07-14: qty 1

## 2018-07-14 MED ORDER — ONDANSETRON HCL 4 MG PO TABS
4.0000 mg | ORAL_TABLET | Freq: Four times a day (QID) | ORAL | Status: DC | PRN
  Administered 2018-07-15: 4 mg via ORAL
  Filled 2018-07-14: qty 1

## 2018-07-14 MED ORDER — LOSARTAN POTASSIUM 50 MG PO TABS
100.0000 mg | ORAL_TABLET | Freq: Every day | ORAL | Status: DC
  Administered 2018-07-14 – 2018-07-16 (×3): 100 mg via ORAL
  Filled 2018-07-14 (×3): qty 2

## 2018-07-14 MED ORDER — ASPIRIN EC 325 MG PO TBEC
325.0000 mg | DELAYED_RELEASE_TABLET | Freq: Every day | ORAL | Status: DC
  Administered 2018-07-15 – 2018-07-16 (×2): 325 mg via ORAL
  Filled 2018-07-14 (×2): qty 1

## 2018-07-14 MED ORDER — PROPOFOL 10 MG/ML IV BOLUS
INTRAVENOUS | Status: AC
  Filled 2018-07-14: qty 40

## 2018-07-14 MED ORDER — TRANEXAMIC ACID-NACL 1000-0.7 MG/100ML-% IV SOLN
INTRAVENOUS | Status: AC
  Filled 2018-07-14: qty 100

## 2018-07-14 MED ORDER — LIDOCAINE 2% (20 MG/ML) 5 ML SYRINGE
INTRAMUSCULAR | Status: DC | PRN
  Administered 2018-07-14: 100 mg via INTRAVENOUS

## 2018-07-14 MED ORDER — METOCLOPRAMIDE HCL 5 MG PO TABS: 5.0000 mg | ORAL_TABLET | Freq: Three times a day (TID) | ORAL | Status: DC | PRN

## 2018-07-14 MED ORDER — MIDAZOLAM HCL 5 MG/5ML IJ SOLN
INTRAMUSCULAR | Status: DC | PRN
  Administered 2018-07-14: 2 mg via INTRAVENOUS

## 2018-07-14 MED ORDER — BISOPROLOL FUMARATE 10 MG PO TABS
10.0000 mg | ORAL_TABLET | Freq: Every day | ORAL | Status: DC
  Administered 2018-07-15: 10 mg via ORAL
  Filled 2018-07-14 (×2): qty 1

## 2018-07-14 MED ORDER — PRAVASTATIN SODIUM 10 MG PO TABS
20.0000 mg | ORAL_TABLET | Freq: Every day | ORAL | Status: DC
  Administered 2018-07-14 – 2018-07-15 (×2): 20 mg via ORAL
  Filled 2018-07-14 (×2): qty 2

## 2018-07-14 MED ORDER — POLYETHYLENE GLYCOL 3350 17 G PO PACK: 17.0000 g | PACK | Freq: Every day | ORAL | Status: DC | PRN

## 2018-07-14 MED ORDER — SODIUM CHLORIDE 0.9 % IV SOLN
INTRAVENOUS | Status: DC
  Administered 2018-07-14: 15:00:00 via INTRAVENOUS

## 2018-07-14 MED ORDER — METOCLOPRAMIDE HCL 5 MG/ML IJ SOLN: 5.0000 mg | Freq: Three times a day (TID) | INTRAMUSCULAR | Status: DC | PRN

## 2018-07-14 MED ORDER — 0.9 % SODIUM CHLORIDE (POUR BTL) OPTIME
TOPICAL | Status: DC | PRN
  Administered 2018-07-14: 1000 mL

## 2018-07-14 MED ORDER — MENTHOL 3 MG MT LOZG: 1.0000 | LOZENGE | OROMUCOSAL | Status: DC | PRN

## 2018-07-14 MED ORDER — GLYCOPYRROLATE PF 0.2 MG/ML IJ SOSY
PREFILLED_SYRINGE | INTRAMUSCULAR | Status: AC
  Filled 2018-07-14: qty 1

## 2018-07-14 MED ORDER — SODIUM CHLORIDE 0.9 % IR SOLN
Status: DC | PRN
  Administered 2018-07-14: 1

## 2018-07-14 MED ORDER — PROPOFOL 10 MG/ML IV BOLUS
INTRAVENOUS | Status: DC | PRN
  Administered 2018-07-14: 310 mg via INTRAVENOUS

## 2018-07-14 MED ORDER — FUROSEMIDE 20 MG PO TABS: 20.0000 mg | ORAL_TABLET | ORAL | Status: DC

## 2018-07-14 MED ORDER — HYDROMORPHONE HCL 1 MG/ML IJ SOLN: 0.5000 mg | INTRAMUSCULAR | Status: DC | PRN

## 2018-07-14 MED ORDER — LACTATED RINGERS IV SOLN: INTRAVENOUS | Status: DC

## 2018-07-14 MED ORDER — ROPIVACAINE HCL 5 MG/ML IJ SOLN
INTRAMUSCULAR | Status: DC | PRN
  Administered 2018-07-14: 30 mL via PERINEURAL

## 2018-07-14 MED ORDER — ONDANSETRON HCL 4 MG/2ML IJ SOLN: 4.0000 mg | Freq: Four times a day (QID) | INTRAMUSCULAR | Status: DC | PRN

## 2018-07-14 MED ORDER — MEPERIDINE HCL 50 MG/ML IJ SOLN: 6.2500 mg | INTRAMUSCULAR | Status: DC | PRN

## 2018-07-14 MED ORDER — PHENOL 1.4 % MT LIQD: 1.0000 | OROMUCOSAL | Status: DC | PRN

## 2018-07-14 MED ORDER — OXYCODONE HCL 5 MG PO TABS: 5.0000 mg | ORAL_TABLET | ORAL | Status: DC | PRN

## 2018-07-14 MED ORDER — DIPHENHYDRAMINE HCL 12.5 MG/5ML PO ELIX: 12.5000 mg | ORAL_SOLUTION | ORAL | Status: DC | PRN

## 2018-07-14 MED ORDER — KETAMINE HCL 100 MG/ML IJ SOLN
INTRAMUSCULAR | Status: DC | PRN
  Administered 2018-07-14: 20 mg via INTRAVENOUS
  Administered 2018-07-14: 30 mg via INTRAVENOUS

## 2018-07-14 MED ORDER — CHLORHEXIDINE GLUCONATE 4 % EX LIQD: 60.0000 mL | Freq: Once | CUTANEOUS | Status: DC

## 2018-07-14 MED ORDER — PROMETHAZINE HCL 25 MG/ML IJ SOLN: 6.2500 mg | INTRAMUSCULAR | Status: DC | PRN

## 2018-07-14 MED ORDER — HYDROCHLOROTHIAZIDE 25 MG PO TABS
25.0000 mg | ORAL_TABLET | Freq: Every day | ORAL | Status: DC
  Administered 2018-07-14 – 2018-07-16 (×3): 25 mg via ORAL
  Filled 2018-07-14 (×3): qty 1

## 2018-07-14 MED ORDER — ONDANSETRON HCL 4 MG/2ML IJ SOLN
INTRAMUSCULAR | Status: DC | PRN
  Administered 2018-07-14: 4 mg via INTRAVENOUS

## 2018-07-14 MED ORDER — TOPIRAMATE 25 MG PO TABS
50.0000 mg | ORAL_TABLET | Freq: Two times a day (BID) | ORAL | Status: DC
  Administered 2018-07-14 – 2018-07-16 (×4): 50 mg via ORAL
  Filled 2018-07-14 (×4): qty 2

## 2018-07-14 MED ORDER — DEXTROSE 5 % IV SOLN
3.0000 g | INTRAVENOUS | Status: AC
  Administered 2018-07-14: 3 g via INTRAVENOUS
  Filled 2018-07-14: qty 3

## 2018-07-14 MED ORDER — LACTATED RINGERS IV SOLN
INTRAVENOUS | Status: DC
  Administered 2018-07-14: 09:00:00 via INTRAVENOUS

## 2018-07-14 SURGICAL SUPPLY — 54 items
ATTUNE MED DOME PAT 41 KNEE (Knees) ×2 IMPLANT
ATTUNE MED DOME PAT 41MM KNEE (Knees) ×1 IMPLANT
ATTUNE PS FEM LT CEM SZ9 KNEE (Femur) ×3 IMPLANT
BASEPLATE TIB CMT FB PCKT SZ 9 (Knees) ×3 IMPLANT
BLADE SAGITTAL 25.0X1.19X90 (BLADE) ×2 IMPLANT
BLADE SAGITTAL 25.0X1.19X90MM (BLADE) ×1
BLADE SAW SGTL 13X75X1.27 (BLADE) ×3 IMPLANT
BLADE SURG 21 STRL SS (BLADE) ×6 IMPLANT
BNDG COHESIVE 6X5 TAN STRL LF (GAUZE/BANDAGES/DRESSINGS) ×6 IMPLANT
BNDG GAUZE ELAST 4 BULKY (GAUZE/BANDAGES/DRESSINGS) ×3 IMPLANT
BOWL SMART MIX CTS (DISPOSABLE) ×3 IMPLANT
CEMENT BONE R 1X40 (Cement) ×6 IMPLANT
COVER SURGICAL LIGHT HANDLE (MISCELLANEOUS) ×3 IMPLANT
COVER WAND RF STERILE (DRAPES) ×3 IMPLANT
CUFF TOURNIQUET SINGLE 34IN LL (TOURNIQUET CUFF) ×3 IMPLANT
CUFF TOURNIQUET SINGLE 44IN (TOURNIQUET CUFF) IMPLANT
DRAPE EXTREMITY T 121X128X90 (DRAPE) ×3 IMPLANT
DRAPE HALF SHEET 40X57 (DRAPES) ×6 IMPLANT
DRAPE U-SHAPE 47X51 STRL (DRAPES) ×3 IMPLANT
DRESSING PREVENA PLUS CUSTOM (GAUZE/BANDAGES/DRESSINGS) ×1 IMPLANT
DRSG ADAPTIC 3X8 NADH LF (GAUZE/BANDAGES/DRESSINGS) ×3 IMPLANT
DRSG PAD ABDOMINAL 8X10 ST (GAUZE/BANDAGES/DRESSINGS) ×3 IMPLANT
DRSG PREVENA PLUS CUSTOM (GAUZE/BANDAGES/DRESSINGS) ×3
DURAPREP 26ML APPLICATOR (WOUND CARE) ×3 IMPLANT
ELECT REM PT RETURN 9FT ADLT (ELECTROSURGICAL) ×3
ELECTRODE REM PT RTRN 9FT ADLT (ELECTROSURGICAL) ×1 IMPLANT
FACESHIELD WRAPAROUND (MASK) ×3 IMPLANT
GAUZE SPONGE 4X4 12PLY STRL (GAUZE/BANDAGES/DRESSINGS) ×3 IMPLANT
GLOVE BIOGEL PI IND STRL 9 (GLOVE) ×1 IMPLANT
GLOVE BIOGEL PI INDICATOR 9 (GLOVE) ×2
GLOVE SURG ORTHO 9.0 STRL STRW (GLOVE) ×3 IMPLANT
GOWN STRL REUS W/ TWL XL LVL3 (GOWN DISPOSABLE) ×2 IMPLANT
GOWN STRL REUS W/TWL XL LVL3 (GOWN DISPOSABLE) ×4
HANDPIECE INTERPULSE COAX TIP (DISPOSABLE) ×2
INSERT TIB KNEE ATTUNE POST 9 (Knees) ×3 IMPLANT
KIT BASIN OR (CUSTOM PROCEDURE TRAY) ×3 IMPLANT
KIT TURNOVER KIT B (KITS) ×3 IMPLANT
MANIFOLD NEPTUNE II (INSTRUMENTS) ×3 IMPLANT
NS IRRIG 1000ML POUR BTL (IV SOLUTION) ×3 IMPLANT
PACK TOTAL JOINT (CUSTOM PROCEDURE TRAY) ×3 IMPLANT
PAD ARMBOARD 7.5X6 YLW CONV (MISCELLANEOUS) ×3 IMPLANT
PIN STEINMAN FIXATION KNEE (PIN) ×3 IMPLANT
PIN THREADED HEADED SIGMA (PIN) ×3 IMPLANT
SET HNDPC FAN SPRY TIP SCT (DISPOSABLE) ×1 IMPLANT
STAPLER VISISTAT 35W (STAPLE) ×3 IMPLANT
SUCTION FRAZIER HANDLE 10FR (MISCELLANEOUS)
SUCTION TUBE FRAZIER 10FR DISP (MISCELLANEOUS) IMPLANT
SUT VIC AB 0 CT1 27 (SUTURE) ×2
SUT VIC AB 0 CT1 27XBRD ANBCTR (SUTURE) ×1 IMPLANT
SUT VIC AB 1 CTX 36 (SUTURE)
SUT VIC AB 1 CTX36XBRD ANBCTR (SUTURE) IMPLANT
TOWEL OR 17X24 6PK STRL BLUE (TOWEL DISPOSABLE) ×3 IMPLANT
TOWEL OR 17X26 10 PK STRL BLUE (TOWEL DISPOSABLE) ×3 IMPLANT
WRAP KNEE MAXI GEL POST OP (GAUZE/BANDAGES/DRESSINGS) ×3 IMPLANT

## 2018-07-14 NOTE — Transfer of Care (Signed)
Immediate Anesthesia Transfer of Care Note  Patient: [place] MiyamotoJames [name]  Procedure(s) Performed: LEFT TOTAL KNEE ARTHROPLASTY (Left Knee)  Patient Location: PACU  Anesthesia Type:GA combined with regional for post-op pain  Level of Consciousness: awake, alert , oriented and patient cooperative  Airway & Oxygen Therapy: Patient Spontanous Breathing and Patient connected to face mask oxygen  Post-op Assessment: Report given to RN and Post -op Vital signs reviewed and stable  Post vital signs: Reviewed and stable  Last Vitals:  Vitals Value Taken Time  BP 139/76 07/14/2018 11:26 AM  Temp    Pulse 69 07/14/2018 11:31 AM  Resp 14 07/14/2018 11:31 AM  SpO2 99 % 07/14/2018 11:31 AM  Vitals shown include unvalidated device data.  Last Pain:  Vitals:   07/14/18 0805  TempSrc:   PainSc: 0-No pain      Patients Stated Pain Goal: 3 (07/14/18 0805)  Complications: No apparent anesthesia complications

## 2018-07-14 NOTE — Anesthesia Procedure Notes (Signed)
Anesthesia Regional Block: Adductor canal block   Pre-Anesthetic Checklist: ,, timeout performed, Correct Patient, Correct Site, Correct Laterality, Correct Procedure, Correct Position, site marked, Risks and benefits discussed,  Surgical consent,  Pre-op evaluation,  At surgeon's request and post-op pain management  Laterality: Left  Prep: chloraprep       Needles:  Injection technique: Single-shot  Needle Type: Echogenic Stimulator Needle     Needle Length: 9cm  Needle Gauge: 21     Additional Needles:   Procedures:,,,, ultrasound used (permanent image in chart),,,,  Narrative:  Start time: 07/14/2018 9:05 AM End time: 07/14/2018 9:15 AM Injection made incrementally with aspirations every 5 mL.  Performed by: Personally  Anesthesiologist: Shelton SilvasHollis, Kosisochukwu Burningham D, MD  Additional Notes: Patient tolerated the procedure well. Local anesthetic introduced in an incremental fashion under minimal resistance after negative aspirations. No paresthesias were elicited. After completion of the procedure, no acute issues were identified and patient continued to be monitored by RN.
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
## 2018-07-14 NOTE — Evaluation (Signed)
Physical Therapy Evaluation Patient Details Name: Bill [name] MRN: [id number] DOB: [birth year] Today's Date: 07/14/2018   History of Present Illness  Pt is a 52 y/o male s/p L TKA due to trauma. PMH including but not limited to HTN, HLD and obesity.  Clinical Impression  Pt presented supine in bed with HOB elevated, awake and willing to participate in therapy session. Prior to admission, pt was ambulating with use of a cane and independent with ADLs. Pt lives in a single level home with four steps to enter. His wife will be able to provide 24/7 supervision/assistance upon d/c. Pt currently requires mod A for bed mobility and min A x2 for safety for transfers from an elevated bed position. Pt would continue to benefit from skilled physical therapy services at this time while admitted and after d/c to address the below listed limitations in order to improve overall safety and independence with functional mobility.     Follow Up Recommendations Supervision/Assistance - 24 hour;Home health PT    Equipment Recommendations  None recommended by PT    Recommendations for Other Services       Precautions / Restrictions Precautions Precautions: Fall;Knee;Other (comment)(wound VAC) Precaution Comments: reviewed LE positioning following knee surgery with pt and spouse Restrictions Weight Bearing Restrictions: Yes LLE Weight Bearing: Weight bearing as tolerated      Mobility  Bed Mobility Overal bed mobility: Needs Assistance Bed Mobility: Supine to Sit;Sit to Supine     Supine to sit: Mod assist Sit to supine: Mod assist   General bed mobility comments: increased time and effort, mod A for L LE movement off of and onto bed  Transfers Overall transfer level: Needs assistance Equipment used: Rolling walker (2 wheeled) Transfers: Sit to/from Stand Sit to Stand: Min assist;+2 safety/equipment;From elevated surface         General transfer comment: bed in elevated position,  pt with one UE on RW and on UE on bed, maintaining L LE outside of base of RW despite max cueing (pt very particular about technique)  Ambulation/Gait             General Gait Details: pt unsteady and standing, a bit impulsive and not safe to ambulate with one therapist  Stairs            Wheelchair Mobility    Modified Rankin (Stroke Patients Only)       Balance Overall balance assessment: Needs assistance Sitting-balance support: Feet supported Sitting balance-Leahy Scale: Good     Standing balance support: During functional activity;Bilateral upper extremity supported Standing balance-Leahy Scale: Poor Standing balance comment: reliant on bilateral UEs on RW                             Pertinent Vitals/Pain Pain Assessment: Faces Faces Pain Scale: Hurts little more Pain Location: L knee Pain Descriptors / Indicators: Sore;Guarding Pain Intervention(s): Monitored during session;Repositioned    Home Living Family/patient expects to be discharged to:: Private residence Living Arrangements: Spouse/significant other;Children Available Help at Discharge: Family;Available 24 hours/day Type of Home: House Home Access: Stairs to enter Entrance Stairs-Rails: Right;Left;Can reach both Entrance Stairs-Number of Steps: 4 Home Layout: One level Home Equipment: Walker - 2 wheels;Bedside commode;Shower seat;Grab bars - tub/shower      Prior Function Level of Independence: Independent with assistive device(s)         Comments: ambulates with use cane     Hand Dominance  Extremity/Trunk Assessment   Upper Extremity Assessment Upper Extremity Assessment: Overall WFL for tasks assessed    Lower Extremity Assessment Lower Extremity Assessment: LLE deficits/detail LLE Deficits / Details: pt with decreased strength and AROM limitations secondary to post-op pain and weakness. Sensation grossly intact LLE: Unable to fully assess due to  pain LLE Coordination: decreased fine motor;decreased gross motor       Communication   Communication: No difficulties  Cognition Arousal/Alertness: Awake/alert Behavior During Therapy: Impulsive Overall Cognitive Status: Impaired/Different from baseline Area of Impairment: Safety/judgement;Problem solving                         Safety/Judgement: Decreased awareness of deficits;Decreased awareness of safety   Problem Solving: Requires verbal cues;Requires tactile cues        General Comments      Exercises     Assessment/Plan    PT Assessment Patient needs continued PT services  PT Problem List Decreased strength;Decreased range of motion;Decreased activity tolerance;Decreased balance;Decreased mobility;Decreased coordination;Decreased cognition;Decreased safety awareness;Decreased knowledge of use of DME;Decreased knowledge of precautions;Pain       PT Treatment Interventions DME instruction;Gait training;Functional mobility training;Stair training;Therapeutic activities;Therapeutic exercise;Balance training;Neuromuscular re-education;Patient/family education    PT Goals (Current goals can be found in the Care Plan section)  Acute Rehab PT Goals Patient Stated Goal: decrease pain PT Goal Formulation: With patient/family Time For Goal Achievement: 07/28/18 Potential to Achieve Goals: Good    Frequency 7X/week   Barriers to discharge        Co-evaluation               AM-PAC PT "6 Clicks" Mobility  Outcome Measure Help needed turning from your back to your side while in a flat bed without using bedrails?: A Lot Help needed moving from lying on your back to sitting on the side of a flat bed without using bedrails?: A Lot Help needed moving to and from a bed to a chair (including a wheelchair)?: A Little Help needed standing up from a chair using your arms (e.g., wheelchair or bedside chair)?: A Little Help needed to walk in hospital room?: A  Lot Help needed climbing 3-5 steps with a railing? : A Lot 6 Click Score: 14    End of Session   Activity Tolerance: Patient limited by fatigue;Patient limited by pain Patient left: in bed;with call bell/phone within reach;with family/visitor present Nurse Communication: Mobility status PT Visit Diagnosis: Other abnormalities of gait and mobility (R26.89);Pain Pain - Right/Left: Left Pain - part of body: Knee    Time: [id number] PT Time Calculation (min) (ACUTE ONLY): 42 min   Charges:   PT Evaluation $PT Eval Moderate Complexity: 1 Mod PT Treatments $Therapeutic Activity: 23-37 mins        Deborah ChalkJennifer Kailer Heindel, PT, DPT  Acute Rehabilitation Services Pager [phone] Office [phone]    Alessandra BevelsJennifer M Danette Weinfeld 07/14/2018, 4:59 PM

## 2018-07-14 NOTE — Anesthesia Procedure Notes (Signed)
Procedure Name: LMA Insertion Date/Time: 07/14/2018 9:43 AM Performed by: Ponciano OrtBrewer, Cylinda Santoli, CRNA Pre-anesthesia Checklist: Patient identified, Emergency Drugs available, Suction available and Patient being monitored Patient Re-evaluated:Patient Re-evaluated prior to induction Oxygen Delivery Method: Circle system utilized Preoxygenation: Pre-oxygenation with 100% oxygen Induction Type: IV induction Ventilation: Mask ventilation without difficulty and Mask ventilation throughout procedure LMA: LMA with gastric port inserted LMA Size: 5.0 Number of attempts: 1 Placement Confirmation: positive ETCO2 Tube secured with: Tape Dental Injury: Teeth and Oropharynx as per pre-operative assessment

## 2018-07-14 NOTE — Op Note (Signed)
DATE OF SURGERY:  07/14/2018  TIME: 3:02 PM  PATIENT NAME:  Bill [name]    AGE: 52 y.o.    PRE-OPERATIVE DIAGNOSIS:  Osteoarthritis Left Knee  POST-OPERATIVE DIAGNOSIS:  Osteoarthritis Left Knee  PROCEDURE:  Procedure(s): LEFT TOTAL KNEE ARTHROPLASTY Application of Praveena restore dressing  SURGEON: Aldean BakerMarcus Jesicca Dipierro  ASSISTANT: April Green  OPERATIVE IMPLANTS: Depuy , Posterior Stabilized.  Femur size 9, Tibia size 9, Patella size 41 3-peg oval button, with a 5 mm polyethylene insert.  @ENCIMAGES @       PREOPERATIVE INDICATIONS:   Bill [name] is a 52 y.o. year old male with end stage degenerative arthritis of the knee who failed conservative treatment and elected for Total Knee Arthroplasty.   The risks, benefits, and alternatives were discussed at length including but not limited to the risks of infection, bleeding, nerve injury, stiffness, blood clots, the need for revision surgery, cardiopulmonary complications, among others, and they were willing to proceed.  OPERATIVE DESCRIPTION:  The patient was brought to the operative room and placed in a supine position.  General anesthesia was administered.  IV antibiotics were given.  The lower extremity was prepped and draped in the usual sterile fashion.  Collier Flowersoban was used to cover all exposed skin. Time out was performed.    Anterior quadriceps tendon splitting approach was performed.  The patella was everted and osteophytes were removed.  The anterior horn of the medial and lateral meniscus was removed.   The distal femur was opened with the drill and the intramedullary distal femoral cutting jig was utilized, set at 5 degrees valgus resecting 9 mm off the distal femur.  Care was taken to protect the collateral ligaments.  Then the extramedullary tibial cutting jig was utilized set for 3 degree posterior slope.  Care was taken during the cut to protect the medial and collateral ligaments.  The proximal tibia  was removed along with the posterior horns of the menisci.  The PCL was sacrificed.    The extensor gap was measured and was approximately 5 mm.    The distal femoral sizing jig was applied, taking care to avoid notching.  Then the 4-in-1 cutting jig was applied and the anterior and posterior femur was cut, along with the chamfer cuts.  All posterior osteophytes were removed.  The flexion gap was then measured and was symmetric with the extension gap.  The distal femoral preparation using the appropriate jig to prepare the box.  The patella was then measured, and cut with the saw.    The proximal tibia sized and prepared accordingly with the reamer and the punch, and then all components were trialed with the poly insert.  The knee was found to have stable balance and full motion.  The knee was irrigated with normal saline and the knee was soaked with TXA.  The above named components were then cemented into place and all excess cement was removed.  The final polyethylene component was in place during cementation.  The knee was kept in extension until the cement hardened.  The knee was then taken through a range of motion and the patella tracked well and the knee irrigated copiously and the parapatellar and subcutaneous tissue closed with vicryl, and skin closed with staples..  A Praveena restore dressing was applied, this had a good suction fit, and patient  was taken to the PACU in stable  condition.  There were no complications.  Total tourniquet time was 0 minutes.

## 2018-07-14 NOTE — Anesthesia Postprocedure Evaluation (Signed)
Anesthesia Post Note  Patient: Bill MiyamotoJames William [name]  Procedure(s) Performed: LEFT TOTAL KNEE ARTHROPLASTY (Left Knee)     Patient location during evaluation: PACU Anesthesia Type: General Level of consciousness: awake and alert Pain management: pain level controlled Vital Signs Assessment: post-procedure vital signs reviewed and stable Respiratory status: spontaneous breathing, nonlabored ventilation, respiratory function stable and patient connected to nasal cannula oxygen Cardiovascular status: blood pressure returned to baseline and stable Postop Assessment: no apparent nausea or vomiting Anesthetic complications: no    Last Vitals:  Vitals:   07/14/18 1400 07/14/18 1419  BP:  136/73  Pulse:  66  Resp:  18  Temp: 36.7 C 36.7 C  SpO2:  98%                  Shelton SilvasKevin D Eloni Darius

## 2018-07-15 ENCOUNTER — Encounter (HOSPITAL_COMMUNITY): Payer: Self-pay | Admitting: Orthopedic Surgery

## 2018-07-15 DIAGNOSIS — I10 Essential (primary) hypertension: Secondary | ICD-10-CM | POA: Diagnosis present

## 2018-07-15 DIAGNOSIS — Z87891 Personal history of nicotine dependence: Secondary | ICD-10-CM | POA: Diagnosis not present

## 2018-07-15 DIAGNOSIS — Z6841 Body Mass Index (BMI) 40.0 and over, adult: Secondary | ICD-10-CM | POA: Diagnosis not present

## 2018-07-15 DIAGNOSIS — G4730 Sleep apnea, unspecified: Secondary | ICD-10-CM | POA: Diagnosis present

## 2018-07-15 DIAGNOSIS — E785 Hyperlipidemia, unspecified: Secondary | ICD-10-CM | POA: Diagnosis present

## 2018-07-15 DIAGNOSIS — M1712 Unilateral primary osteoarthritis, left knee: Secondary | ICD-10-CM | POA: Diagnosis present

## 2018-07-15 NOTE — Progress Notes (Signed)
Patient ID: Bill MiyamotoJames William Merta, male   DOB: 06-15-1966, 52 y.o.   MRN: [id number] Postoperative day 1 left total knee arthroplasty.  There is no drainage in the wound VAC canister.  The importance of knee extension was discussed with the patient.  Plan for discharge to home tomorrow.

## 2018-07-15 NOTE — Care Management Note (Signed)
Case Management Note  Patient Details  Name: Bill [name] MRN: [id number] Date of Birth: 06-11-1966  Subjective/Objective:  [age] yr.old gentleman s/p left total knee arthroplasty.                   Action/Plan: Case manager spoke with patient and wife concerning discharge plan and DME. They have RW/3in1 at home. Patient has [employer] insurance, [facility] [place], with no success. Per Dr. Lajoyce Cornersuda patient needs Home Health, not able to go directly to outpatient. CM contacted [employer] and stressed this. InatkeID#: All information faxed to [facility] . CM spoke with [name], a RN CM with [employer] [phone]  who contacted [facility] to confirm [facility]       Expected Discharge Date:                  Expected Discharge Plan:  Home w Home Health Services  In-House Referral:  NA  Discharge planning Services  CM Consult  Post Acute Care Choice:  Home Health Choice offered to:  Patient  DME Arranged:  N/A(has DME) DME Agency:  NA  HH Arranged:  PT HH Agency:  ([employer] to arrange agency)  Status of Service:  In process, will continue to follow  If discussed at Long Length of Stay Meetings, dates discussed:    Additional Comments:  [name], [name], RN 07/15/2018, 2:26 PM

## 2018-07-15 NOTE — Progress Notes (Signed)
Physical Therapy Treatment Patient Details Name: Bill MiyamotoJames William [name] MRN: [id number] DOB: September 30, 1965 Today's Date: 07/15/2018    History of Present Illness Pt is a 52 y/o male s/p L TKA due to trauma. PMH including but not limited to HTN, HLD and obesity.    PT Comments    Patient seen for mobility progression. Pt requires min A for bed mobility with HOB elevated and use of rails and min A for sit to stand transfer from elevated bed height. Pt reports he will likely sleep in lift chair initially at home.  Pt tolerated gait training for ~90 ft and wife pushed chair behind for return to room. Continue to progress as tolerated.   Follow Up Recommendations  Supervision/Assistance - 24 hour;Home health PT     Equipment Recommendations  None recommended by PT    Recommendations for Other Services       Precautions / Restrictions Precautions Precautions: Fall;Knee;Other (comment)(wound VAC) Precaution Comments: precautions and positioning reviewed with pt and pt's wife Restrictions Weight Bearing Restrictions: Yes LLE Weight Bearing: Weight bearing as tolerated    Mobility  Bed Mobility Overal bed mobility: Needs Assistance Bed Mobility: Supine to Sit     Supine to sit: Min assist;HOB elevated     General bed mobility comments: assist to bring L LE to EOB; heavy use of rails and HOB elevated; pt reports he will likely sleep in lift chair  Transfers Overall transfer level: Needs assistance Equipment used: Rolling walker (2 wheeled) Transfers: Sit to/from Stand Sit to Stand: Min assist;From elevated surface         General transfer comment: assist to power up and to steady; cues for positioning and safe hand placement prior to stand  Ambulation/Gait Ambulation/Gait assistance: Min assist;+2 safety/equipment Gait Distance (Feet): 90 Feet Assistive device: Rolling walker (2 wheeled) Gait Pattern/deviations: Step-through pattern;Decreased stance time - left;Decreased  step length - right;Decreased dorsiflexion - left;Decreased weight shift to left;Antalgic;Trunk flexed Gait velocity: decreased   General Gait Details: heavy reliance on bilat UE support; cues for safe proximity to RW, L heel strike/toe off, and L quad activation during stance phase   Stairs             Wheelchair Mobility    Modified Rankin (Stroke Patients Only)       Balance Overall balance assessment: Needs assistance Sitting-balance support: Feet supported Sitting balance-Leahy Scale: Good     Standing balance support: During functional activity;Bilateral upper extremity supported Standing balance-Leahy Scale: Poor Standing balance comment: reliant on bilateral UEs on RW                            Cognition Arousal/Alertness: Awake/alert Behavior During Therapy: WFL for tasks assessed/performed Overall Cognitive Status: Impaired/Different from baseline                                        Exercises      General Comments General comments (skin integrity, edema, etc.): wife present and supportive       Pertinent Vitals/Pain Pain Assessment: Faces Faces Pain Scale: Hurts little more Pain Location: L knee Pain Descriptors / Indicators: Sore;Guarding Pain Intervention(s): Limited activity within patient's tolerance;Monitored during session;Premedicated before session;Repositioned    Home Living                      Prior  Function            PT Goals (current goals can now be found in the care plan section) Acute Rehab PT Goals Patient Stated Goal: decrease pain Progress towards PT goals: Progressing toward goals    Frequency    7X/week      PT Plan Current plan remains appropriate    Co-evaluation              AM-PAC PT "6 Clicks" Mobility   Outcome Measure  Help needed turning from your back to your side while in a flat bed without using bedrails?: A Lot Help needed moving from lying on your  back to sitting on the side of a flat bed without using bedrails?: A Lot Help needed moving to and from a bed to a chair (including a wheelchair)?: A Little Help needed standing up from a chair using your arms (e.g., wheelchair or bedside chair)?: A Little Help needed to walk in hospital room?: A Little Help needed climbing 3-5 steps with a railing? : A Lot 6 Click Score: 15    End of Session Equipment Utilized During Treatment: Gait belt Activity Tolerance: Patient tolerated treatment well Patient left: with call bell/phone within reach;with family/visitor present;in chair Nurse Communication: Mobility status PT Visit Diagnosis: Other abnormalities of gait and mobility (R26.89);Pain Pain - Right/Left: Left Pain - part of body: Knee     Time: [id number] PT Time Calculation (min) (ACUTE ONLY): 33 min  Charges:  $Gait Training: 23-37 mins                     Erline LevineKellyn Erin Uecker, PTA Acute Rehabilitation Services Pager: [phone] Office: [phone]     Carolynne EdouardKellyn R Ewa Hipp 07/15/2018, 3:06 PM

## 2018-07-15 NOTE — Progress Notes (Addendum)
Physical Therapy Treatment Patient Details Name: Bill [name] MRN: [id number] DOB: [birth year] Today's Date: 07/15/2018    History of Present Illness Pt is a 52 y/o male s/p L TKA due to trauma. PMH including but not limited to HTN, HLD and obesity.    PT Comments    Patient seen for strengthening/ROM exercises. Pt is able to complete all supine LE exercises this session and HEP handout/frequency reviewed with pt and pt's wife. Continue to progress as tolerated.    Follow Up Recommendations  Supervision/Assistance - 24 hour;Home health PT     Equipment Recommendations  None recommended by PT    Recommendations for Other Services       Precautions / Restrictions Precautions Precautions: Fall;Knee;Other (comment)(wound VAC) Precaution Comments: precautions and positioning reviewed with pt and pt's wife Restrictions Weight Bearing Restrictions: Yes LLE Weight Bearing: Weight bearing as tolerated           Cognition Arousal/Alertness: Awake/alert Behavior During Therapy: WFL for tasks assessed/performed Overall Cognitive Status: Within Functional Limits for tasks assessed                                        Exercises Total Joint Exercises Ankle Circles/Pumps: AROM;Both Quad Sets: AROM;Strengthening;Both Towel Squeeze: Strengthening Short Arc Quad: AAROM;Strengthening;Left Heel Slides: AAROM;Strengthening;Left Hip ABduction/ADduction: AAROM;Strengthening;Left Straight Leg Raises: AAROM;Strengthening;Left    General Comments General comments (skin integrity, edema, etc.): wife present      Pertinent Vitals/Pain Pain Assessment: Faces Faces Pain Scale: Hurts even more Pain Location: L knee with exercises Pain Descriptors / Indicators: Sore;Guarding;Grimacing Pain Intervention(s): Limited activity within patient's tolerance;Monitored during session;Repositioned;RN gave pain meds during session;Ice applied    Home Living                      Prior Function            PT Goals (current goals can now be found in the care plan section) Acute Rehab PT Goals Patient Stated Goal: decrease pain Progress towards PT goals: Progressing toward goals    Frequency    7X/week      PT Plan Current plan remains appropriate    Co-evaluation              AM-PAC PT "6 Clicks" Mobility   Outcome Measure  Help needed turning from your back to your side while in a flat bed without using bedrails?: A Lot Help needed moving from lying on your back to sitting on the side of a flat bed without using bedrails?: A Lot Help needed moving to and from a bed to a chair (including a wheelchair)?: A Little Help needed standing up from a chair using your arms (e.g., wheelchair or bedside chair)?: A Little Help needed to walk in hospital room?: A Little Help needed climbing 3-5 steps with a railing? : A Lot 6 Click Score: 15    End of Session Equipment Utilized During Treatment: Gait belt Activity Tolerance: Patient tolerated treatment well Patient left: with call bell/phone within reach;with family/visitor present;in bed Nurse Communication: Mobility status PT Visit Diagnosis: Other abnormalities of gait and mobility (R26.89);Pain Pain - Right/Left: Left Pain - part of body: Knee     Time: [id number] PT Time Calculation (min) (ACUTE ONLY): 31 min  Charges:   $Therapeutic Exercise: 23-37 mins  Erline LevineKellyn Anyae Griffith, PTA Acute Rehabilitation Services Pager: [phone] Office: [phone]     Carolynne EdouardKellyn R Kiamesha Samet 07/15/2018, 4:39 PM

## 2018-07-15 NOTE — Plan of Care (Signed)

## 2018-07-16 MED ORDER — BISACODYL 10 MG RE SUPP: 10.0000 mg | Freq: Every day | RECTAL | 0 refills | Status: DC | PRN

## 2018-07-16 MED ORDER — DOCUSATE SODIUM 100 MG PO CAPS: 100.0000 mg | ORAL_CAPSULE | Freq: Two times a day (BID) | ORAL | 0 refills | Status: DC

## 2018-07-16 MED ORDER — POLYETHYLENE GLYCOL 3350 17 G PO PACK: 17.0000 g | PACK | Freq: Every day | ORAL | 0 refills | Status: DC | PRN

## 2018-07-16 MED ORDER — DIPHENHYDRAMINE HCL 12.5 MG/5ML PO ELIX: 12.5000 mg | ORAL_SOLUTION | ORAL | 0 refills | Status: DC | PRN

## 2018-07-16 MED ORDER — OXYCODONE-ACETAMINOPHEN 10-325 MG PO TABS: 1.0000 | ORAL_TABLET | ORAL | 0 refills | Status: DC | PRN

## 2018-07-16 NOTE — Progress Notes (Signed)
Nsg Discharge Note  Admit Date:  07/14/2018 Discharge date: 07/16/2018   Abigail MiyamotoJames William Crance to be D/C'd Home per MD order.  AVS completed.  Copy for chart, and copy for patient signed, and dated. Patient/caregiver able to verbalize understanding.  Discharge Medication: Allergies as of 07/16/2018   No Known Allergies     Medication List    STOP taking these medications   acetaminophen 650 MG CR tablet Commonly known as:  TYLENOL     TAKE these medications   aspirin EC 81 MG tablet Take 81 mg by mouth daily.   bisacodyl 10 MG suppository Commonly known as:  DULCOLAX Place 1 suppository (10 mg total) rectally daily as needed for moderate constipation.   bisoprolol 10 MG tablet Commonly known as:  ZEBETA Take 10 mg by mouth at bedtime.   diclofenac 75 MG EC tablet Commonly known as:  VOLTAREN Take 75 mg by mouth 2 (two) times daily with a meal.   diphenhydrAMINE 12.5 MG/5ML elixir Commonly known as:  BENADRYL Take 5-10 mLs (12.5-25 mg total) by mouth every 4 (four) hours as needed for itching.   docusate sodium 100 MG capsule Commonly known as:  COLACE Take 1 capsule (100 mg total) by mouth 2 (two) times daily.   furosemide 20 MG tablet Commonly known as:  LASIX Take 20 mg by mouth See admin instructions. Mon, Wed, Thur, and Sat   hydrochlorothiazide 25 MG tablet Commonly known as:  HYDRODIURIL Take 25 mg by mouth daily.   losartan 100 MG tablet Commonly known as:  COZAAR Take 100 mg by mouth daily.   oxyCODONE-acetaminophen 10-325 MG tablet Commonly known as:  PERCOCET Take 1 tablet by mouth every 4 (four) hours as needed for pain.   polyethylene glycol packet Commonly known as:  MIRALAX / GLYCOLAX Take 17 g by mouth daily as needed for mild constipation.   pravastatin 20 MG tablet Commonly known as:  PRAVACHOL Take 20 mg by mouth daily.   topiramate 50 MG tablet Commonly known as:  TOPAMAX Take 50 mg by mouth 2 (two) times daily.        Discharge Assessment: Vitals:   07/15/18 2011 07/16/18 0438  BP: (!) 115/95 (!) 106/50  Pulse: 91 77  Resp: 20 16  Temp: 98.6 F (37 C) 99.1 F (37.3 C)  SpO2: 96% 97%   Skin clean, dry and intact without evidence of skin break down, no evidence of skin tears noted. IV catheter discontinued intact. Site without signs and symptoms of complications - no redness or edema noted at insertion site, patient denies c/o pain - only slight tenderness at site.  Dressing with slight pressure applied.  D/c Instructions-Education: Discharge instructions given to patient/family with verbalized understanding. D/c education completed with patient/family including follow up instructions, medication list, d/c activities limitations if indicated, with other d/c instructions as indicated by MD - patient able to verbalize understanding, all questions fully answered. Patient instructed to return to ED, call 911, or call MD for any changes in condition.  Patient escorted via WC, and D/C home via private auto.  Nelda MarseilleGeorge G Rechel Delosreyes, RN 07/16/2018 11:09 AM

## 2018-07-16 NOTE — Discharge Summary (Signed)
Discharge Diagnoses:  Active Problems:   Unilateral primary osteoarthritis, left knee   Total knee replacement status, left   Surgeries: Procedure(s): LEFT TOTAL KNEE ARTHROPLASTY on 07/14/2018    Consultants:   Discharged Condition: Improved  Hospital Course: Bill [name] is an 52 y.o. male who was admitted 07/14/2018 with a chief complaint of left knee pain and decreased functional status, with a final diagnosis of Osteoarthritis Left Knee.  Patient was brought to the operating room on 07/14/2018 and underwent Procedure(s): LEFT TOTAL KNEE ARTHROPLASTY.    Patient was given perioperative antibiotics:  Anti-infectives (From admission, onward)   Start     Dose/Rate Route Frequency Ordered Stop   07/14/18 1430  ceFAZolin (ANCEF) IVPB 2g/100 mL premix     2 g 200 mL/hr over 30 Minutes Intravenous Every 6 hours 07/14/18 1416 07/14/18 2119   07/14/18 0745  ceFAZolin (ANCEF) 3 g in dextrose 5 % 50 mL IVPB     3 g 100 mL/hr over 30 Minutes Intravenous On call to O.R. 07/14/18 [id number] 07/14/18 0950    .  Patient was given sequential compression devices, early ambulation, and aspirin for DVT prophylaxis.  Recent vital signs:  Patient Vitals for the past 24 hrs:  BP Temp Temp src Pulse Resp SpO2  07/16/18 0438 (!) 106/50 99.1 F (37.3 C) Oral 77 16 97 %  07/15/18 2011 (!) 115/95 98.6 F (37 C) Oral 91 20 96 %  07/15/18 1738 100/67 98.4 F (36.9 C) Oral 71 18 95 %  07/15/18 0850 119/67 98.7 F (37.1 C) Oral 67 17 98 %  .  Recent laboratory studies: No results found.  Discharge Medications:   Allergies as of 07/16/2018   No Known Allergies     Medication List    STOP taking these medications   acetaminophen 650 MG CR tablet Commonly known as:  TYLENOL     TAKE these medications   aspirin EC 81 MG tablet Take 81 mg by mouth daily.   bisacodyl 10 MG suppository Commonly known as:  DULCOLAX Place 1 suppository (10 mg total) rectally daily as needed for  moderate constipation.   bisoprolol 10 MG tablet Commonly known as:  ZEBETA Take 10 mg by mouth at bedtime.   diclofenac 75 MG EC tablet Commonly known as:  VOLTAREN Take 75 mg by mouth 2 (two) times daily with a meal.   diphenhydrAMINE 12.5 MG/5ML elixir Commonly known as:  BENADRYL Take 5-10 mLs (12.5-25 mg total) by mouth every 4 (four) hours as needed for itching.   docusate sodium 100 MG capsule Commonly known as:  COLACE Take 1 capsule (100 mg total) by mouth 2 (two) times daily.   furosemide 20 MG tablet Commonly known as:  LASIX Take 20 mg by mouth See admin instructions. Mon, Wed, Thur, and Sat   hydrochlorothiazide 25 MG tablet Commonly known as:  HYDRODIURIL Take 25 mg by mouth daily.   losartan 100 MG tablet Commonly known as:  COZAAR Take 100 mg by mouth daily.   oxyCODONE-acetaminophen 10-325 MG tablet Commonly known as:  PERCOCET Take 1 tablet by mouth every 4 (four) hours as needed for pain.   polyethylene glycol packet Commonly known as:  MIRALAX / GLYCOLAX Take 17 g by mouth daily as needed for mild constipation.   pravastatin 20 MG tablet Commonly known as:  PRAVACHOL Take 20 mg by mouth daily.   topiramate 50 MG tablet Commonly known as:  TOPAMAX Take 50 mg by mouth 2 (two) times  daily.       Diagnostic Studies: No results found.  Patient benefited maximally from their hospital stay and there were no complications.     Disposition: Discharge disposition: 01-Home or Self Care      Discharge Instructions    Call MD / Call 911   Complete by:  As directed    If you experience chest pain or shortness of breath, CALL 911 and be transported to the hospital emergency room.  If you develope a fever above 101 F, pus (white drainage) or increased drainage or redness at the wound, or calf pain, call your surgeon's office.   Constipation Prevention   Complete by:  As directed    Drink plenty of fluids.  Prune juice may be helpful.  You may use  a stool softener, such as Colace (over the counter) 100 mg twice a day.  Use MiraLax (over the counter) for constipation as needed.   Diet - low sodium heart healthy   Complete by:  As directed    Do not put a pillow under the knee. Place it under the heel.   Complete by:  As directed    Increase activity slowly as tolerated   Complete by:  As directed      Follow-up Information    Nadara Mustarduda, Marcus V, MD In 1 week.   Specialty:  Orthopedic Surgery Contact information: [address] [place] [address] [phone]            Signed: Zandra AbtsRAYBURN,Merle Whitehorn,PA-C [facility] [phone]

## 2018-07-16 NOTE — Discharge Instructions (Signed)
INSTRUCTIONS AFTER JOINT REPLACEMENT   o Remove items at home which could result in a fall. This includes throw rugs or furniture in walking pathways o ICE to the affected joint every three hours while awake for 30 minutes at a time, for at least the first 3-5 days, and then as needed for pain and swelling.  Continue to use ice for pain and swelling. You may notice swelling that will progress down to the foot and ankle.  This is normal after surgery.  Elevate your leg when you are not up walking on it.   o Continue to use the breathing machine you got in the hospital (incentive spirometer) which will help keep your temperature down.  It is common for your temperature to cycle up and down following surgery, especially at night when you are not up moving around and exerting yourself.  The breathing machine keeps your lungs expanded and your temperature down.   DIET:  As you were doing prior to hospitalization, we recommend a well-balanced diet.  DRESSING / WOUND CARE / SHOWERING  Leave the wound VAC clean dry and in place.  Plug in the [facility] pump to ensure it stays charged.  This will be changed at follow-up in the office.  ACTIVITY  o Increase activity slowly as tolerated, but follow the weight bearing instructions below.   o No driving for 6 weeks or until further direction given by your physician.  You cannot drive while taking narcotics.  o No lifting or carrying greater than 10 lbs. until further directed by your surgeon. o Avoid periods of inactivity such as sitting longer than an hour when not asleep. This helps prevent blood clots.  o You may return to work once you are authorized by your doctor.     WEIGHT BEARING   Weight bearing as tolerated with assist device (walker, cane, etc) as directed, use it as long as suggested by your surgeon or therapist, typically at least 4-6 weeks.   EXERCISES  Results after joint replacement surgery are often greatly improved when you follow the  exercise, range of motion and muscle strengthening exercises prescribed by your doctor. Safety measures are also important to protect the joint from further injury. Any time any of these exercises cause you to have increased pain or swelling, decrease what you are doing until you are comfortable again and then slowly increase them. If you have problems or questions, call your caregiver or physical therapist for advice.   Rehabilitation is important following a joint replacement. After just a few days of immobilization, the muscles of the leg can become weakened and shrink (atrophy).  These exercises are designed to build up the tone and strength of the thigh and leg muscles and to improve motion. Often times heat used for twenty to thirty minutes before working out will loosen up your tissues and help with improving the range of motion but do not use heat for the first two weeks following surgery (sometimes heat can increase post-operative swelling).   These exercises can be done on a training (exercise) mat, on the floor, on a table or on a bed. Use whatever works the best and is most comfortable for you.    Use music or television while you are exercising so that the exercises are a pleasant break in your day. This will make your life better with the exercises acting as a break in your routine that you can look forward to.   Perform all exercises about fifteen  times, three times per day or as directed.  You should exercise both the operative leg and the other leg as well.  Exercises include:    Quad Sets - Tighten up the muscle on the front of the thigh (Quad) and hold for 5-10 seconds.    Straight Leg Raises - With your knee straight (if you were given a brace, keep it on), lift the leg to 60 degrees, hold for 3 seconds, and slowly lower the leg.  Perform this exercise against resistance later as your leg gets stronger.   Leg Slides: Lying on your back, slowly slide your foot toward your buttocks,  bending your knee up off the floor (only go as far as is comfortable). Then slowly slide your foot back down until your leg is flat on the floor again.   Angel Wings: Lying on your back spread your legs to the side as far apart as you can without causing discomfort.   Hamstring Strength:  Lying on your back, push your heel against the floor with your leg straight by tightening up the muscles of your buttocks.  Repeat, but this time bend your knee to a comfortable angle, and push your heel against the floor.  You may put a pillow under the heel to make it more comfortable if necessary.   A rehabilitation program following joint replacement surgery can speed recovery and prevent re-injury in the future due to weakened muscles. Contact your doctor or a physical therapist for more information on knee rehabilitation.    CONSTIPATION  Constipation is defined medically as fewer than three stools per week and severe constipation as less than one stool per week.  Even if you have a regular bowel pattern at home, your normal regimen is likely to be disrupted due to multiple reasons following surgery.  Combination of anesthesia, postoperative narcotics, change in appetite and fluid intake all can affect your bowels.   YOU MUST use at least one of the following options; they are listed in order of increasing strength to get the job done.  They are all available over the counter, and you may need to use some, POSSIBLY even all of these options:    Drink plenty of fluids (prune juice may be helpful) and high fiber foods Colace 100 mg by mouth twice a day  Senokot for constipation as directed and as needed Dulcolax (bisacodyl), take with full glass of water  Miralax (polyethylene glycol) once or twice a day as needed.  If you have tried all these things and are unable to have a bowel movement in the first 3-4 days after surgery call either your surgeon or your primary doctor.    If you experience loose stools  or diarrhea, hold the medications until you stool forms back up.  If your symptoms do not get better within 1 week or if they get worse, check with your doctor.  If you experience "the worst abdominal pain ever" or develop nausea or vomiting, please contact the office immediately for further recommendations for treatment.   ITCHING:  If you experience itching with your medications, try taking only a single pain pill, or even half a pain pill at a time.  You can also use Benadryl over the counter for itching or also to help with sleep.   TED HOSE STOCKINGS:  Use stockings on both legs until for at least 2 weeks or as directed by physician office. They may be removed at night for sleeping.  MEDICATIONS:  See  your medication summary on the After Visit Summary that nursing will review with you.  You may have some home medications which will be placed on hold until you complete the course of blood thinner medication.  It is important for you to complete the blood thinner medication as prescribed.  PRECAUTIONS:  If you experience chest pain or shortness of breath - call 911 immediately for transfer to the hospital emergency department.   If you develop a fever greater that 101 F, purulent drainage from wound, increased redness or drainage from wound, foul odor from the wound/dressing, or calf pain - CONTACT YOUR SURGEON.                                                   FOLLOW-UP APPOINTMENTS:  If you do not already have a post-op appointment, please call the office for an appointment to be seen by your surgeon.  Guidelines for how soon to be seen are listed in your After Visit Summary, but are typically between 1-4 weeks after surgery.  OTHER INSTRUCTIONS:   Knee Replacement:  Do not place pillow under knee, focus on keeping the knee straight while resting. CPM instructions: 0-90 degrees, 2 hours in the morning, 2 hours in the afternoon, and 2 hours in the evening. Place foam block, curve side up  under heel at all times except when in CPM or when walking.  DO NOT modify, tear, cut, or change the foam block in any way.  MAKE SURE YOU:   Understand these instructions.   Get help right away if you are not doing well or get worse.    Thank you for letting us be a part of your medical care team.  It is a privilege we respect greatly.  We hope these instructions will help you stay on track for a fast and full recovery!

## 2018-07-16 NOTE — Progress Notes (Signed)
Physical Therapy Treatment Patient Details Name: Bill MiyamotoJames William Sleeper MRN: [id number] DOB: [birth year] Today's Date: 07/16/2018    History of Present Illness Pt is a 52 y/o male s/p L TKA due to trauma. PMH including but not limited to HTN, HLD and obesity.    PT Comments    Pt supine in bed on arrival.  Pt attempting to bargain with PTA to stay in bed and do exercises only.  Pt required re-education of benefits of mobility and why it is important to mobilize multiple times throughout the day.  Pt is limited with activity tolerance and presents with dyspnea and diaphoresis during activity.  Pt denies dizziness.  Pt performed stair training with good success.  Post gait and stair training, PTA performed L LE exercises to improve strength and ROM.  Plan for return this pm to mobilize before returning home.  Pt appears to be self limiting.     Follow Up Recommendations  Supervision/Assistance - 24 hour;Home health PT     Equipment Recommendations  None recommended by PT    Recommendations for Other Services       Precautions / Restrictions Precautions Precautions: Fall;Knee;Other (comment) Precaution Comments: precautions and positioning reviewed with pt and pt's wife Restrictions Weight Bearing Restrictions: Yes LLE Weight Bearing: Weight bearing as tolerated    Mobility  Bed Mobility Overal bed mobility: Needs Assistance Bed Mobility: Supine to Sit     Supine to sit: Min assist;HOB elevated     General bed mobility comments: assist to bring L LE to EOB; heavy use of rails and HOB elevated; pt reports he will likely sleep in lift chair  Transfers Overall transfer level: Needs assistance Equipment used: Rolling walker (2 wheeled)(bariatric) Transfers: Sit to/from Stand Sit to Stand: Min assist;From elevated surface         General transfer comment: assist to power up and to steady; cues for positioning and safe hand placement prior to stand.  Pt requires rocking  mometum and increased flexion of hips to transition his weight over his LEs.    Ambulation/Gait Ambulation/Gait assistance: Min guard Gait Distance (Feet): 12 Feet(x2 to and from commode.  ) Assistive device: Rolling walker (2 wheeled)(bariatric) Gait Pattern/deviations: Step-through pattern;Decreased stance time - left;Decreased step length - right;Decreased dorsiflexion - left;Decreased weight shift to left;Antalgic;Trunk flexed Gait velocity: decreased   General Gait Details: Pt continues to present with heavy reliance on UEs and pushing RW too far forward.  Pt impulsive with movement and unsafe requiring cues to slow movement and maintain position in RW.    Stairs Stairs: Yes Stairs assistance: Min guard Stair Management: Two rails;Forwards Number of Stairs: 3 General stair comments: Cues for sequencing and hand placement on railings.  Educated patient to make sure RW is placed at the top of stairs once he is holding to railings for smoother transition.     Wheelchair Mobility    Modified Rankin (Stroke Patients Only)       Balance Overall balance assessment: Needs assistance   Sitting balance-Leahy Scale: Good     Standing balance support: During functional activity;Bilateral upper extremity supported Standing balance-Leahy Scale: Poor Standing balance comment: reliant on bilateral UEs on RW                            Cognition Arousal/Alertness: Awake/alert Behavior During Therapy: WFL for tasks assessed/performed Overall Cognitive Status: Within Functional Limits for tasks assessed Area of Impairment: Safety/judgement;Problem solving  Safety/Judgement: Decreased awareness of deficits;Decreased awareness of safety   Problem Solving: Requires verbal cues;Requires tactile cues        Exercises Total Joint Exercises Ankle Circles/Pumps: AROM;Both;10 reps;Supine Quad Sets: AROM;Left;10 reps;Supine Towel Squeeze:  AROM;Both;10 reps;Supine Short Arc Quad: Left;10 reps;Supine;AAROM Heel Slides: Left;10 reps;Supine;AAROM Hip ABduction/ADduction: AAROM;Left;10 reps;Supine Straight Leg Raises: AAROM;10 reps;Left;Supine Goniometric ROM: 20-60 degrees L knee.    General Comments        Pertinent Vitals/Pain Pain Assessment: 0-10 Pain Score: 6  Pain Location: L knee with movement.   Pain Descriptors / Indicators: Sore;Guarding;Grimacing Pain Intervention(s): Monitored during session;Repositioned    Home Living                      Prior Function            PT Goals (current goals can now be found in the care plan section) Acute Rehab PT Goals Patient Stated Goal: decrease pain Potential to Achieve Goals: Good Progress towards PT goals: Progressing toward goals    Frequency    7X/week      PT Plan Current plan remains appropriate    Co-evaluation              AM-PAC PT "6 Clicks" Mobility   Outcome Measure  Help needed turning from your back to your side while in a flat bed without using bedrails?: A Lot Help needed moving from lying on your back to sitting on the side of a flat bed without using bedrails?: A Lot Help needed moving to and from a bed to a chair (including a wheelchair)?: A Little Help needed standing up from a chair using your arms (e.g., wheelchair or bedside chair)?: A Little Help needed to walk in hospital room?: A Little Help needed climbing 3-5 steps with a railing? : A Lot 6 Click Score: 15    End of Session Equipment Utilized During Treatment: Gait belt Activity Tolerance: Patient tolerated treatment well Patient left: with call bell/phone within reach;with family/visitor present;in bed Nurse Communication: Mobility status PT Visit Diagnosis: Other abnormalities of gait and mobility (R26.89);Pain Pain - Right/Left: Left Pain - part of body: Knee     Time: 1104-1150 PT Time Calculation (min) (ACUTE ONLY): 46 min  Charges:  $Gait  Training: 8-22 mins $Therapeutic Exercise: 8-22 mins $Therapeutic Activity: 8-22 mins                     Joycelyn RuaAimee Rozanne Heumann, PTA Acute Rehabilitation Services Pager [phone] Office [phone]     Terriyah Westra Artis DelayJ Biana Haggar 07/16/2018, 12:23 PM

## 2018-07-16 NOTE — Progress Notes (Signed)
Patient ID: Bill MiyamotoJames William [name], male   DOB: [birth year], 52 y.o.   MRN: [id number] Anticipated LOS equal to or greater than 2 midnights due to - Age 52 and older with one or more of the following:  - Obesity  - Expected need for hospital services (PT, OT, Nursing) required for safe  discharge  - Anticipated need for postoperative skilled nursing care or inpatient rehab  - Active co-morbidities: morbid obesity OR   - Unanticipated findings during/Post Surgery: Slow post-op progression: GI, pain control, mobility  - Patient is a high risk of re-admission due to: Non-elective hospital admission within previous 6 months

## 2018-07-16 NOTE — Care Management (Signed)
Case manager received call from [employer] rep stating that they have located a Home Health Agency in [place] that will accept [employer]. CM called Tammy @ [employer] [phone], to confirm. Faxed Orders, H&P, OP note and demographics to her @ [phone]. Start of care expected to be Monday, 07/19/18. CM encouraged patient to continue doing his exercises as demonstrated by therapist and on handout.

## 2018-07-22 ENCOUNTER — Encounter (INDEPENDENT_AMBULATORY_CARE_PROVIDER_SITE_OTHER): Payer: Self-pay | Admitting: Physician Assistant

## 2018-07-22 ENCOUNTER — Ambulatory Visit (INDEPENDENT_AMBULATORY_CARE_PROVIDER_SITE_OTHER): Payer: Managed Care, Other (non HMO) | Admitting: Physician Assistant

## 2018-07-22 DIAGNOSIS — Z96652 Presence of left artificial knee joint: Secondary | ICD-10-CM

## 2018-07-22 MED ORDER — HYDROCODONE-ACETAMINOPHEN 5-325 MG PO TABS: 1.0000 | ORAL_TABLET | ORAL | 0 refills | Status: DC | PRN
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
## 2018-08-05 ENCOUNTER — Ambulatory Visit (INDEPENDENT_AMBULATORY_CARE_PROVIDER_SITE_OTHER): Payer: Managed Care, Other (non HMO) | Admitting: Physician Assistant

## 2018-08-05 ENCOUNTER — Encounter (INDEPENDENT_AMBULATORY_CARE_PROVIDER_SITE_OTHER): Payer: Self-pay | Admitting: Orthopedic Surgery

## 2018-08-05 ENCOUNTER — Ambulatory Visit (INDEPENDENT_AMBULATORY_CARE_PROVIDER_SITE_OTHER): Payer: Managed Care, Other (non HMO)

## 2018-08-05 VITALS — Ht 72.0 in | Wt >= 6400 oz

## 2018-08-05 DIAGNOSIS — Z6841 Body Mass Index (BMI) 40.0 and over, adult: Secondary | ICD-10-CM

## 2018-08-05 DIAGNOSIS — Z96652 Presence of left artificial knee joint: Secondary | ICD-10-CM

## 2018-08-05 MED ORDER — HYDROCODONE-ACETAMINOPHEN 5-325 MG PO TABS: 1.0000 | ORAL_TABLET | ORAL | 0 refills | Status: DC | PRN
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
## 2018-08-06 ENCOUNTER — Encounter (INDEPENDENT_AMBULATORY_CARE_PROVIDER_SITE_OTHER): Payer: Self-pay | Admitting: Physician Assistant

## 2018-09-02 ENCOUNTER — Encounter (INDEPENDENT_AMBULATORY_CARE_PROVIDER_SITE_OTHER): Payer: Self-pay | Admitting: Physician Assistant

## 2018-09-02 ENCOUNTER — Ambulatory Visit (INDEPENDENT_AMBULATORY_CARE_PROVIDER_SITE_OTHER): Payer: Managed Care, Other (non HMO) | Admitting: Physician Assistant

## 2018-09-02 VITALS — Ht 72.0 in | Wt >= 6400 oz

## 2018-09-02 DIAGNOSIS — Z96652 Presence of left artificial knee joint: Secondary | ICD-10-CM

## 2018-09-02 DIAGNOSIS — Z6841 Body Mass Index (BMI) 40.0 and over, adult: Secondary | ICD-10-CM
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
## 2018-09-08 ENCOUNTER — Telehealth (INDEPENDENT_AMBULATORY_CARE_PROVIDER_SITE_OTHER): Payer: Self-pay

## 2018-09-08 ENCOUNTER — Other Ambulatory Visit (INDEPENDENT_AMBULATORY_CARE_PROVIDER_SITE_OTHER): Payer: Self-pay | Admitting: Physician Assistant

## 2018-09-08 ENCOUNTER — Telehealth (INDEPENDENT_AMBULATORY_CARE_PROVIDER_SITE_OTHER): Payer: Self-pay | Admitting: Physician Assistant

## 2018-09-08 MED ORDER — HYDROCODONE-ACETAMINOPHEN 5-325 MG PO TABS: 1.0000 | ORAL_TABLET | Freq: Four times a day (QID) | ORAL | 0 refills | Status: DC | PRN

## 2018-09-08 NOTE — Telephone Encounter (Signed)
Done

## 2018-09-08 NOTE — Telephone Encounter (Signed)
Pt was in the office 09/02/2018 and was advised would refill rx for Norco. This was not entered in the chart can you please write?

## 2018-09-08 NOTE — Telephone Encounter (Signed)
Patient called stating he spoke with the pharmacy and the Rx for Hydrocodone has not been called into the pharmacy yet. Patient uses the [facility] on [address] in [place]. The number to contact patient is [phone]

## 2018-09-08 NOTE — Telephone Encounter (Signed)
Pt was called and informed Rx was sent to pharmacy.

## 2018-09-30 ENCOUNTER — Encounter (INDEPENDENT_AMBULATORY_CARE_PROVIDER_SITE_OTHER): Payer: Self-pay | Admitting: Physician Assistant

## 2018-09-30 ENCOUNTER — Ambulatory Visit (INDEPENDENT_AMBULATORY_CARE_PROVIDER_SITE_OTHER): Payer: Managed Care, Other (non HMO) | Admitting: Physician Assistant

## 2018-09-30 ENCOUNTER — Ambulatory Visit (INDEPENDENT_AMBULATORY_CARE_PROVIDER_SITE_OTHER): Payer: Managed Care, Other (non HMO)

## 2018-09-30 VITALS — Ht 72.0 in | Wt >= 6400 oz

## 2018-09-30 DIAGNOSIS — Z96652 Presence of left artificial knee joint: Secondary | ICD-10-CM

## 2018-09-30 DIAGNOSIS — M170 Bilateral primary osteoarthritis of knee: Secondary | ICD-10-CM

## 2018-09-30 MED ORDER — HYDROCODONE-ACETAMINOPHEN 5-325 MG PO TABS: 1.0000 | ORAL_TABLET | Freq: Four times a day (QID) | ORAL | 0 refills | Status: DC | PRN

## 2018-09-30 MED ORDER — DICLOFENAC SODIUM 1 % TD GEL: 2.0000 g | Freq: Four times a day (QID) | TRANSDERMAL | 3 refills | Status: DC | PRN
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
## 2018-10-01 ENCOUNTER — Encounter (INDEPENDENT_AMBULATORY_CARE_PROVIDER_SITE_OTHER): Payer: Self-pay | Admitting: Physician Assistant

## 2018-10-27 ENCOUNTER — Telehealth (INDEPENDENT_AMBULATORY_CARE_PROVIDER_SITE_OTHER): Payer: Self-pay

## 2018-10-27 NOTE — Telephone Encounter (Signed)
Pt was pre-screen for COVID-19 and answered no to all questions

## 2018-10-28 ENCOUNTER — Other Ambulatory Visit: Payer: Self-pay

## 2018-10-28 ENCOUNTER — Ambulatory Visit (INDEPENDENT_AMBULATORY_CARE_PROVIDER_SITE_OTHER): Payer: Managed Care, Other (non HMO)

## 2018-10-28 ENCOUNTER — Ambulatory Visit (INDEPENDENT_AMBULATORY_CARE_PROVIDER_SITE_OTHER): Payer: Managed Care, Other (non HMO) | Admitting: Orthopedic Surgery

## 2018-10-28 ENCOUNTER — Ambulatory Visit (INDEPENDENT_AMBULATORY_CARE_PROVIDER_SITE_OTHER): Payer: Self-pay

## 2018-10-28 ENCOUNTER — Encounter (INDEPENDENT_AMBULATORY_CARE_PROVIDER_SITE_OTHER): Payer: Self-pay | Admitting: Orthopedic Surgery

## 2018-10-28 VITALS — Ht 73.0 in | Wt >= 6400 oz

## 2018-10-28 DIAGNOSIS — M25562 Pain in left knee: Secondary | ICD-10-CM

## 2018-10-28 DIAGNOSIS — M25561 Pain in right knee: Secondary | ICD-10-CM

## 2018-10-28 DIAGNOSIS — Z96652 Presence of left artificial knee joint: Secondary | ICD-10-CM

## 2018-11-02 ENCOUNTER — Encounter (INDEPENDENT_AMBULATORY_CARE_PROVIDER_SITE_OTHER): Payer: Self-pay | Admitting: Orthopedic Surgery

## 2018-11-02 DIAGNOSIS — M25561 Pain in right knee: Secondary | ICD-10-CM

## 2018-11-02 MED ORDER — LIDOCAINE HCL 1 % IJ SOLN
5.0000 mL | INTRAMUSCULAR | Status: AC | PRN
  Administered 2018-11-02: 15:00:00 5 mL

## 2018-11-02 MED ORDER — METHYLPREDNISOLONE ACETATE 40 MG/ML IJ SUSP
40.0000 mg | INTRAMUSCULAR | Status: AC | PRN
  Administered 2018-11-02: 15:00:00 40 mg via INTRA_ARTICULAR
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
## 2018-12-21 ENCOUNTER — Other Ambulatory Visit: Payer: Self-pay

## 2019-01-04 ENCOUNTER — Encounter (HOSPITAL_COMMUNITY): Payer: Self-pay

## 2019-01-04 ENCOUNTER — Other Ambulatory Visit (HOSPITAL_COMMUNITY)
Admission: RE | Admit: 2019-01-04 | Discharge: 2019-01-04 | Disposition: A | Discharge status: Home / Self Care | Payer: Managed Care, Other (non HMO) | Source: Ambulatory Visit | Attending: Orthopedic Surgery | Admitting: Orthopedic Surgery

## 2019-01-04 ENCOUNTER — Other Ambulatory Visit: Payer: Self-pay

## 2019-01-04 ENCOUNTER — Encounter (HOSPITAL_COMMUNITY)
Admission: RE | Admit: 2019-01-04 | Discharge: 2019-01-04 | Disposition: A | Discharge status: Home / Self Care | Payer: Managed Care, Other (non HMO) | Source: Ambulatory Visit | Attending: Orthopedic Surgery | Admitting: Orthopedic Surgery

## 2019-01-04 LAB — BASIC METABOLIC PANEL
Anion gap: 9 (ref 5–15)
BUN: 18 mg/dL (ref 6–20)
CO2: 22 mmol/L (ref 22–32)
Calcium: 8.9 mg/dL (ref 8.9–10.3)
Chloride: 109 mmol/L (ref 98–111)
Creatinine, Ser: 1.37 mg/dL — ABNORMAL HIGH (ref 0.61–1.24)
GFR calc Af Amer: >60 mL/min (ref >60)
GFR calc non Af Amer: 58 mL/min — ABNORMAL LOW (ref >60)
Glucose, Bld: 103 mg/dL — ABNORMAL HIGH (ref 70–99)
Potassium: 3.5 mmol/L (ref 3.5–5.1)
Sodium: 140 mmol/L (ref 135–145)

## 2019-01-04 LAB — CBC
HCT: 47.3 % (ref 39.0–52.0)
Hemoglobin: 15.3 g/dL (ref 13.0–17.0)
MCH: 28.5 pg (ref 26.0–34.0)
MCHC: 32.3 g/dL (ref 30.0–36.0)
MCV: 88.2 fL (ref 80.0–100.0)
Platelets: 192 10*3/uL (ref 150–400)
RBC: 5.36 MIL/uL (ref 4.22–5.81)
RDW: 14.6 % (ref 11.5–15.5)
WBC: 5.7 10*3/uL (ref 4.0–10.5)
nRBC: 0 % (ref 0.0–0.2)

## 2019-01-04 LAB — SARS CORONAVIRUS 2 BY RT PCR (HOSPITAL ORDER, PERFORMED IN ~~LOC~~ HOSPITAL LAB): SARS Coronavirus 2: NEGATIVE

## 2019-01-04 LAB — SURGICAL PCR SCREEN
MRSA, PCR: NEGATIVE
Staphylococcus aureus: POSITIVE — AB

## 2019-01-04 MED ORDER — TRANEXAMIC ACID 1000 MG/10ML IV SOLN
2000.0000 mg | INTRAVENOUS | Status: AC
  Administered 2019-01-05: 2000 mg via TOPICAL
  Filled 2019-01-04: qty 20

## 2019-01-04 MED ORDER — DEXTROSE 5 % IV SOLN
3.0000 g | INTRAVENOUS | Status: AC
  Administered 2019-01-05: 3 g via INTRAVENOUS
  Filled 2019-01-04: qty 3

## 2019-01-04 MED ORDER — TRANEXAMIC ACID-NACL 1000-0.7 MG/100ML-% IV SOLN
1000.0000 mg | INTRAVENOUS | Status: AC
  Administered 2019-01-05: 09:00:00 1000 mg via INTRAVENOUS
  Filled 2019-01-04: qty 100

## 2019-01-04 NOTE — Progress Notes (Signed)
PCP -  Richardean Chimera, PA- C at [facility] - Dr Lupita Shutter  Chest x-ray - na  EKG - obtained from Dr Lupita Shutter  Stress Test -   ECHO -   Cardiac Cath -    Sleep Study - unsure when - it was positive CPAP - no  LABS- CB, BMP, PCR  ASA-no order to stopp  ERAS- yes plus 1 Ensure Pre- SUrgery  HA1C-na Fasting Blood Sugar - 0 Checks Blood Sugar __0__ times a day  Anesthesia- Karoline Caldwell, PA-C reviewing Pt denies having chest pain, sob, or fever at this time. All instructions explained to the pt, with a verbal understanding of the material. Pt agrees to go over the instructions while at home for a better understanding. The opportunity to ask questions was provided.  Mr Hoeffner denies that she or her family has experienced any of the following: Cough Fever >100.4 Runny Nose Sore Throat Difficulty breathing/ shortness of breath Travel in past 14 days- no Patient wil have COVID Test today and will go home and quarantine, "if I have to."

## 2019-01-04 NOTE — Anesthesia Preprocedure Evaluation (Addendum)
Anesthesia Evaluation  Patient identified by MRN, date of birth, ID band Patient awake    Reviewed: Allergy & Precautions, NPO status , Patient's Chart, lab work & pertinent test results  History of Anesthesia Complications (+) PONV  Airway Mallampati: III       Dental no notable dental hx. (+) Teeth Intact   Pulmonary former smoker,    Pulmonary exam normal breath sounds clear to auscultation       Cardiovascular hypertension, Pt. on medications Normal cardiovascular exam Rhythm:Regular Rate:Normal     Neuro/Psych negative psych ROS   GI/Hepatic negative GI ROS, Neg liver ROS,   Endo/Other  Morbid obesity  Renal/GU   negative genitourinary   Musculoskeletal  (+) Arthritis , Osteoarthritis,    Abdominal (+) + obese,   Peds  Hematology negative hematology ROS (+)   Anesthesia Other Findings   Reproductive/Obstetrics                                                            Anesthesia Evaluation  Patient identified by MRN, date of birth, ID band Patient awake    Reviewed: Allergy & Precautions, NPO status , Patient's Chart, lab work & pertinent test results, reviewed documented beta blocker date and time   History of Anesthesia Complications (+) PONV  Airway Mallampati: III  TM Distance: >3 FB Neck ROM: Full    Dental  (+) Teeth Intact, Dental Advisory Given   Pulmonary sleep apnea , former smoker,    breath sounds clear to auscultation       Cardiovascular hypertension, Pt. on medications and Pt. on home beta blockers + dysrhythmias Atrial Fibrillation  Rhythm:Regular Rate:Normal     Neuro/Psych negative neurological ROS  negative psych ROS   GI/Hepatic negative GI ROS, Neg liver ROS,   Endo/Other  negative endocrine ROS  Renal/GU      Musculoskeletal negative musculoskeletal ROS (+)   Abdominal (+) + obese,   Peds  Hematology negative  hematology ROS (+)   Anesthesia Other Findings   Reproductive/Obstetrics                           Lab Results  Component Value Date   WBC 5.7 01/04/2019   HGB 15.3 01/04/2019   HCT 47.3 01/04/2019   MCV 88.2 01/04/2019   PLT 192 01/04/2019   Lab Results  Component Value Date   CREATININE 1.37 (H) 01/04/2019   BUN 18 01/04/2019   NA 140 01/04/2019   K 3.5 01/04/2019   CL 109 01/04/2019   CO2 22 01/04/2019   Lab Results  Component Value Date   INR 1.07 05/30/2017   INR 1.78 05/15/2017   INR 1.62 05/14/2017   Echo (12/01/2016):  Normal LV systolic function, EF 60-65%, no segmental wall motion abnormalities   Anesthesia Physical Anesthesia Plan  ASA: III  Anesthesia Plan: General   Post-op Pain Management: GA combined w/ Regional for post-op pain   Induction: Intravenous  PONV Risk Score and Plan: 3 and Ondansetron, Dexamethasone and Midazolam  Airway Management Planned: LMA  Additional Equipment: None  Intra-op Plan:   Post-operative Plan: Extubation in OR  Informed Consent: I have reviewed the patients History and Physical, chart, labs and discussed the procedure including the risks, benefits and  alternatives for the proposed anesthesia with the patient or authorized representative who has indicated his/her understanding and acceptance.   Dental advisory given  Plan Discussed with: CRNA  Anesthesia Plan Comments: (See PAT note 07/07/2018 by Antionette PolesJames Burns, PA-C )      Anesthesia Quick Evaluation                                   Anesthesia Evaluation  Patient identified by MRN, date of birth, ID band Patient awake    Reviewed: Allergy & Precautions, NPO status , Patient's Chart, lab work & pertinent test results, reviewed documented beta blocker date and time   History of Anesthesia Complications (+) PONV  Airway Mallampati: III  TM Distance: >3 FB Neck ROM: Full    Dental  (+) Teeth Intact, Dental Advisory  Given   Pulmonary sleep apnea , former smoker,    breath sounds clear to auscultation       Cardiovascular hypertension, Pt. on medications and Pt. on home beta blockers + dysrhythmias Atrial Fibrillation  Rhythm:Regular Rate:Normal     Neuro/Psych negative neurological ROS  negative psych ROS   GI/Hepatic negative GI ROS, Neg liver ROS,   Endo/Other  negative endocrine ROS  Renal/GU      Musculoskeletal negative musculoskeletal ROS (+)   Abdominal (+) + obese,   Peds  Hematology negative hematology ROS (+)   Anesthesia Other Findings   Reproductive/Obstetrics                           Lab Results  Component Value Date   WBC 5.7 01/04/2019   HGB 15.3 01/04/2019   HCT 47.3 01/04/2019   MCV 88.2 01/04/2019   PLT 192 01/04/2019   Lab Results  Component Value Date   CREATININE 1.37 (H) 01/04/2019   BUN 18 01/04/2019   NA 140 01/04/2019   K 3.5 01/04/2019   CL 109 01/04/2019   CO2 22 01/04/2019   Lab Results  Component Value Date   INR 1.07 05/30/2017   INR 1.78 05/15/2017   INR 1.62 05/14/2017   Echo (12/01/2016):  Normal LV systolic function, EF 60-65%, no segmental wall motion abnormalities   Anesthesia Physical Anesthesia Plan  ASA: III  Anesthesia Plan: General   Post-op Pain Management: GA combined w/ Regional for post-op pain   Induction: Intravenous  PONV Risk Score and Plan: 3 and Ondansetron, Dexamethasone and Midazolam  Airway Management Planned: LMA  Additional Equipment: None  Intra-op Plan:   Post-operative Plan: Extubation in OR  Informed Consent: I have reviewed the patients History and Physical, chart, labs and discussed the procedure including the risks, benefits and alternatives for the proposed anesthesia with the patient or authorized representative who has indicated his/her understanding and acceptance.   Dental advisory given  Plan Discussed with: CRNA  Anesthesia Plan Comments:  (See PAT note 07/07/2018 by Antionette PolesJames Burns, PA-C )      Anesthesia Quick Evaluation  Anesthesia Physical Anesthesia Plan  ASA: III  Anesthesia Plan: General   Post-op Pain Management:  Regional for Post-op pain   Induction:   PONV Risk Score and Plan: 3 and Ondansetron, Dexamethasone and Midazolam  Airway Management Planned: LMA  Additional Equipment:   Intra-op Plan:   Post-operative Plan: Extubation in OR  Informed Consent: I have reviewed the patients History and Physical, chart, labs and discussed the procedure including the  risks, benefits and alternatives for the proposed anesthesia with the patient or authorized representative who has indicated his/her understanding and acceptance.     Dental advisory given  Plan Discussed with:   Anesthesia Plan Comments: (Pt had L TKA 07/14/18. See PAT note 07/07/2018 by Karoline Caldwell, PA-C (cleared by cardiology prior to that surgery). Seen by cardiologist Dr. Rosalita Chessman 10/11/18, stable at that time, continue medical treatment and monitoring of aortic dissection. EKG done at that visit showed sinus bradycardia with occasional PVCs, Rate 59. Copy on pt chart. )     Anesthesia Quick Evaluation

## 2019-01-04 NOTE — Pre-Procedure Instructions (Signed)
Bill [name]  01/04/2019    Your procedure is scheduled on Wednesday, June 10.   Report to [facility], Main Entrance or Entrance "A" at 6:30 AM                Your surgery or procedure is scheduled for 8:30 AM   Call this number if you have problems the morning of surgery: [phone]  This is the number for the Pre- Surgical Desk.   Remember:  Do not eat after midnight.  You may drink clear liquids until 4:30 AM .  Clear liquids allowed are:   Water, Juice (non-citric and without pulp), Carbonated beverages, Clear Tea, Black Coffee only, Plain Jell-O only, Gatorade and Plain Popsicles only    Take these medicines the morning of surgery with A SIP OF WATER :  topiramate (TOPAMAX)  Aspirin follow surgeon's instructions.  Stop taking Coumadin, Plavix, Effient and Herbal medications.  Do not take any NSAIDs ie: Ibuprofen,  Advil,Naproxen,  or any medication containing Aspirin.diclofenac (VOLTAREN)   Special instructions:    [facility]- Preparing For Surgery  Before surgery, you can play an important role. Because skin is not sterile, your skin needs to be as free of germs as possible. You can reduce the number of germs on your skin by washing with CHG (chlorahexidine gluconate) Soap before surgery.  CHG is an antiseptic cleaner which kills germs and bonds with the skin to continue killing germs even after washing.    Oral Hygiene is also important to reduce your risk of infection.  Remember - BRUSH YOUR TEETH THE MORNING OF SURGERY WITH YOUR REGULAR TOOTHPASTE  Please do not use if you have an allergy to CHG or antibacterial soaps. If your skin becomes reddened/irritated stop using the CHG.  Do not shave (including legs and underarms) for at least 48 hours prior to first CHG shower. It is OK to shave your face.  Please follow these instructions carefully.   1. Shower the NIGHT BEFORE SURGERY and the MORNING OF SURGERY with CHG.   2. If you chose to wash your  hair, wash your hair first as usual with your normal shampoo.  3. After you shampoo, wash your face and private area with the soap you use at home, then rinse your hair and body thoroughly to remove the shampoo and soap.  4. Use CHG as you would any other liquid soap. You can apply CHG directly to the skin and wash gently with a scrungie or a clean washcloth.   5. Apply the CHG Soap to your body ONLY FROM THE NECK DOWN.  Do not use on open wounds or open sores. Avoid contact with your eyes, ears, mouth and genitals (private parts).  6. Wash thoroughly, paying special attention to the area where your surgery will be performed.  7. Thoroughly rinse your body with warm water from the neck down.  8. DO NOT shower/wash with your normal soap after using and rinsing off the CHG Soap.  9. Pat yourself dry with a CLEAN TOWEL.  10. Wear CLEAN PAJAMAS to bed the night before surgery, wear comfortable clothes the morning of surgery  11. Place CLEAN SHEETS on your bed the night of your first shower and DO NOT SLEEP WITH PETS.  Day of Surgery: shower as instructed above  Do not wear lotions, powders, or cologne, or deodorant. Please wear clean clothes to the hospital/surgery center.   Remember to brush your teeth WITH YOUR REGULAR  TOOTHPASTE.  Do not wear jewelry, make-up or nail polish.  Do not shave 48 hours prior to surgery.  Men may shave face and neck.  Do not bring valuables to the hospital.  [facility] not [facility] any belongings or valuables.  Contacts, dentures or bridgework may not be worn into surgery.  Leave your suitcase in the car.  After surgery it may be brought to your room.  For patients admitted to the hospital, discharge time will be determined by your treatment team.  Please read over the following fact sheets that you were given: Pain Booklet, Incentive Spirometry, Surgical Site Infections, 10 Things [facility]

## 2019-01-05 ENCOUNTER — Other Ambulatory Visit: Payer: Self-pay

## 2019-01-05 ENCOUNTER — Encounter (HOSPITAL_COMMUNITY)
Admission: RE | Disposition: A | Discharge status: Home / Self Care | Payer: Self-pay | Source: Home / Self Care | Attending: Orthopedic Surgery

## 2019-01-05 ENCOUNTER — Inpatient Hospital Stay (HOSPITAL_COMMUNITY): Payer: Managed Care, Other (non HMO) | Admitting: Anesthesiology

## 2019-01-05 ENCOUNTER — Inpatient Hospital Stay (HOSPITAL_COMMUNITY): Payer: Managed Care, Other (non HMO) | Admitting: Physician Assistant

## 2019-01-05 ENCOUNTER — Inpatient Hospital Stay (HOSPITAL_COMMUNITY)
Admission: RE | Admit: 2019-01-05 | Discharge: 2019-01-07 | DRG: 470 | Disposition: A | Discharge status: Home Health | Payer: Managed Care, Other (non HMO) | Attending: Orthopedic Surgery | Admitting: Orthopedic Surgery

## 2019-01-05 ENCOUNTER — Encounter (HOSPITAL_COMMUNITY): Payer: Self-pay

## 2019-01-05 DIAGNOSIS — Z96652 Presence of left artificial knee joint: Secondary | ICD-10-CM | POA: Diagnosis present

## 2019-01-05 DIAGNOSIS — Z96651 Presence of right artificial knee joint: Secondary | ICD-10-CM

## 2019-01-05 DIAGNOSIS — Z87891 Personal history of nicotine dependence: Secondary | ICD-10-CM

## 2019-01-05 DIAGNOSIS — I10 Essential (primary) hypertension: Secondary | ICD-10-CM | POA: Diagnosis present

## 2019-01-05 DIAGNOSIS — Z1159 Encounter for screening for other viral diseases: Secondary | ICD-10-CM

## 2019-01-05 DIAGNOSIS — Z96659 Presence of unspecified artificial knee joint: Secondary | ICD-10-CM

## 2019-01-05 DIAGNOSIS — M25561 Pain in right knee: Secondary | ICD-10-CM | POA: Diagnosis present

## 2019-01-05 DIAGNOSIS — Z6841 Body Mass Index (BMI) 40.0 and over, adult: Secondary | ICD-10-CM

## 2019-01-05 DIAGNOSIS — E785 Hyperlipidemia, unspecified: Secondary | ICD-10-CM | POA: Diagnosis present

## 2019-01-05 DIAGNOSIS — M1711 Unilateral primary osteoarthritis, right knee: Secondary | ICD-10-CM

## 2019-01-05 HISTORY — PX: TOTAL KNEE ARTHROPLASTY: SHX125

## 2019-01-05 SURGERY — ARTHROPLASTY, KNEE, TOTAL
Anesthesia: Regional | Site: Knee | Laterality: Right

## 2019-01-05 MED ORDER — OXYCODONE HCL 5 MG PO TABS
5.0000 mg | ORAL_TABLET | ORAL | Status: DC | PRN
  Administered 2019-01-06: 10 mg via ORAL
  Filled 2019-01-05: qty 2

## 2019-01-05 MED ORDER — METOCLOPRAMIDE HCL 5 MG/ML IJ SOLN: 5.0000 mg | Freq: Three times a day (TID) | INTRAMUSCULAR | Status: DC | PRN

## 2019-01-05 MED ORDER — OXYCODONE HCL 5 MG PO TABS
ORAL_TABLET | ORAL | Status: AC
  Administered 2019-01-06: 10 mg via ORAL
  Filled 2019-01-05: qty 3

## 2019-01-05 MED ORDER — 0.9 % SODIUM CHLORIDE (POUR BTL) OPTIME
TOPICAL | Status: DC | PRN
  Administered 2019-01-05: 1000 mL

## 2019-01-05 MED ORDER — HYDROCHLOROTHIAZIDE 25 MG PO TABS
25.0000 mg | ORAL_TABLET | Freq: Every day | ORAL | Status: DC
  Administered 2019-01-05 – 2019-01-07 (×3): 25 mg via ORAL
  Filled 2019-01-05 (×3): qty 1

## 2019-01-05 MED ORDER — HYDROMORPHONE HCL 1 MG/ML IJ SOLN
0.2500 mg | INTRAMUSCULAR | Status: DC | PRN
  Administered 2019-01-05 (×4): 0.5 mg via INTRAVENOUS

## 2019-01-05 MED ORDER — MEPERIDINE HCL 25 MG/ML IJ SOLN: 6.2500 mg | INTRAMUSCULAR | Status: DC | PRN

## 2019-01-05 MED ORDER — OXYCODONE HCL 5 MG PO TABS
10.0000 mg | ORAL_TABLET | ORAL | Status: DC | PRN
  Administered 2019-01-05 – 2019-01-07 (×5): 15 mg via ORAL
  Filled 2019-01-05 (×4): qty 3

## 2019-01-05 MED ORDER — HYDROMORPHONE HCL 1 MG/ML IJ SOLN
INTRAMUSCULAR | Status: AC
  Filled 2019-01-05: qty 2

## 2019-01-05 MED ORDER — CLONIDINE HCL (ANALGESIA) 100 MCG/ML EP SOLN
EPIDURAL | Status: DC | PRN
  Administered 2019-01-05: 100 ug

## 2019-01-05 MED ORDER — MAGNESIUM CITRATE PO SOLN: 1.0000 | Freq: Once | ORAL | Status: DC | PRN

## 2019-01-05 MED ORDER — ASPIRIN EC 325 MG PO TBEC
325.0000 mg | DELAYED_RELEASE_TABLET | Freq: Every day | ORAL | Status: DC
  Administered 2019-01-06 – 2019-01-07 (×2): 325 mg via ORAL
  Filled 2019-01-05 (×2): qty 1

## 2019-01-05 MED ORDER — SCOPOLAMINE 1 MG/3DAYS TD PT72
MEDICATED_PATCH | TRANSDERMAL | Status: AC
  Filled 2019-01-05: qty 1

## 2019-01-05 MED ORDER — ONDANSETRON HCL 4 MG PO TABS: 4.0000 mg | ORAL_TABLET | Freq: Four times a day (QID) | ORAL | Status: DC | PRN

## 2019-01-05 MED ORDER — METHOCARBAMOL 500 MG PO TABS
ORAL_TABLET | ORAL | Status: AC
  Administered 2019-01-06: 500 mg via ORAL
  Filled 2019-01-05: qty 1

## 2019-01-05 MED ORDER — BISACODYL 10 MG RE SUPP: 10.0000 mg | Freq: Every day | RECTAL | Status: DC | PRN

## 2019-01-05 MED ORDER — ONDANSETRON HCL 4 MG/2ML IJ SOLN: 4.0000 mg | Freq: Four times a day (QID) | INTRAMUSCULAR | Status: DC | PRN

## 2019-01-05 MED ORDER — ROPIVACAINE HCL 7.5 MG/ML IJ SOLN
INTRAMUSCULAR | Status: DC | PRN
  Administered 2019-01-05 (×4): 5 mL via PERINEURAL

## 2019-01-05 MED ORDER — CEFAZOLIN SODIUM-DEXTROSE 2-4 GM/100ML-% IV SOLN
2.0000 g | Freq: Four times a day (QID) | INTRAVENOUS | Status: AC
  Administered 2019-01-05 (×2): 2 g via INTRAVENOUS
  Filled 2019-01-05 (×2): qty 100

## 2019-01-05 MED ORDER — DEXAMETHASONE SODIUM PHOSPHATE 10 MG/ML IJ SOLN
INTRAMUSCULAR | Status: DC | PRN
  Administered 2019-01-05: 8 mg via INTRAVENOUS

## 2019-01-05 MED ORDER — PROPOFOL 10 MG/ML IV BOLUS
INTRAVENOUS | Status: AC
  Filled 2019-01-05: qty 20

## 2019-01-05 MED ORDER — BISOPROLOL FUMARATE 10 MG PO TABS
10.0000 mg | ORAL_TABLET | Freq: Every day | ORAL | Status: DC
  Administered 2019-01-05 – 2019-01-06 (×2): 10 mg via ORAL
  Filled 2019-01-05 (×2): qty 1

## 2019-01-05 MED ORDER — KETOROLAC TROMETHAMINE 30 MG/ML IJ SOLN
INTRAMUSCULAR | Status: AC
  Filled 2019-01-05: qty 1

## 2019-01-05 MED ORDER — KETAMINE HCL 10 MG/ML IJ SOLN
INTRAMUSCULAR | Status: DC | PRN
  Administered 2019-01-05: 20 mg via INTRAVENOUS
  Administered 2019-01-05: 10 mg via INTRAVENOUS

## 2019-01-05 MED ORDER — ONDANSETRON HCL 4 MG/2ML IJ SOLN
INTRAMUSCULAR | Status: AC
  Filled 2019-01-05: qty 2

## 2019-01-05 MED ORDER — KETOROLAC TROMETHAMINE 30 MG/ML IJ SOLN
30.0000 mg | Freq: Once | INTRAMUSCULAR | Status: AC | PRN
  Administered 2019-01-05: 30 mg via INTRAVENOUS

## 2019-01-05 MED ORDER — SODIUM CHLORIDE 0.9 % IV SOLN
INTRAVENOUS | Status: DC
  Administered 2019-01-05: 11:00:00 via INTRAVENOUS

## 2019-01-05 MED ORDER — ONDANSETRON HCL 4 MG/2ML IJ SOLN
INTRAMUSCULAR | Status: DC | PRN
  Administered 2019-01-05: 4 mg via INTRAVENOUS

## 2019-01-05 MED ORDER — METHOCARBAMOL 500 MG PO TABS
500.0000 mg | ORAL_TABLET | Freq: Four times a day (QID) | ORAL | Status: DC | PRN
  Administered 2019-01-05 – 2019-01-06 (×3): 500 mg via ORAL
  Filled 2019-01-05 (×3): qty 1

## 2019-01-05 MED ORDER — METOCLOPRAMIDE HCL 5 MG PO TABS: 5.0000 mg | ORAL_TABLET | Freq: Three times a day (TID) | ORAL | Status: DC | PRN

## 2019-01-05 MED ORDER — LOSARTAN POTASSIUM 50 MG PO TABS
100.0000 mg | ORAL_TABLET | Freq: Every day | ORAL | Status: DC
  Administered 2019-01-05 – 2019-01-07 (×3): 100 mg via ORAL
  Filled 2019-01-05 (×3): qty 2

## 2019-01-05 MED ORDER — LIDOCAINE 2% (20 MG/ML) 5 ML SYRINGE
INTRAMUSCULAR | Status: AC
  Filled 2019-01-05: qty 5

## 2019-01-05 MED ORDER — ACETAMINOPHEN 325 MG PO TABS: 325.0000 mg | ORAL_TABLET | Freq: Four times a day (QID) | ORAL | Status: DC | PRN

## 2019-01-05 MED ORDER — PHENOL 1.4 % MT LIQD: 1.0000 | OROMUCOSAL | Status: DC | PRN

## 2019-01-05 MED ORDER — TOPIRAMATE 25 MG PO TABS
50.0000 mg | ORAL_TABLET | Freq: Two times a day (BID) | ORAL | Status: DC
  Administered 2019-01-05 – 2019-01-07 (×5): 50 mg via ORAL
  Filled 2019-01-05 (×5): qty 2

## 2019-01-05 MED ORDER — KETAMINE HCL 50 MG/5ML IJ SOSY
PREFILLED_SYRINGE | INTRAMUSCULAR | Status: AC
  Filled 2019-01-05: qty 5

## 2019-01-05 MED ORDER — PROMETHAZINE HCL 25 MG/ML IJ SOLN: 6.2500 mg | INTRAMUSCULAR | Status: DC | PRN

## 2019-01-05 MED ORDER — MIDAZOLAM HCL 2 MG/2ML IJ SOLN
INTRAMUSCULAR | Status: AC
  Filled 2019-01-05: qty 2

## 2019-01-05 MED ORDER — DEXAMETHASONE SODIUM PHOSPHATE 10 MG/ML IJ SOLN
INTRAMUSCULAR | Status: AC
  Filled 2019-01-05: qty 1

## 2019-01-05 MED ORDER — MIDAZOLAM HCL 5 MG/5ML IJ SOLN
INTRAMUSCULAR | Status: DC | PRN
  Administered 2019-01-05: 2 mg via INTRAVENOUS

## 2019-01-05 MED ORDER — LIDOCAINE 2% (20 MG/ML) 5 ML SYRINGE
INTRAMUSCULAR | Status: DC | PRN
  Administered 2019-01-05: 100 mg via INTRAVENOUS

## 2019-01-05 MED ORDER — FENTANYL CITRATE (PF) 250 MCG/5ML IJ SOLN
INTRAMUSCULAR | Status: AC
  Filled 2019-01-05: qty 5

## 2019-01-05 MED ORDER — HYDROMORPHONE HCL 1 MG/ML IJ SOLN
0.5000 mg | INTRAMUSCULAR | Status: DC | PRN
  Administered 2019-01-05 – 2019-01-07 (×3): 1 mg via INTRAVENOUS
  Filled 2019-01-05 (×3): qty 1

## 2019-01-05 MED ORDER — CHLORHEXIDINE GLUCONATE 4 % EX LIQD: 60.0000 mL | Freq: Once | CUTANEOUS | Status: DC

## 2019-01-05 MED ORDER — LACTATED RINGERS IV SOLN
INTRAVENOUS | Status: DC | PRN
  Administered 2019-01-05: 08:00:00 via INTRAVENOUS

## 2019-01-05 MED ORDER — MENTHOL 3 MG MT LOZG: 1.0000 | LOZENGE | OROMUCOSAL | Status: DC | PRN

## 2019-01-05 MED ORDER — POLYETHYLENE GLYCOL 3350 17 G PO PACK: 17.0000 g | PACK | Freq: Every day | ORAL | Status: DC | PRN

## 2019-01-05 MED ORDER — ROPIVACAINE HCL 5 MG/ML IJ SOLN
INTRAMUSCULAR | Status: DC | PRN
  Administered 2019-01-05 (×2): 5 mL via PERINEURAL

## 2019-01-05 MED ORDER — PRAVASTATIN SODIUM 10 MG PO TABS
20.0000 mg | ORAL_TABLET | Freq: Every day | ORAL | Status: DC
  Administered 2019-01-05 – 2019-01-06 (×2): 20 mg via ORAL
  Filled 2019-01-05 (×2): qty 2

## 2019-01-05 MED ORDER — FENTANYL CITRATE (PF) 100 MCG/2ML IJ SOLN
INTRAMUSCULAR | Status: DC | PRN
  Administered 2019-01-05 (×3): 25 ug via INTRAVENOUS
  Administered 2019-01-05 (×2): 50 ug via INTRAVENOUS
  Administered 2019-01-05: 25 ug via INTRAVENOUS

## 2019-01-05 MED ORDER — SCOPOLAMINE 1 MG/3DAYS TD PT72
MEDICATED_PATCH | TRANSDERMAL | Status: DC | PRN
  Administered 2019-01-05: 1 via TRANSDERMAL

## 2019-01-05 MED ORDER — PROPOFOL 10 MG/ML IV BOLUS
INTRAVENOUS | Status: DC | PRN
  Administered 2019-01-05: 200 mg via INTRAVENOUS

## 2019-01-05 MED ORDER — SODIUM CHLORIDE 0.9 % IR SOLN
Status: DC | PRN
  Administered 2019-01-05: 3000 mL

## 2019-01-05 MED ORDER — METHOCARBAMOL 1000 MG/10ML IJ SOLN: 500.0000 mg | Freq: Four times a day (QID) | INTRAVENOUS | Status: DC | PRN

## 2019-01-05 MED ORDER — DOCUSATE SODIUM 100 MG PO CAPS
100.0000 mg | ORAL_CAPSULE | Freq: Two times a day (BID) | ORAL | Status: DC
  Administered 2019-01-05 – 2019-01-07 (×5): 100 mg via ORAL
  Filled 2019-01-05 (×5): qty 1

## 2019-01-05 SURGICAL SUPPLY — 56 items
ATTUNE MED DOME PAT 41 KNEE (Knees) ×2 IMPLANT
ATTUNE MED DOME PAT 41MM KNEE (Knees) ×1 IMPLANT
ATTUNE PS FEM RT SZ9 CEM KNEE (Femur) ×3 IMPLANT
BANDAGE ACE 3X5.8 VEL STRL LF (GAUZE/BANDAGES/DRESSINGS) ×3 IMPLANT
BASEPLATE TIB CMT FB PCKT SZ 9 (Knees) ×3 IMPLANT
BLADE SAGITTAL 25.0X1.19X90 (BLADE) ×2 IMPLANT
BLADE SAGITTAL 25.0X1.19X90MM (BLADE) ×1
BLADE SAW SGTL 13X75X1.27 (BLADE) ×3 IMPLANT
BLADE SURG 21 STRL SS (BLADE) ×6 IMPLANT
BNDG COHESIVE 4X5 TAN STRL (GAUZE/BANDAGES/DRESSINGS) ×3 IMPLANT
BNDG COHESIVE 6X5 TAN STRL LF (GAUZE/BANDAGES/DRESSINGS) ×3 IMPLANT
BNDG GAUZE ELAST 4 BULKY (GAUZE/BANDAGES/DRESSINGS) ×3 IMPLANT
BOWL SMART MIX CTS (DISPOSABLE) ×3 IMPLANT
CANISTER WOUNDNEG PRESSURE 500 (CANNISTER) ×3 IMPLANT
CEMENT BONE REFOBACIN R1X40 US (Cement) ×6 IMPLANT
COVER SURGICAL LIGHT HANDLE (MISCELLANEOUS) ×3 IMPLANT
COVER WAND RF STERILE (DRAPES) ×3 IMPLANT
DRAPE EXTREMITY T 121X128X90 (DISPOSABLE) ×3 IMPLANT
DRAPE HALF SHEET 40X57 (DRAPES) ×6 IMPLANT
DRAPE INCISE IOBAN 66X45 STRL (DRAPES) ×3 IMPLANT
DRAPE U-SHAPE 47X51 STRL (DRAPES) ×3 IMPLANT
DRESSING PREVENA PLUS CUSTOM (GAUZE/BANDAGES/DRESSINGS) ×1 IMPLANT
DRSG ADAPTIC 3X8 NADH LF (GAUZE/BANDAGES/DRESSINGS) ×3 IMPLANT
DRSG PAD ABDOMINAL 8X10 ST (GAUZE/BANDAGES/DRESSINGS) ×3 IMPLANT
DRSG PREVENA PLUS CUSTOM (GAUZE/BANDAGES/DRESSINGS) ×3
DURAPREP 26ML APPLICATOR (WOUND CARE) ×3 IMPLANT
ELECT REM PT RETURN 9FT ADLT (ELECTROSURGICAL) ×3
ELECTRODE REM PT RTRN 9FT ADLT (ELECTROSURGICAL) ×1 IMPLANT
FACESHIELD WRAPAROUND (MASK) ×3 IMPLANT
GAUZE SPONGE 4X4 12PLY STRL (GAUZE/BANDAGES/DRESSINGS) ×3 IMPLANT
GLOVE BIOGEL PI IND STRL 9 (GLOVE) ×1 IMPLANT
GLOVE BIOGEL PI INDICATOR 9 (GLOVE) ×2
GLOVE SURG ORTHO 9.0 STRL STRW (GLOVE) ×9 IMPLANT
GOWN STRL REUS W/ TWL XL LVL3 (GOWN DISPOSABLE) ×2 IMPLANT
GOWN STRL REUS W/TWL XL LVL3 (GOWN DISPOSABLE) ×4
HANDPIECE INTERPULSE COAX TIP (DISPOSABLE) ×2
INSERT TIB KNEE ATTUNE POST 9 (Knees) ×3 IMPLANT
KIT BASIN OR (CUSTOM PROCEDURE TRAY) ×3 IMPLANT
KIT TURNOVER KIT B (KITS) ×3 IMPLANT
MANIFOLD NEPTUNE II (INSTRUMENTS) ×3 IMPLANT
NS IRRIG 1000ML POUR BTL (IV SOLUTION) ×3 IMPLANT
PACK TOTAL JOINT (CUSTOM PROCEDURE TRAY) ×3 IMPLANT
PAD ARMBOARD 7.5X6 YLW CONV (MISCELLANEOUS) ×3 IMPLANT
PREVENA RESTOR ARTHOFORM 46X30 (CANNISTER) ×3 IMPLANT
SET HNDPC FAN SPRY TIP SCT (DISPOSABLE) ×1 IMPLANT
SPONGE LAP 18X18 RF (DISPOSABLE) ×3 IMPLANT
STAPLER VISISTAT 35W (STAPLE) ×3 IMPLANT
SUCTION FRAZIER HANDLE 10FR (MISCELLANEOUS)
SUCTION TUBE FRAZIER 10FR DISP (MISCELLANEOUS) IMPLANT
SUT VIC AB 0 CT1 27 (SUTURE) ×4
SUT VIC AB 0 CT1 27XBRD ANBCTR (SUTURE) ×2 IMPLANT
SUT VIC AB 1 CTX 36 (SUTURE)
SUT VIC AB 1 CTX36XBRD ANBCTR (SUTURE) IMPLANT
TOWEL OR 17X24 6PK STRL BLUE (TOWEL DISPOSABLE) ×3 IMPLANT
TOWEL OR 17X26 10 PK STRL BLUE (TOWEL DISPOSABLE) ×3 IMPLANT
WRAP KNEE MAXI GEL POST OP (GAUZE/BANDAGES/DRESSINGS) ×3 IMPLANT

## 2019-01-05 NOTE — Anesthesia Procedure Notes (Signed)
Procedure Name: LMA Insertion Performed by: Lekeshia Kram, Orion H, CRNA Pre-anesthesia Checklist: Patient identified, Emergency Drugs available, Suction available and Patient being monitored Patient Re-evaluated:Patient Re-evaluated prior to induction Oxygen Delivery Method: Circle System Utilized Preoxygenation: Pre-oxygenation with 100% oxygen Induction Type: IV induction LMA: LMA inserted LMA Size: 5.0 Number of attempts: 1 Airway Equipment and Method: Bite block Placement Confirmation: positive ETCO2 Tube secured with: Tape Dental Injury: Teeth and Oropharynx as per pre-operative assessment        

## 2019-01-05 NOTE — Op Note (Signed)
DATE OF SURGERY:  01/05/2019  TIME: 10:25 AM  PATIENT NAME:  Bill [name]    AGE: 53 y.o.    PRE-OPERATIVE DIAGNOSIS:  Osteoarthritis Right Knee  POST-OPERATIVE DIAGNOSIS:  Osteoarthritis Right Knee  PROCEDURE:  Procedure(s): RIGHT TOTAL KNEE ARTHROPLASTY  SURGEON: Meridee Score  ASSISTANT: April Green  OPERATIVE IMPLANTS: Depuy , Posterior Stabilized.  Femur size 9, Tibia size 9, Patella size 41 3-peg oval button, with a 5 mm polyethylene insert.  @ENCIMAGES @       PREOPERATIVE INDICATIONS:   Xsavier [name] is a 53 y.o. year old male with end stage degenerative arthritis of the knee who failed conservative treatment and elected for Total Knee Arthroplasty.   The risks, benefits, and alternatives were discussed at length including but not limited to the risks of infection, bleeding, nerve injury, stiffness, blood clots, the need for revision surgery, cardiopulmonary complications, among others, and they were willing to proceed.  OPERATIVE DESCRIPTION:  The patient was brought to the operative room and placed in a supine position.  General anesthesia was administered.  IV antibiotics were given.  The lower extremity was prepped and draped in the usual sterile fashion.  Charlie Pitter was used to cover all exposed skin. Time out was performed.    Anterior quadriceps tendon splitting approach was performed.  The patella was everted and osteophytes were removed.  The anterior horn of the medial and lateral meniscus was removed.   The distal femur was opened with the drill and the intramedullary distal femoral cutting jig was utilized, set at 5 degrees valgus resecting 9 mm off the distal femur.  Care was taken to protect the collateral ligaments.  Then the extramedullary tibial cutting jig was utilized set for 3 degree posterior slope.  Care was taken during the cut to protect the medial and collateral ligaments.  The proximal tibia was removed along with the posterior  horns of the menisci.  The PCL was sacrificed.    The extensor gap was measured and was approximately 5 mm.    The distal femoral sizing jig was applied, taking care to avoid notching.  Then the 4-in-1 cutting jig was applied and the anterior and posterior femur was cut, along with the chamfer cuts.  All posterior osteophytes were removed.  The flexion gap was then measured and was symmetric with the extension gap.  The distal femoral preparation using the appropriate jig to prepare the box.  The patella was then measured, and cut with the saw.    The proximal tibia sized and prepared accordingly with the reamer and the punch, and then all components were trialed with the poly insert.  The knee was found to have stable balance and full motion.  The knee was irrigated with normal saline and the knee was soaked with TXA.  The above named components were then cemented into place and all excess cement was removed.  The final polyethylene component was in place during cementation.  The knee was kept in extension until the cement hardened.  The knee was then taken through a range of motion and the patella tracked well and the knee irrigated copiously and the parapatellar and subcutaneous tissue closed with vicryl, and skin closed with staples..  A sterile VAC was applied and patient  was taken to the PACU in stable  condition.  There were no complications.  Total tourniquet time was 0 minutes.

## 2019-01-05 NOTE — Anesthesia Postprocedure Evaluation (Signed)
Anesthesia Post Note  Patient: Bill [name]  Procedure(s) Performed: RIGHT TOTAL KNEE ARTHROPLASTY (Right Knee)     Patient location during evaluation: PACU Anesthesia Type: General Level of consciousness: awake Pain management: pain level controlled Vital Signs Assessment: post-procedure vital signs reviewed and stable Respiratory status: spontaneous breathing Cardiovascular status: stable Postop Assessment: no apparent nausea or vomiting Anesthetic complications: no    Last Vitals:  Vitals:   01/05/19 1205 01/05/19 1220  BP: 112/66 98/71  Pulse: (!) 51 (!) 50  Resp: 12 14  Temp:    SpO2: 96% 99%    Last Pain:  Vitals:   01/05/19 1051  TempSrc:   PainSc: 9    Pain Goal:        RLE Motor Response: Purposeful movement (01/05/19 1230) RLE Sensation: Full sensation, No numbness, No tingling (01/05/19 1230)        Huston Foley

## 2019-01-05 NOTE — Transfer of Care (Signed)
Immediate Anesthesia Transfer of Care Note  Patient: Bill [name]  Procedure(s) Performed: RIGHT TOTAL KNEE ARTHROPLASTY (Right Knee)  Patient Location: PACU  Anesthesia Type:General  Level of Consciousness: drowsy  Airway & Oxygen Therapy: Patient Spontanous Breathing and Patient connected to face mask oxygen  Post-op Assessment: Report given to RN and Post -op Vital signs reviewed and stable  Post vital signs: Reviewed and stable  Last Vitals:  Vitals Value Taken Time  BP 114/72 01/05/2019 10:51 AM  Temp    Pulse 60 01/05/2019 10:52 AM  Resp 16 01/05/2019 10:52 AM  SpO2 98 % 01/05/2019 10:52 AM  Vitals shown include unvalidated device data.  Last Pain:  Vitals:   01/05/19 0704  TempSrc:   PainSc: 0-No pain         Complications: No apparent anesthesia complications

## 2019-01-05 NOTE — Discharge Instructions (Signed)
INSTRUCTIONS AFTER JOINT REPLACEMENT   o Remove items at home which could result in a fall. This includes throw rugs or furniture in walking pathways o ICE to the affected joint every three hours while awake for 30 minutes at a time, for at least the first 3-5 days, and then as needed for pain and swelling.  Continue to use ice for pain and swelling. You may notice swelling that will progress down to the foot and ankle.  This is normal after surgery.  Elevate your leg when you are not up walking on it.   o Continue to use the breathing machine you got in the hospital (incentive spirometer) which will help keep your temperature down.  It is common for your temperature to cycle up and down following surgery, especially at night when you are not up moving around and exerting yourself.  The breathing machine keeps your lungs expanded and your temperature down.   DIET:  As you were doing prior to hospitalization, we recommend a well-balanced diet.  DRESSING / WOUND CARE / SHOWERING  You have a wound VAC dressing over the surgical incision.  This will remain in place for 1 week after discharge.  Please plug in the wound VAC pump to maintain its charge   ACTIVITY  o Increase activity slowly as tolerated, but follow the weight bearing instructions below.   o No driving for 6 weeks or until further direction given by your physician.  You cannot drive while taking narcotics.  o No lifting or carrying greater than 10 lbs. until further directed by your surgeon. o Avoid periods of inactivity such as sitting longer than an hour when not asleep. This helps prevent blood clots.  o You may return to work once you are authorized by your doctor.     WEIGHT BEARING   Weight bearing as tolerated with assist device (walker, cane, etc) as directed, use it as long as suggested by your surgeon or therapist, typically at least 4-6 weeks.   EXERCISES  Results after joint replacement surgery are often greatly  improved when you follow the exercise, range of motion and muscle strengthening exercises prescribed by your doctor. Safety measures are also important to protect the joint from further injury. Any time any of these exercises cause you to have increased pain or swelling, decrease what you are doing until you are comfortable again and then slowly increase them. If you have problems or questions, call your caregiver or physical therapist for advice.   Rehabilitation is important following a joint replacement. After just a few days of immobilization, the muscles of the leg can become weakened and shrink (atrophy).  These exercises are designed to build up the tone and strength of the thigh and leg muscles and to improve motion. Often times heat used for twenty to thirty minutes before working out will loosen up your tissues and help with improving the range of motion but do not use heat for the first two weeks following surgery (sometimes heat can increase post-operative swelling).   These exercises can be done on a training (exercise) mat, on the floor, on a table or on a bed. Use whatever works the best and is most comfortable for you.    Use music or television while you are exercising so that the exercises are a pleasant break in your day. This will make your life better with the exercises acting as a break in your routine that you can look forward to.   Perform  all exercises about fifteen times, three times per day or as directed.  You should exercise both the operative leg and the other leg as well.  Exercises include:    Quad Sets - Tighten up the muscle on the front of the thigh (Quad) and hold for 5-10 seconds.    Straight Leg Raises - With your knee straight (if you were given a brace, keep it on), lift the leg to 60 degrees, hold for 3 seconds, and slowly lower the leg.  Perform this exercise against resistance later as your leg gets stronger.   Leg Slides: Lying on your back, slowly slide your  foot toward your buttocks, bending your knee up off the floor (only go as far as is comfortable). Then slowly slide your foot back down until your leg is flat on the floor again.   Angel Wings: Lying on your back spread your legs to the side as far apart as you can without causing discomfort.   Hamstring Strength:  Lying on your back, push your heel against the floor with your leg straight by tightening up the muscles of your buttocks.  Repeat, but this time bend your knee to a comfortable angle, and push your heel against the floor.  You may put a pillow under the heel to make it more comfortable if necessary.   A rehabilitation program following joint replacement surgery can speed recovery and prevent re-injury in the future due to weakened muscles. Contact your doctor or a physical therapist for more information on knee rehabilitation.    CONSTIPATION  Constipation is defined medically as fewer than three stools per week and severe constipation as less than one stool per week.  Even if you have a regular bowel pattern at home, your normal regimen is likely to be disrupted due to multiple reasons following surgery.  Combination of anesthesia, postoperative narcotics, change in appetite and fluid intake all can affect your bowels.   YOU MUST use at least one of the following options; they are listed in order of increasing strength to get the job done.  They are all available over the counter, and you may need to use some, POSSIBLY even all of these options:    Drink plenty of fluids (prune juice may be helpful) and high fiber foods Colace 100 mg by mouth twice a day  Senokot for constipation as directed and as needed Dulcolax (bisacodyl), take with full glass of water  Miralax (polyethylene glycol) once or twice a day as needed.  If you have tried all these things and are unable to have a bowel movement in the first 3-4 days after surgery call either your surgeon or your primary doctor.    If  you experience loose stools or diarrhea, hold the medications until you stool forms back up.  If your symptoms do not get better within 1 week or if they get worse, check with your doctor.  If you experience "the worst abdominal pain ever" or develop nausea or vomiting, please contact the office immediately for further recommendations for treatment.   ITCHING:  If you experience itching with your medications, try taking only a single pain pill, or even half a pain pill at a time.  You can also use Benadryl over the counter for itching or also to help with sleep.   TED HOSE STOCKINGS:  Use stockings on both legs until for at least 2 weeks or as directed by physician office. They may be removed at night for sleeping.  MEDICATIONS:  See your medication summary on the After Visit Summary that nursing will review with you.  You may have some home medications which will be placed on hold until you complete the course of blood thinner medication.  It is important for you to complete the blood thinner medication as prescribed.  PRECAUTIONS:  If you experience chest pain or shortness of breath - call 911 immediately for transfer to the hospital emergency department.   If you develop a fever greater that 101 F, purulent drainage from wound, increased redness or drainage from wound, foul odor from the wound/dressing, or calf pain - CONTACT YOUR SURGEON.                                                   FOLLOW-UP APPOINTMENTS:  If you do not already have a post-op appointment, please call the office for an appointment to be seen by your surgeon.  Guidelines for how soon to be seen are listed in your After Visit Summary, but are typically between 1-4 weeks after surgery.  OTHER INSTRUCTIONS:   Knee Replacement:  Do not place pillow under knee, focus on keeping the knee straight while resting. CPM instructions: 0-90 degrees, 2 hours in the morning, 2 hours in the afternoon, and 2 hours in the evening. Place  foam block, curve side up under heel at all times except when in CPM or when walking.  DO NOT modify, tear, cut, or change the foam block in any way.  MAKE SURE YOU:   Understand these instructions.   Get help right away if you are not doing well or get worse.    Thank you for letting us be a part of your medical care team.  It is a privilege we respect greatly.  We hope these instructions will help you stay on track for a fast and full recovery!

## 2019-01-05 NOTE — Anesthesia Procedure Notes (Signed)
Anesthesia Regional Block: Adductor canal block   Pre-Anesthetic Checklist: ,, timeout performed, Correct Patient, Correct Site, Correct Laterality, Correct Procedure, Correct Position, site marked, Risks and benefits discussed,  Surgical consent,  Pre-op evaluation,  At surgeon's request and post-op pain management  Laterality: Lower and Right  Prep: chloraprep       Needles:  Injection technique: Single-shot  Needle Type: Echogenic Stimulator Needle     Needle Length: 10cm  Needle Gauge: 21   Needle insertion depth: 5 cm   Additional Needles:   Procedures:,,,, ultrasound used (permanent image in chart),,,,  Narrative:  Start time: 01/05/2019 8:10 AM End time: 01/05/2019 8:27 AM Injection made incrementally with aspirations every 5 mL. Anesthesiologist: Lyn Hollingshead, MD
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
## 2019-01-05 NOTE — Evaluation (Signed)
Physical Therapy Evaluation Patient Details Name: Bill [name] MRN: [id number] DOB: May 23, 1966 Today's Date: 01/05/2019   History of Present Illness  Pt is a 53 y/o male s/p R TKA. PMH includes HTN, sleep apnea, and L TKA.   Clinical Impression  Pt is s/p surgery above with deficits below. Pt requiring min to min guard A for short distance ambulation using RW. Distance limited secondary to pain. Will continue to follow acutely to maximize functional mobility independence and safety.     Follow Up Recommendations Follow surgeon's recommendation for DC plan and follow-up therapies;Supervision for mobility/OOB    Equipment Recommendations  None recommended by PT    Recommendations for Other Services OT consult     Precautions / Restrictions Precautions Precautions: Knee Precaution Booklet Issued: No Precaution Comments: Reviewed knee precautions with pt.  Restrictions Weight Bearing Restrictions: Yes RLE Weight Bearing: Weight bearing as tolerated      Mobility  Bed Mobility Overal bed mobility: Needs Assistance Bed Mobility: Supine to Sit;Sit to Supine     Supine to sit: Supervision;HOB elevated Sit to supine: Min assist   General bed mobility comments: Supervision to come to sitting. Min A for RLE assist for return to supine.   Transfers Overall transfer level: Needs assistance Equipment used: Rolling walker (2 wheeled) Transfers: Sit to/from Stand Sit to Stand: Min guard;From elevated surface         General transfer comment: Min guard for safety from elevated surface.   Ambulation/Gait Ambulation/Gait assistance: Min guard;Min assist Gait Distance (Feet): 15 Feet Assistive device: Rolling walker (2 wheeled) Gait Pattern/deviations: Step-to pattern;Decreased step length - right;Decreased step length - left;Decreased weight shift to right;Antalgic;Trunk flexed Gait velocity: Decreased    General Gait Details: Slow, antalgic gait. Distance limited to  bathroom and back secondary to pain. Cues for sequencing using RW and for upright posture.   Stairs            Wheelchair Mobility    Modified Rankin (Stroke Patients Only)       Balance Overall balance assessment: Needs assistance Sitting-balance support: No upper extremity supported;Feet supported Sitting balance-Leahy Scale: Good     Standing balance support: Bilateral upper extremity supported;During functional activity Standing balance-Leahy Scale: Poor Standing balance comment: Reliant on BUE support                              Pertinent Vitals/Pain Pain Assessment: Faces Faces Pain Scale: Hurts even more Pain Location: R knee  Pain Descriptors / Indicators: Aching;Operative site guarding Pain Intervention(s): Limited activity within patient's tolerance;Monitored during session;Repositioned    Home Living Family/patient expects to be discharged to:: Private residence Living Arrangements: Spouse/significant other;Children Available Help at Discharge: Family;Available 24 hours/day Type of Home: House Home Access: Stairs to enter Entrance Stairs-Rails: Right;Left;Can reach both Entrance Stairs-Number of Steps: 3 Home Layout: One level Home Equipment: Walker - 2 wheels;Bedside commode;Shower seat;Grab bars - tub/shower;Cane - single point      Prior Function Level of Independence: Independent with assistive device(s)         Comments: Reports use of cane for longer distances.      Hand Dominance        Extremity/Trunk Assessment   Upper Extremity Assessment Upper Extremity Assessment: Defer to OT evaluation    Lower Extremity Assessment Lower Extremity Assessment: RLE deficits/detail RLE Deficits / Details: Deficits consistent with post op pain and weakness.     Cervical / Trunk  Assessment Cervical / Trunk Assessment: Normal  Communication   Communication: No difficulties  Cognition Arousal/Alertness: Awake/alert Behavior  During Therapy: WFL for tasks assessed/performed Overall Cognitive Status: Within Functional Limits for tasks assessed                                        General Comments      Exercises Total Joint Exercises Ankle Circles/Pumps: AROM;Both;10 reps;Supine   Assessment/Plan    PT Assessment Patient needs continued PT services  PT Problem List Decreased strength;Decreased activity tolerance;Decreased balance;Decreased range of motion;Decreased mobility;Decreased knowledge of use of DME;Decreased knowledge of precautions;Pain       PT Treatment Interventions DME instruction;Gait training;Functional mobility training;Stair training;Therapeutic activities;Therapeutic exercise;Balance training;Patient/family education    PT Goals (Current goals can be found in the Care Plan section)  Acute Rehab PT Goals Patient Stated Goal: to be able to walk better PT Goal Formulation: With patient Time For Goal Achievement: 01/19/19 Potential to Achieve Goals: Good    Frequency 7X/week   Barriers to discharge        Co-evaluation               AM-PAC PT "6 Clicks" Mobility  Outcome Measure Help needed turning from your back to your side while in a flat bed without using bedrails?: A Little Help needed moving from lying on your back to sitting on the side of a flat bed without using bedrails?: A Little Help needed moving to and from a bed to a chair (including a wheelchair)?: A Little Help needed standing up from a chair using your arms (e.g., wheelchair or bedside chair)?: A Little Help needed to walk in hospital room?: A Little Help needed climbing 3-5 steps with a railing? : A Lot 6 Click Score: 17    End of Session   Activity Tolerance: Patient limited by pain Patient left: in bed;with call bell/phone within reach;with nursing/sitter in room Nurse Communication: Mobility status;Other (comment)(pt voided) PT Visit Diagnosis: Other abnormalities of gait and  mobility (R26.89);Unsteadiness on feet (R26.81);Pain Pain - Right/Left: Right Pain - part of body: Knee    Time: 1653-1730 PT Time Calculation (min) (ACUTE ONLY): 37 min   Charges:   PT Evaluation $PT Eval Low Complexity: 1 Low PT Treatments $Therapeutic Activity: 8-22 mins        Gladys DammeBrittany Levent Kornegay, PT, DPT  Acute Rehabilitation Services  Pager: [phone] Office: [phone]   Lehman PromBrittany S Ezzard Ditmer 01/05/2019, 6:30 PM
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
## 2019-01-06 ENCOUNTER — Encounter (HOSPITAL_COMMUNITY): Payer: Self-pay | Admitting: Orthopedic Surgery
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
## 2019-01-06 NOTE — Evaluation (Signed)
Occupational Therapy Evaluation Patient Details Name: Bill MiyamotoJames William [name] MRN: [id number] DOB: [birth year] Today's Date: 01/06/2019    History of Present Illness Pt is a 53 y/o male s/p R TKA. PMH includes HTN, sleep apnea, and L TKA.    Clinical Impression   Pt admitted with the above diagnoses and presents with below problem list. Pt will benefit from continued acute OT to address the below listed deficits and maximize independence with basic ADLs prior to d/c home. PTA pt reports he was mod I with ADLs. Pt is currently min guard to min A with LB ADLs and functional transfers/mobility. Reviewed ADL education.     Follow Up Recommendations  No OT follow up;Supervision - Intermittent;Other (comment)(OOB/mobility)    Equipment Recommendations  None recommended by OT    Recommendations for Other Services       Precautions / Restrictions Precautions Precautions: Knee Precaution Booklet Issued: No Precaution Comments: Reviewed knee precautions with pt.  Restrictions Weight Bearing Restrictions: Yes RLE Weight Bearing: Weight bearing as tolerated      Mobility Bed Mobility Overal bed mobility: Needs Assistance Bed Mobility: Supine to Sit     Supine to sit: HOB elevated;Min guard;Min assist Sit to supine: Min assist   General bed mobility comments: Pt reporting he could not advance RLE but was able to do so with min A to unweight/support at heel.   Transfers Overall transfer level: Needs assistance Equipment used: Rolling walker (2 wheeled) Transfers: Sit to/from Stand Sit to Stand: Min guard;From elevated surface         General transfer comment: Min guard for safety from elevated surface.     Balance Overall balance assessment: Needs assistance Sitting-balance support: No upper extremity supported;Feet supported Sitting balance-Leahy Scale: Good     Standing balance support: Bilateral upper extremity supported;During functional activity Standing  balance-Leahy Scale: Poor Standing balance comment: Reliant on BUE support                            ADL either performed or assessed with clinical judgement   ADL Overall ADL's : Needs assistance/impaired Eating/Feeding: Set up;Sitting   Grooming: Set up;Sitting;Wash/dry hands;Min guard;Standing   Upper Body Bathing: Set up;Sitting   Lower Body Bathing: Minimal assistance;Sit to/from stand   Upper Body Dressing : Set up;Sitting   Lower Body Dressing: Minimal assistance;Sit to/from stand   Toilet Transfer: Min guard;RW;Ambulation;Requires wide/bariatric   Toileting- Clothing Manipulation and Hygiene: Min guard;Sit to/from stand;Sitting/lateral lean;Minimal assistance;Set up   Tub/ Shower Transfer: Walk-in shower;Minimal assistance;Ambulation;3 in 1;Rolling walker   Functional mobility during ADLs: Min guard;Rolling walker General ADL Comments: Pt completed bed mobility, toilet transfer, pericare, grooming task standing at sink. Discussed LB dressing technique     Vision         Perception     Praxis      Pertinent Vitals/Pain Pain Assessment: Faces Faces Pain Scale: Hurts little more Pain Location: R knee  Pain Descriptors / Indicators: Aching;Operative site guarding Pain Intervention(s): Monitored during session;Limited activity within patient's tolerance;Repositioned     Hand Dominance Right   Extremity/Trunk Assessment Upper Extremity Assessment Upper Extremity Assessment: Generalized weakness   Lower Extremity Assessment Lower Extremity Assessment: Defer to PT evaluation   Cervical / Trunk Assessment Cervical / Trunk Assessment: (Increased body habitus)   Communication Communication Communication: No difficulties   Cognition Arousal/Alertness: Awake/alert Behavior During Therapy: WFL for tasks assessed/performed Overall Cognitive Status: Within Functional Limits for tasks assessed  General Comments       Exercises     Shoulder Instructions      Home Living Family/patient expects to be discharged to:: Private residence Living Arrangements: Spouse/significant other;Children Available Help at Discharge: Family;Available 24 hours/day Type of Home: House Home Access: Stairs to enter [employer] of Steps: 3 Entrance Stairs-Rails: Right;Left;Can reach both Home Layout: One level     Bathroom Shower/Tub: Occupational psychologist: Handicapped height     Home Equipment: Environmental consultant - 2 wheels;Bedside commode;Shower seat;Grab bars - tub/shower;Cane - single point          Prior Functioning/Environment Level of Independence: Independent with assistive device(s)        Comments: Reports use of cane for longer distances. Uses lift recliner at home.        OT Problem List: Impaired balance (sitting and/or standing);Decreased knowledge of use of DME or AE;Decreased knowledge of precautions;Pain;Obesity      OT Treatment/Interventions: Self-care/ADL training;DME and/or AE instruction;Therapeutic activities;Patient/family education;Balance training    OT Goals(Current goals can be found in the care plan section) Acute Rehab OT Goals Patient Stated Goal: to be able to walk better OT Goal Formulation: With patient Time For Goal Achievement: 01/13/19 Potential to Achieve Goals: Good  OT Frequency: Min 2X/week   Barriers to D/C:            Co-evaluation              AM-PAC OT "6 Clicks" Daily Activity     Outcome Measure Help from another person eating meals?: None Help from another person taking care of personal grooming?: None Help from another person toileting, which includes using toliet, bedpan, or urinal?: A Little Help from another person bathing (including washing, rinsing, drying)?: A Little Help from another person to put on and taking off regular upper body clothing?: None Help from another person to put on and taking  off regular lower body clothing?: A Little 6 Click Score: 21   End of Session Equipment Utilized During Treatment: Rolling walker Nurse Communication: Mobility status;Other (comment)(Pt needs bariatric recliner.)  Activity Tolerance: Patient tolerated treatment well;Patient limited by pain Patient left: in bed;with call bell/phone within reach  OT Visit Diagnosis: Unsteadiness on feet (R26.81);Pain;Muscle weakness (generalized) (M62.81)                Time: 1308-6578 OT Time Calculation (min): 23 min Charges:  OT General Charges $OT Visit: 1 Visit OT Evaluation $OT Eval Low Complexity: 1 Low OT Treatments $Self Care/Home Management : 8-22 mins  Tyrone Schimke, OT Acute Rehabilitation Services Pager: [phone] Office: [phone]   Hortencia Pilar 01/06/2019, 11:50 AM

## 2019-01-06 NOTE — Progress Notes (Signed)
Patient ID: Bill [name], male   DOB: 1966-05-03, 53 y.o.   MRN: [id number] Postoperative day 1 right total knee arthroplasty.  There is no drainage in the wound VAC canister.  Patient has full extension.  Patient states he is able to ambulate to the bathroom yesterday.  Plan for physical therapy today and discharge to home on Friday.
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
## 2019-01-07 MED ORDER — METHOCARBAMOL 500 MG PO TABS: 500.0000 mg | ORAL_TABLET | Freq: Four times a day (QID) | ORAL | 2 refills | Status: DC

## 2019-01-07 MED ORDER — OXYCODONE-ACETAMINOPHEN 10-325 MG PO TABS: 1.0000 | ORAL_TABLET | Freq: Four times a day (QID) | ORAL | 0 refills | Status: DC | PRN

## 2019-01-07 MED ORDER — ASPIRIN 325 MG PO TBEC: 325.0000 mg | DELAYED_RELEASE_TABLET | Freq: Every day | ORAL | 0 refills | Status: DC

## 2019-01-07 NOTE — Progress Notes (Signed)
RN gave pt discharge instructions, pt medications escribed to pharmacy in [place]. Pt not in any pain transferred to portable [facility]. Belongings packed. IV removed waiting for wife.

## 2019-01-07 NOTE — Progress Notes (Signed)
PT Cancellation Note  Patient Details Name: Bill [name] MRN: [id number] DOB: [birth year]   Cancelled Treatment:    Reason Eval/Treat Not Completed: (P) Patient declined, no reason specified(Pt declined reports his ride is here already and he has no PT concerns.)   Cristela Blue 01/07/2019, 10:02 AM Governor Rooks, PTA Acute Rehabilitation Services Pager [phone] Office [phone]

## 2019-01-07 NOTE — TOC Transition Note (Addendum)
Transition of Care [facility]) - CM/SW Discharge Note   Patient Details  Name: Bill [name] MRN: [id number] Date of Birth: 04/15/1966  [facility] CM/SW Contact:  Ninfa Meeker, RN Phone Number: 01/07/2019, 9:25 AM   Clinical Narrative:   53 yr old gentleman s/p right total knee replacement. Case manager spoke with patient via telephone concerning discharge plan and DME. Patient is insured through [place], [place] called referral to [facility], intake ID #[id number].CM was told that start of care could be 01/09/19. Patient will receive a call from the agency that will provide his therapy. Patient has all necessary DME  From previous surgeries, has handicap accessible home. He will have support of his wife and children at discharge.    Final next level of care: [place] Barriers to Discharge: No Barriers Identified   Patient Goals and CMS Choice Patient states their goals for this hospitalization and ongoing recovery are:: to recover   Choice offered to / list presented to : ([facility] with [employer] will arrange fore [facility] agency)  Discharge Placement                       Discharge Plan and Services   Discharge Planning Services: CM Consult Post Acute Care Choice: Home Health                    HH Arranged: PT [place]: (to be arranged by [facility]) Date [facility] Agency Contacted: 01/07/19 Time [place]: 4818 Representative spoke with at [place]: Representative with [facility]  Social Determinants of Health (SDOH) Interventions     Readmission Risk Interventions No flowsheet data found.

## 2019-01-07 NOTE — Discharge Summary (Signed)
Discharge Diagnoses:  Active Problems:   Unilateral primary osteoarthritis, right knee   S/P total knee arthroplasty   Surgeries: Procedure(s): RIGHT TOTAL KNEE ARTHROPLASTY on 01/05/2019    Consultants:   Discharged Condition: Improved  Hospital Course: Bill [name] is an 53 y.o. male who was admitted 01/05/2019 with a chief complaint of right knee osteoarthritis, with a final diagnosis of Osteoarthritis Right Knee.  Patient was brought to the operating room on 01/05/2019 and underwent Procedure(s): RIGHT TOTAL KNEE ARTHROPLASTY.    Patient was given perioperative antibiotics:  Anti-infectives (From admission, onward)   Start     Dose/Rate Route Frequency Ordered Stop   01/05/19 1445  ceFAZolin (ANCEF) IVPB 2g/100 mL premix     2 g 200 mL/hr over 30 Minutes Intravenous Every 6 hours 01/05/19 1434 01/05/19 2235   01/05/19 0800  ceFAZolin (ANCEF) 3 g in dextrose 5 % 50 mL IVPB     3 g 100 mL/hr over 30 Minutes Intravenous To ShortStay Surgical 01/04/19 0753 01/05/19 0845    .  Patient was given sequential compression devices, early ambulation, and aspirin for DVT prophylaxis.  Recent vital signs:  Patient Vitals for the past 24 hrs:  BP Temp Temp src Pulse Resp SpO2  01/07/19 0732 125/66 98.9 F (37.2 C) Oral 66 17 96 %  01/07/19 0358 (!) 120/52 98.1 F (36.7 C) Oral 61 16 95 %  01/06/19 2331 118/61 98.9 F (37.2 C) Oral 61 15 96 %  01/06/19 1936 (!) 111/52 98.2 F (36.8 C) Oral 67 16 96 %  01/06/19 1756 105/71 98.7 F (37.1 C) Oral 64 18 98 %  01/06/19 1151 126/78 98.1 F (36.7 C) Oral 99 18 97 %  .  Recent laboratory studies: No results found.  Discharge Medications:   Allergies as of 01/07/2019   No Known Allergies     Medication List    STOP taking these medications   HYDROcodone-acetaminophen 5-325 MG tablet Commonly known as: NORCO/VICODIN     TAKE these medications   aspirin 325 MG EC tablet Take 1 tablet (325 mg total) by mouth daily with  breakfast. What changed:   medication strength  how much to take  when to take this   bisacodyl 10 MG suppository Commonly known as: DULCOLAX Place 1 suppository (10 mg total) rectally daily as needed for moderate constipation.   bisoprolol 10 MG tablet Commonly known as: ZEBETA Take 10 mg by mouth at bedtime.   diclofenac 75 MG EC tablet Commonly known as: VOLTAREN Take 75 mg by mouth 2 (two) times daily with a meal.   diclofenac sodium 1 % Gel Commonly known as: VOLTAREN Apply 2 g topically 4 (four) times daily as needed.   docusate sodium 100 MG capsule Commonly known as: COLACE Take 1 capsule (100 mg total) by mouth 2 (two) times daily.   furosemide 20 MG tablet Commonly known as: LASIX Take 20 mg by mouth daily as needed for fluid.   hydrochlorothiazide 25 MG tablet Commonly known as: HYDRODIURIL Take 25 mg by mouth daily.   losartan 100 MG tablet Commonly known as: COZAAR Take 100 mg by mouth daily.   methocarbamol 500 MG tablet Commonly known as: Robaxin Take 1 tablet (500 mg total) by mouth 4 (four) times daily.   OVER THE COUNTER MEDICATION Take 250 mg by mouth daily. Forskolin extract otc appetite suppressor   oxyCODONE-acetaminophen 10-325 MG tablet Commonly known as: Percocet Take 1 tablet by mouth every 6 (six) hours as needed  for pain.   polyethylene glycol 17 g packet Commonly known as: MIRALAX / GLYCOLAX Take 17 g by mouth daily as needed for mild constipation.   pravastatin 20 MG tablet Commonly known as: PRAVACHOL Take 20 mg by mouth at bedtime.   topiramate 50 MG tablet Commonly known as: TOPAMAX Take 50 mg by mouth 2 (two) times daily.       Diagnostic Studies: No results found.  Patient benefited maximally from their hospital stay and there were no complications.     Disposition: Discharge disposition: 01-Home or Self Care      Discharge Instructions    Call MD / Call 911   Complete by: As directed    If you  experience chest pain or shortness of breath, CALL 911 and be transported to the hospital emergency room.  If you develope a fever above 101 F, pus (white drainage) or increased drainage or redness at the wound, or calf pain, call your surgeon's office.   Care order/instruction:   Complete by: As directed    Place Prevena VAC at DC and instruct patient to keep plugged into wall outlet as much as possible.   Constipation Prevention   Complete by: As directed    Drink plenty of fluids.  Prune juice may be helpful.  You may use a stool softener, such as Colace (over the counter) 100 mg twice a day.  Use MiraLax (over the counter) for constipation as needed.   Diet - low sodium heart healthy   Complete by: As directed    Increase activity slowly as tolerated   Complete by: As directed      Follow-up Information    Newt Minion, MD In 1 week.   Specialty: Orthopedic Surgery Contact information: [place] [address] [phone]            Signed: Ulysees Barns 01/07/2019, 8:32 AM  [employer] [phone]

## 2019-01-07 NOTE — Progress Notes (Signed)
Subjective: 2 Days Post-Op Procedure(s) (LRB): RIGHT TOTAL KNEE ARTHROPLASTY (Right) Patient reports pain as moderate and severe.    Objective: Vital signs in last 24 hours: Temp:  [98.1 F (36.7 C)-98.9 F (37.2 C)] 98.9 F (37.2 C) (06/12 0732) Pulse Rate:  [61-99] 66 (06/12 0732) Resp:  [15-18] 17 (06/12 0732) BP: (105-126)/(52-78) 125/66 (06/12 0732) SpO2:  [95 %-98 %] 96 % (06/12 0732)  Intake/Output from previous day: 06/11 0701 - 06/12 0700 In: 480 [P.O.:480] Out: 900 [Urine:900] Intake/Output this shift: No intake/output data recorded.  Recent Labs    01/04/19 1152  HGB 15.3   Recent Labs    01/04/19 1152  WBC 5.7  RBC 5.36  HCT 47.3  PLT 192   Recent Labs    01/04/19 1152  NA 140  K 3.5  CL 109  CO2 22  BUN 18  CREATININE 1.37*  GLUCOSE 103*  CALCIUM 8.9   No results for input(s): LABPT, INR in the last 72 hours.  VAC dressing intact over the right knee incision and functioning well. No drainage in VAC canister.    Assessment/Plan: 2 Days Post-Op Procedure(s) (LRB): RIGHT TOTAL KNEE ARTHROPLASTY (Right) Discharge home with home health   Anticipated LOS equal to or greater than 2 midnights due to - Age 53 and older with one or more of the following:  - Obesity  - Expected need for hospital services (PT, OT, Nursing) required for safe  discharge  - Anticipated need for postoperative skilled nursing care or inpatient rehab  - Active co-morbidities: None OR   - Unanticipated findings during/Post Surgery: None  - Patient is a high risk of re-admission due to: None    Carmine Carrozza, PA-C 01/07/2019, 8:19 AM  [place]

## 2019-01-10 ENCOUNTER — Telehealth: Payer: Self-pay | Admitting: Physician Assistant

## 2019-01-10 NOTE — Telephone Encounter (Signed)
Rachel with [facility] called advised the have exhausted all revenues out of network and in network. Apolonio Schneiders asked if there is alternative plan of care for the patient? She asked if out patient would be an option for the patient? The number to contact Apolonio Schneiders is [phone] EXT [phone]

## 2019-01-10 NOTE — Care Management (Signed)
01/10/19 3:15pm Case manager received call from representative with [facility], stating they can not locate any Home Health agency for patient in [place]. CM contacted Shawn Rayburn PA and updated her. Will contact patient and see where he went for outpatient therapy  wiith previous knee surgery.

## 2019-01-10 NOTE — Telephone Encounter (Signed)
Shawn please advise 

## 2019-01-11 ENCOUNTER — Telehealth: Payer: Self-pay | Admitting: Radiology

## 2019-01-11 ENCOUNTER — Telehealth: Payer: Self-pay | Admitting: *Deleted

## 2019-01-11 NOTE — Telephone Encounter (Signed)
Orders will be sent to [facility] as requested.

## 2019-01-11 NOTE — Telephone Encounter (Signed)
TO MEDICAL RECORDS TO FAX.

## 2019-01-11 NOTE — Telephone Encounter (Signed)
Manuela Schwartz - Case Manager at [facility] called requesting order for patient.  Outpatient physical therapy with [facility] [place] Fax [phone] Phone [phone]  Please give Manuela Schwartz a call with any questions and when order is placed/faxed, so she can contact facility they will get patient scheduled once order is sent. [phone]

## 2019-01-11 NOTE — Telephone Encounter (Signed)
Office RNCM asked to assist with setting up HHPT for patient s/p Right-Total knee replacement, as he was discharged home over the weekend with anticipation of beginning therapy. Office has been notified by [employer] rep that they are having a difficult time finding services in his area. RNCM contacted Apolonio Schneiders with [facility] and discussed that per PA, OPPT would be fine to begin in place of HHPT, if all options have been exhausted. RNCM spoke with hospital CM today and she has been in touch with [facility] for patient. She confirmed with the patient that this is what was done previously for his left total knee replacement. Order to be faxed by office to [facility] for OPPT to begin ASAP. RNCM also spoke with Jana Half at [facility] and she confirmed that she will be scheduling patient for OPPT as soon as order is received, most likely start date tomorrow, 01/12/19. Updated Apolonio Schneiders with [facility] as well.

## 2019-01-11 NOTE — Telephone Encounter (Signed)
Prescription written for Outpatient PT as requested and to be faxed as requested.

## 2019-01-14 ENCOUNTER — Other Ambulatory Visit: Payer: Self-pay

## 2019-01-14 ENCOUNTER — Encounter: Payer: Self-pay | Admitting: Physician Assistant

## 2019-01-14 ENCOUNTER — Ambulatory Visit (INDEPENDENT_AMBULATORY_CARE_PROVIDER_SITE_OTHER): Payer: Managed Care, Other (non HMO) | Admitting: Physician Assistant

## 2019-01-14 VITALS — Ht 73.0 in | Wt >= 6400 oz

## 2019-01-14 DIAGNOSIS — Z6841 Body Mass Index (BMI) 40.0 and over, adult: Secondary | ICD-10-CM

## 2019-01-14 DIAGNOSIS — Z96651 Presence of right artificial knee joint: Secondary | ICD-10-CM

## 2019-01-14 MED ORDER — OXYCODONE-ACETAMINOPHEN 10-325 MG PO TABS: 1.0000 | ORAL_TABLET | Freq: Four times a day (QID) | ORAL | 0 refills | Status: AC | PRN
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
## 2019-01-17 ENCOUNTER — Encounter: Payer: Self-pay | Admitting: Orthopedic Surgery

## 2019-01-17 ENCOUNTER — Other Ambulatory Visit: Payer: Self-pay

## 2019-01-17 ENCOUNTER — Ambulatory Visit (INDEPENDENT_AMBULATORY_CARE_PROVIDER_SITE_OTHER): Payer: Managed Care, Other (non HMO) | Admitting: Orthopedic Surgery

## 2019-01-17 VITALS — Ht 73.0 in | Wt >= 6400 oz

## 2019-01-17 DIAGNOSIS — Z96651 Presence of right artificial knee joint: Secondary | ICD-10-CM

## 2019-01-20 ENCOUNTER — Encounter: Payer: Self-pay | Admitting: Orthopedic Surgery
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
## 2019-01-21 ENCOUNTER — Encounter: Payer: Self-pay | Admitting: Physician Assistant

## 2019-01-21 ENCOUNTER — Ambulatory Visit (INDEPENDENT_AMBULATORY_CARE_PROVIDER_SITE_OTHER): Payer: Managed Care, Other (non HMO) | Admitting: Physician Assistant

## 2019-01-21 ENCOUNTER — Other Ambulatory Visit: Payer: Self-pay

## 2019-01-21 VITALS — Ht 73.0 in | Wt >= 6400 oz

## 2019-01-21 DIAGNOSIS — Z96651 Presence of right artificial knee joint: Secondary | ICD-10-CM

## 2019-01-21 MED ORDER — HYDROCODONE-ACETAMINOPHEN 7.5-325 MG PO TABS: 1.0000 | ORAL_TABLET | Freq: Four times a day (QID) | ORAL | 0 refills | Status: DC | PRN
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
## 2019-01-29 ENCOUNTER — Other Ambulatory Visit: Payer: Self-pay | Admitting: Physician Assistant

## 2019-02-11 ENCOUNTER — Ambulatory Visit (INDEPENDENT_AMBULATORY_CARE_PROVIDER_SITE_OTHER): Payer: Managed Care, Other (non HMO) | Admitting: Physician Assistant

## 2019-02-11 ENCOUNTER — Encounter: Payer: Self-pay | Admitting: Physician Assistant

## 2019-02-11 VITALS — Ht 73.0 in | Wt >= 6400 oz

## 2019-02-11 DIAGNOSIS — T8149XA Infection following a procedure, other surgical site, initial encounter: Secondary | ICD-10-CM

## 2019-02-11 DIAGNOSIS — Z96651 Presence of right artificial knee joint: Secondary | ICD-10-CM

## 2019-02-11 DIAGNOSIS — T8141XA Infection following a procedure, superficial incisional surgical site, initial encounter: Secondary | ICD-10-CM

## 2019-02-11 MED ORDER — DOXYCYCLINE HYCLATE 100 MG PO CAPS: 100.0000 mg | ORAL_CAPSULE | Freq: Two times a day (BID) | ORAL | 1 refills | Status: DC

## 2019-02-11 MED ORDER — HYDROCODONE-ACETAMINOPHEN 7.5-325 MG PO TABS: 1.0000 | ORAL_TABLET | Freq: Four times a day (QID) | ORAL | 0 refills | Status: DC | PRN

## 2019-02-11 MED ORDER — MUPIROCIN 2 % EX OINT: 1.0000 "application " | TOPICAL_OINTMENT | Freq: Every day | CUTANEOUS | 0 refills | Status: DC
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
## 2019-02-17 ENCOUNTER — Encounter: Payer: Self-pay | Admitting: Physician Assistant

## 2019-02-25 ENCOUNTER — Other Ambulatory Visit: Payer: Self-pay

## 2019-02-25 ENCOUNTER — Encounter: Payer: Self-pay | Admitting: Physician Assistant

## 2019-02-25 ENCOUNTER — Ambulatory Visit (INDEPENDENT_AMBULATORY_CARE_PROVIDER_SITE_OTHER): Payer: Managed Care, Other (non HMO) | Admitting: Physician Assistant

## 2019-02-25 DIAGNOSIS — Z6841 Body Mass Index (BMI) 40.0 and over, adult: Secondary | ICD-10-CM

## 2019-02-25 DIAGNOSIS — Z96651 Presence of right artificial knee joint: Secondary | ICD-10-CM

## 2019-02-25 DIAGNOSIS — T8149XA Infection following a procedure, other surgical site, initial encounter: Secondary | ICD-10-CM

## 2019-02-25 DIAGNOSIS — T8141XA Infection following a procedure, superficial incisional surgical site, initial encounter: Secondary | ICD-10-CM

## 2019-02-25 MED ORDER — HYDROCODONE-ACETAMINOPHEN 7.5-325 MG PO TABS: 1.0000 | ORAL_TABLET | Freq: Four times a day (QID) | ORAL | 0 refills | Status: DC | PRN
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
## 2019-03-24 ENCOUNTER — Encounter: Payer: Self-pay | Admitting: Orthopedic Surgery

## 2019-03-24 ENCOUNTER — Ambulatory Visit (INDEPENDENT_AMBULATORY_CARE_PROVIDER_SITE_OTHER): Payer: Managed Care, Other (non HMO) | Admitting: Orthopedic Surgery

## 2019-03-24 VITALS — Ht 73.0 in | Wt >= 6400 oz

## 2019-03-24 DIAGNOSIS — Z96651 Presence of right artificial knee joint: Secondary | ICD-10-CM

## 2019-03-25 ENCOUNTER — Encounter: Payer: Self-pay | Admitting: Orthopedic Surgery
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
## 2020-02-06 ENCOUNTER — Ambulatory Visit: Payer: Managed Care, Other (non HMO) | Admitting: Orthopedic Surgery

## 2020-02-07 ENCOUNTER — Ambulatory Visit: Payer: Managed Care, Other (non HMO) | Admitting: Orthopedic Surgery

## 2020-05-03 ENCOUNTER — Ambulatory Visit: Payer: Managed Care, Other (non HMO) | Admitting: Orthopedic Surgery

## 2020-05-08 ENCOUNTER — Ambulatory Visit: Payer: Self-pay

## 2020-05-08 ENCOUNTER — Ambulatory Visit (INDEPENDENT_AMBULATORY_CARE_PROVIDER_SITE_OTHER): Payer: Managed Care, Other (non HMO) | Admitting: Physician Assistant

## 2020-05-08 ENCOUNTER — Encounter: Payer: Self-pay | Admitting: Physician Assistant

## 2020-05-08 DIAGNOSIS — Z96652 Presence of left artificial knee joint: Secondary | ICD-10-CM | POA: Diagnosis not present
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
## 2020-05-14 ENCOUNTER — Ambulatory Visit (INDEPENDENT_AMBULATORY_CARE_PROVIDER_SITE_OTHER): Payer: Managed Care, Other (non HMO) | Admitting: Orthopedic Surgery

## 2020-05-14 ENCOUNTER — Encounter: Payer: Self-pay | Admitting: Physician Assistant

## 2020-05-14 VITALS — Ht 73.0 in | Wt >= 6400 oz

## 2020-05-14 DIAGNOSIS — R29898 Other symptoms and signs involving the musculoskeletal system: Secondary | ICD-10-CM

## 2020-05-14 DIAGNOSIS — Z96652 Presence of left artificial knee joint: Secondary | ICD-10-CM | POA: Diagnosis not present

## 2020-05-14 DIAGNOSIS — I872 Venous insufficiency (chronic) (peripheral): Secondary | ICD-10-CM
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
## 2020-05-15 ENCOUNTER — Ambulatory Visit: Payer: Managed Care, Other (non HMO) | Admitting: Physician Assistant

## 2020-05-22 ENCOUNTER — Ambulatory Visit (INDEPENDENT_AMBULATORY_CARE_PROVIDER_SITE_OTHER): Payer: Managed Care, Other (non HMO) | Admitting: Orthopedic Surgery

## 2020-05-22 ENCOUNTER — Encounter: Payer: Self-pay | Admitting: Orthopedic Surgery

## 2020-05-22 VITALS — Ht 74.0 in | Wt >= 6400 oz

## 2020-05-22 DIAGNOSIS — Z96652 Presence of left artificial knee joint: Secondary | ICD-10-CM | POA: Diagnosis not present

## 2020-05-22 DIAGNOSIS — Z96651 Presence of right artificial knee joint: Secondary | ICD-10-CM

## 2020-05-23 ENCOUNTER — Encounter: Payer: Self-pay | Admitting: Orthopedic Surgery
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
## 2020-05-28 ENCOUNTER — Telehealth: Payer: Self-pay | Admitting: Orthopedic Surgery

## 2020-05-28 ENCOUNTER — Other Ambulatory Visit: Payer: Self-pay

## 2020-05-28 DIAGNOSIS — Z96651 Presence of right artificial knee joint: Secondary | ICD-10-CM

## 2020-05-28 DIAGNOSIS — Z96652 Presence of left artificial knee joint: Secondary | ICD-10-CM

## 2020-05-28 NOTE — Telephone Encounter (Signed)
Faxed

## 2020-05-28 NOTE — Telephone Encounter (Signed)
Patient called. He would like PT. He has [employer] so he will not be able to come to [facility] for PT. He would like a referral sent to a PT facility in [place]. His call back number is [phone]

## 2020-05-28 NOTE — Telephone Encounter (Signed)
I called Bill [name] and he would like a referral tp [facility] in [place] the phone number is [phone] and fax number is [phone]. I advised that I will sent Bill [name] referral today. To call with any questions.

## 2020-06-05 ENCOUNTER — Encounter: Payer: Self-pay | Admitting: Orthopedic Surgery

## 2020-06-05 ENCOUNTER — Ambulatory Visit (INDEPENDENT_AMBULATORY_CARE_PROVIDER_SITE_OTHER): Payer: Managed Care, Other (non HMO) | Admitting: Orthopedic Surgery

## 2020-06-05 DIAGNOSIS — M7652 Patellar tendinitis, left knee: Secondary | ICD-10-CM | POA: Diagnosis not present
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
## 2020-06-07 ENCOUNTER — Ambulatory Visit: Payer: Managed Care, Other (non HMO) | Admitting: Orthopedic Surgery

## 2020-06-11 ENCOUNTER — Ambulatory Visit: Payer: Managed Care, Other (non HMO) | Admitting: Physician Assistant

## 2020-07-25 ENCOUNTER — Encounter: Payer: Self-pay | Admitting: Orthopedic Surgery

## 2020-07-25 ENCOUNTER — Ambulatory Visit (INDEPENDENT_AMBULATORY_CARE_PROVIDER_SITE_OTHER): Payer: Managed Care, Other (non HMO) | Admitting: Orthopedic Surgery

## 2020-07-25 VITALS — Ht 74.0 in | Wt >= 6400 oz

## 2020-07-25 DIAGNOSIS — Z96652 Presence of left artificial knee joint: Secondary | ICD-10-CM

## 2020-07-25 DIAGNOSIS — M7632 Iliotibial band syndrome, left leg: Secondary | ICD-10-CM

## 2020-07-25 DIAGNOSIS — M7652 Patellar tendinitis, left knee: Secondary | ICD-10-CM

## 2020-07-25 MED ORDER — MELOXICAM 15 MG PO TABS: 15.0000 mg | ORAL_TABLET | Freq: Every day | ORAL | 3 refills | Status: DC
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
## 2020-08-21 ENCOUNTER — Ambulatory Visit (INDEPENDENT_AMBULATORY_CARE_PROVIDER_SITE_OTHER): Payer: Managed Care, Other (non HMO) | Admitting: Orthopedic Surgery

## 2020-08-21 ENCOUNTER — Encounter: Payer: Self-pay | Admitting: Orthopedic Surgery

## 2020-08-21 DIAGNOSIS — Z6841 Body Mass Index (BMI) 40.0 and over, adult: Secondary | ICD-10-CM | POA: Diagnosis not present

## 2020-08-21 DIAGNOSIS — Z96652 Presence of left artificial knee joint: Secondary | ICD-10-CM

## 2020-08-21 DIAGNOSIS — I872 Venous insufficiency (chronic) (peripheral): Secondary | ICD-10-CM | POA: Diagnosis not present

## 2020-08-21 DIAGNOSIS — M25562 Pain in left knee: Secondary | ICD-10-CM | POA: Diagnosis not present

## 2020-08-21 DIAGNOSIS — G8929 Other chronic pain: Secondary | ICD-10-CM

## 2020-08-21 MED ORDER — DOXYCYCLINE HYCLATE 100 MG PO TABS: 100.0000 mg | ORAL_TABLET | Freq: Two times a day (BID) | ORAL | 0 refills | Status: DC
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
## 2020-08-22 ENCOUNTER — Other Ambulatory Visit: Payer: Self-pay | Admitting: Orthopedic Surgery

## 2020-08-22 LAB — COMPREHENSIVE METABOLIC PANEL
AG Ratio: 1.6 (calc) (ref 1.0–2.5)
ALT: 21 U/L (ref 9–46)
AST: 17 U/L (ref 10–35)
Albumin: 4.4 g/dL (ref 3.6–5.1)
Alkaline phosphatase (APISO): 109 U/L (ref 35–144)
BUN: 23 mg/dL (ref 7–25)
CO2: 24 mmol/L (ref 20–32)
Calcium: 9.4 mg/dL (ref 8.6–10.3)
Chloride: 102 mmol/L (ref 98–110)
Creat: 1.19 mg/dL (ref 0.70–1.33)
Globulin: 2.8 g/dL (calc) (ref 1.9–3.7)
Glucose, Bld: 81 mg/dL (ref 65–99)
Potassium: 3.8 mmol/L (ref 3.5–5.3)
Sodium: 140 mmol/L (ref 135–146)
Total Bilirubin: 0.5 mg/dL (ref 0.2–1.2)
Total Protein: 7.2 g/dL (ref 6.1–8.1)

## 2020-08-22 LAB — CBC WITH DIFFERENTIAL/PLATELET
Absolute Monocytes: 480 cells/uL (ref 200–950)
Basophils Absolute: 48 cells/uL (ref 0–200)
Basophils Relative: 0.8 %
Eosinophils Absolute: 72 cells/uL (ref 15–500)
Eosinophils Relative: 1.2 %
HCT: 43.5 % (ref 38.5–50.0)
Hemoglobin: 14.5 g/dL (ref 13.2–17.1)
Lymphs Abs: 906 cells/uL (ref 850–3900)
MCH: 27.8 pg (ref 27.0–33.0)
MCHC: 33.3 g/dL (ref 32.0–36.0)
MCV: 83.3 fL (ref 80.0–100.0)
MPV: 9.8 fL (ref 7.5–12.5)
Monocytes Relative: 8 %
Neutro Abs: 4494 cells/uL (ref 1500–7800)
Neutrophils Relative %: 74.9 %
Platelets: 276 10*3/uL (ref 140–400)
RBC: 5.22 10*6/uL (ref 4.20–5.80)
RDW: 13.1 % (ref 11.0–15.0)
Total Lymphocyte: 15.1 %
WBC: 6 10*3/uL (ref 3.8–10.8)

## 2020-08-22 LAB — C-REACTIVE PROTEIN: CRP: 21.8 mg/L — ABNORMAL HIGH (ref <8.0)

## 2020-08-22 LAB — SEDIMENTATION RATE: Sed Rate: 39 mm/h — ABNORMAL HIGH (ref 0–20)

## 2020-08-22 NOTE — Telephone Encounter (Signed)
Pls advise.  

## 2020-08-30 ENCOUNTER — Encounter (HOSPITAL_COMMUNITY): Payer: Managed Care, Other (non HMO)

## 2020-08-31 ENCOUNTER — Telehealth: Payer: Self-pay | Admitting: Physician Assistant

## 2020-08-31 ENCOUNTER — Encounter (HOSPITAL_COMMUNITY)
Admission: RE | Admit: 2020-08-31 | Discharge: 2020-08-31 | Disposition: A | Discharge status: Home / Self Care | Payer: Managed Care, Other (non HMO) | Source: Ambulatory Visit | Attending: Orthopedic Surgery | Admitting: Orthopedic Surgery

## 2020-08-31 ENCOUNTER — Other Ambulatory Visit: Payer: Self-pay

## 2020-08-31 DIAGNOSIS — G8929 Other chronic pain: Secondary | ICD-10-CM | POA: Insufficient documentation

## 2020-08-31 DIAGNOSIS — Z96652 Presence of left artificial knee joint: Secondary | ICD-10-CM | POA: Diagnosis present

## 2020-08-31 DIAGNOSIS — M25562 Pain in left knee: Secondary | ICD-10-CM | POA: Insufficient documentation

## 2020-08-31 MED ORDER — TECHNETIUM TC 99M MEDRONATE IV KIT
20.0000 | PACK | Freq: Once | INTRAVENOUS | Status: AC | PRN
  Administered 2020-08-31: 22 via INTRAVENOUS

## 2020-08-31 NOTE — Telephone Encounter (Signed)
I called to advise that this is over the counter and no rx needed. Voiced understanding and will call with any questions.

## 2020-08-31 NOTE — Telephone Encounter (Signed)
Patient called requesting  Refill of topical gel for joint pain. Diclofenac sodium. Please send to [facility] [address] [place]. Patient phone number is [phone].

## 2020-09-04 ENCOUNTER — Ambulatory Visit: Payer: Managed Care, Other (non HMO) | Admitting: Orthopedic Surgery

## 2020-09-06 ENCOUNTER — Ambulatory Visit (INDEPENDENT_AMBULATORY_CARE_PROVIDER_SITE_OTHER): Payer: Managed Care, Other (non HMO) | Admitting: Orthopedic Surgery

## 2020-09-06 DIAGNOSIS — M009 Pyogenic arthritis, unspecified: Secondary | ICD-10-CM | POA: Diagnosis not present

## 2020-09-06 DIAGNOSIS — Z96659 Presence of unspecified artificial knee joint: Secondary | ICD-10-CM | POA: Diagnosis not present

## 2020-09-06 DIAGNOSIS — T8459XS Infection and inflammatory reaction due to other internal joint prosthesis, sequela: Secondary | ICD-10-CM

## 2020-09-12 ENCOUNTER — Other Ambulatory Visit: Payer: Self-pay | Admitting: Orthopedic Surgery

## 2020-09-13 ENCOUNTER — Encounter: Payer: Self-pay | Admitting: Orthopedic Surgery

## 2020-09-13 ENCOUNTER — Other Ambulatory Visit: Payer: Self-pay
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
## 2020-09-14 NOTE — Pre-Procedure Instructions (Signed)
Surgical Instructions    Your procedure is scheduled on Wednesday, February 23rd.  Report to [facility] Main Entrance "A" at 8:00 A.M., then check in with the Admitting office.  Call this number if you have problems the morning of surgery:  [phone]   If you have any questions prior to your surgery date call [phone]: Open Monday-Friday 8am-4pm    Remember:  Do not eat after midnight the night before your surgery  You may drink clear liquids until 7:00 A.M. the morning of your surgery.   Clear liquids allowed are: Water, Non-Citrus Juices (without pulp), Carbonated Beverages, Clear Tea, Black Coffee Only, and Gatorade    Take these medicines the morning of surgery with A SIP OF WATER  pravastatin (PRAVACHOL) topiramate (TOPAMAX)  acetaminophen (TYLENOL)-as needed  As of today, STOP taking any Aspirin (unless otherwise instructed by your surgeon) Aleve, Naproxen, Ibuprofen, Motrin, Advil, Goody's, BC's, all herbal medications, fish oil, and all vitamins. This includes: diclofenac (VOLTAREN), diclofenac sodium (VOLTAREN) 1 % GEL and meloxicam (MOBIC).                      Do not wear jewelry.            Do not wear lotions, powders, colognes, or deodorant.            Men may shave face and neck.            Do not bring valuables to the hospital.            [facility] is not responsible for any belongings or valuables.  Do NOT Smoke (Tobacco/Vaping) or drink Alcohol 24 hours prior to your procedure If you use a CPAP at night, you may bring all equipment for your overnight stay.   Contacts, glasses, dentures or bridgework may not be worn into surgery, please bring cases for these belongings   For patients admitted to the hospital, discharge time will be determined by your treatment team.   Patients discharged the day of surgery will not be allowed to drive home, and someone needs to stay with them for 24 hours.    Special instructions:   [facility]- Preparing For  Surgery  Before surgery, you can play an important role. Because skin is not sterile, your skin needs to be as free of germs as possible. You can reduce the number of germs on your skin by washing with CHG (chlorahexidine gluconate) Soap before surgery.  CHG is an antiseptic cleaner which kills germs and bonds with the skin to continue killing germs even after washing.    Oral Hygiene is also important to reduce your risk of infection.  Remember - BRUSH YOUR TEETH THE MORNING OF SURGERY WITH YOUR REGULAR TOOTHPASTE  Please do not use if you have an allergy to CHG or antibacterial soaps. If your skin becomes reddened/irritated stop using the CHG.  Do not shave (including legs and underarms) for at least 48 hours prior to first CHG shower. It is OK to shave your face.  Please follow these instructions carefully.   1. Shower the NIGHT BEFORE SURGERY and the MORNING OF SURGERY  2. If you chose to wash your hair, wash your hair first as usual with your normal shampoo.  3. After you shampoo, rinse your hair and body thoroughly to remove the shampoo.  4. Wash Face and genitals (private parts) with your normal soap.   5.  Shower the NIGHT BEFORE SURGERY and the [facility]  OF SURGERY with CHG Soap.   6. Use CHG Soap as you would any other liquid soap. You can apply CHG directly to the skin and wash gently with a scrungie or a clean washcloth.   7. Apply the CHG Soap to your body ONLY FROM THE NECK DOWN.  Do not use on open wounds or open sores. Avoid contact with your eyes, ears, mouth and genitals (private parts). Wash Face and genitals (private parts)  with your normal soap.   8. Wash thoroughly, paying special attention to the area where your surgery will be performed.  9. Thoroughly rinse your body with warm water from the neck down.  10. DO NOT shower/wash with your normal soap after using and rinsing off the CHG Soap.  11. Pat yourself dry with a CLEAN TOWEL.  12. Wear CLEAN PAJAMAS to bed  the night before surgery  13. Place CLEAN SHEETS on your bed the night before your surgery  14. DO NOT SLEEP WITH PETS.   Day of Surgery: Wear Clean/Comfortable clothing the morning of surgery Do not apply any deodorants/lotions.   Remember to brush your teeth WITH YOUR REGULAR TOOTHPASTE.   Please read over the following fact sheets that you were given.

## 2020-09-17 ENCOUNTER — Other Ambulatory Visit: Payer: Self-pay

## 2020-09-17 ENCOUNTER — Other Ambulatory Visit (HOSPITAL_COMMUNITY)
Admission: RE | Admit: 2020-09-17 | Discharge: 2020-09-17 | Disposition: A | Discharge status: Home / Self Care | Payer: Managed Care, Other (non HMO) | Source: Ambulatory Visit | Attending: Orthopedic Surgery | Admitting: Orthopedic Surgery

## 2020-09-17 ENCOUNTER — Other Ambulatory Visit: Payer: Self-pay | Admitting: Physician Assistant

## 2020-09-17 ENCOUNTER — Encounter (HOSPITAL_COMMUNITY): Payer: Self-pay

## 2020-09-17 ENCOUNTER — Encounter (HOSPITAL_COMMUNITY)
Admission: RE | Admit: 2020-09-17 | Discharge: 2020-09-17 | Disposition: A | Discharge status: Home / Self Care | Payer: Managed Care, Other (non HMO) | Source: Ambulatory Visit | Attending: Orthopedic Surgery | Admitting: Orthopedic Surgery

## 2020-09-17 DIAGNOSIS — Z20822 Contact with and (suspected) exposure to covid-19: Secondary | ICD-10-CM | POA: Insufficient documentation

## 2020-09-17 DIAGNOSIS — Z6841 Body Mass Index (BMI) 40.0 and over, adult: Secondary | ICD-10-CM | POA: Insufficient documentation

## 2020-09-17 DIAGNOSIS — G4733 Obstructive sleep apnea (adult) (pediatric): Secondary | ICD-10-CM | POA: Insufficient documentation

## 2020-09-17 DIAGNOSIS — Z01818 Encounter for other preprocedural examination: Secondary | ICD-10-CM | POA: Insufficient documentation

## 2020-09-17 DIAGNOSIS — I10 Essential (primary) hypertension: Secondary | ICD-10-CM | POA: Insufficient documentation

## 2020-09-17 DIAGNOSIS — Z791 Long term (current) use of non-steroidal anti-inflammatories (NSAID): Secondary | ICD-10-CM | POA: Insufficient documentation

## 2020-09-17 DIAGNOSIS — Z01812 Encounter for preprocedural laboratory examination: Secondary | ICD-10-CM | POA: Insufficient documentation

## 2020-09-17 DIAGNOSIS — Z79899 Other long term (current) drug therapy: Secondary | ICD-10-CM | POA: Insufficient documentation

## 2020-09-17 DIAGNOSIS — Z7901 Long term (current) use of anticoagulants: Secondary | ICD-10-CM | POA: Insufficient documentation

## 2020-09-17 DIAGNOSIS — Z87891 Personal history of nicotine dependence: Secondary | ICD-10-CM | POA: Insufficient documentation

## 2020-09-17 DIAGNOSIS — M12862 Other specific arthropathies, not elsewhere classified, left knee: Secondary | ICD-10-CM | POA: Insufficient documentation

## 2020-09-17 LAB — COMPREHENSIVE METABOLIC PANEL
ALT: 21 U/L (ref 0–44)
AST: 17 U/L (ref 15–41)
Albumin: 4 g/dL (ref 3.5–5.0)
Alkaline Phosphatase: 102 U/L (ref 38–126)
Anion gap: 7 (ref 5–15)
BUN: 16 mg/dL (ref 6–20)
CO2: 28 mmol/L (ref 22–32)
Calcium: 9.2 mg/dL (ref 8.9–10.3)
Chloride: 104 mmol/L (ref 98–111)
Creatinine, Ser: 1.07 mg/dL (ref 0.61–1.24)
GFR, Estimated: >60 mL/min (ref >60)
Glucose, Bld: 103 mg/dL — ABNORMAL HIGH (ref 70–99)
Potassium: 3.9 mmol/L (ref 3.5–5.1)
Sodium: 139 mmol/L (ref 135–145)
Total Bilirubin: 0.6 mg/dL (ref 0.3–1.2)
Total Protein: 7.6 g/dL (ref 6.5–8.1)

## 2020-09-17 LAB — CBC
HCT: 46.3 % (ref 39.0–52.0)
Hemoglobin: 14.7 g/dL (ref 13.0–17.0)
MCH: 27.3 pg (ref 26.0–34.0)
MCHC: 31.7 g/dL (ref 30.0–36.0)
MCV: 86.1 fL (ref 80.0–100.0)
Platelets: 244 10*3/uL (ref 150–400)
RBC: 5.38 MIL/uL (ref 4.22–5.81)
RDW: 14.2 % (ref 11.5–15.5)
WBC: 6.5 10*3/uL (ref 4.0–10.5)
nRBC: 0 % (ref 0.0–0.2)

## 2020-09-17 LAB — SURGICAL PCR SCREEN
MRSA, PCR: NEGATIVE
Staphylococcus aureus: POSITIVE — AB

## 2020-09-17 NOTE — Progress Notes (Signed)
PCP - [facility] - Nancy Nordmann, NP Cardiologist - Dr Tamsen Meek  Chest x-ray - n/a EKG - 09/17/20 Stress Test - n/a ECHO - n/a Cardiac Cath - n/a  Sleep Study -  Yes CPAP - uses cpap nightly  ERAS: Clears til 7 am Wed., day of surgery.  Anesthesia review: Yes  Coronavirus Screening Covid test is scheduled on 09/17/20. Do you have any of the following symptoms:  Cough yes/no: No Fever (>100.30F)  yes/no: No Runny nose yes/no: No Sore throat yes/no: No Difficulty breathing/shortness of breath  yes/no: No  Have you traveled in the last 14 days and where? yes/no: No  Patient verbalized understanding of instructions that were given to them at the PAT appointment.

## 2020-09-17 NOTE — Pre-Procedure Instructions (Signed)
Surgical Instructions   Your procedure is scheduled on Wednesday, February 23rd.  Report to [facility] Main Entrance "A" at 8:00 A.M., then check in with the Admitting office.  Call this number if you have problems the morning of surgery:  [phone]   If you have any questions prior to your surgery date call [phone]: Open Monday-Friday 8am-4pm    Remember:  Do not eat after midnight the night before your surgery  You may drink clear liquids until 7:00 A.M. the morning of your surgery.   Clear liquids allowed are: Water, Non-Citrus Juices (without pulp), Carbonated Beverages, Clear Tea, Black Coffee Only, and Gatorade  Please complete your PRE-SURGERY ENSURE that was provided to you by 7 am the morning of surgery.  Please, if able, drink it in one setting. DO NOT SIP.Surgical Instructions     Take these medicines the morning of surgery with A SIP OF WATER  pravastatin (PRAVACHOL) topiramate (TOPAMAX)  acetaminophen (TYLENOL)-as needed  As of today, STOP taking any Aspirin (unless otherwise instructed by your surgeon) Aleve, Naproxen, Ibuprofen, Motrin, Advil, Goody's, BC's, all herbal medications, fish oil, and all vitamins. This includes: diclofenac (VOLTAREN), diclofenac sodium (VOLTAREN) 1 % GEL and meloxicam (MOBIC).                      Do not wear jewelry.            Do not wear lotions, powders, colognes, or deodorant.            Men may shave face and neck.            Do not bring valuables to the hospital.            [facility] is not responsible for any belongings or valuables.  Do NOT Smoke (Tobacco/Vaping) or drink Alcohol 24 hours prior to your procedure If you use a CPAP at night, you may bring all equipment for your overnight stay.   Contacts, glasses, dentures or bridgework may not be worn into surgery, please bring cases for these belongings   For patients admitted to the hospital, discharge time will be determined by your treatment team.   Patients  discharged the day of surgery will not be allowed to drive home, and someone needs to stay with them for 24 hours.    Special instructions:   [facility]- Preparing For Surgery  Before surgery, you can play an important role. Because skin is not sterile, your skin needs to be as free of germs as possible. You can reduce the number of germs on your skin by washing with CHG (chlorahexidine gluconate) Soap before surgery.  CHG is an antiseptic cleaner which kills germs and bonds with the skin to continue killing germs even after washing.    Oral Hygiene is also important to reduce your risk of infection.  Remember - BRUSH YOUR TEETH THE MORNING OF SURGERY WITH YOUR REGULAR TOOTHPASTE  Please do not use if you have an allergy to CHG or antibacterial soaps. If your skin becomes reddened/irritated stop using the CHG.  Do not shave (including legs and underarms) for at least 48 hours prior to first CHG shower. It is OK to shave your face.  Please follow these instructions carefully.   1. Shower the NIGHT BEFORE SURGERY Tues and the MORNING OF SURGERY Wed  2. If you chose to wash your hair, wash your hair first as usual with your normal shampoo.  3. After you shampoo, rinse  your hair and body thoroughly to remove the shampoo.  4. Wash Face and genitals (private parts) with your normal soap.   5.  Shower the NIGHT BEFORE SURGERY and the MORNING OF SURGERY with CHG Soap.   6. Use CHG Soap as you would any other liquid soap. You can apply CHG directly to the skin and wash gently with a scrungie or a clean washcloth.   7. Apply the CHG Soap to your body ONLY FROM THE NECK DOWN.  Do not use on open wounds or open sores. Avoid contact with your eyes, ears, mouth and genitals (private parts). Wash Face and genitals (private parts)  with your normal soap.   8. Wash thoroughly, paying special attention to the area where your surgery will be performed.  9. Thoroughly rinse your body with warm water  from the neck down.  10. DO NOT shower/wash with your normal soap after using and rinsing off the CHG Soap.  11. Pat yourself dry with a CLEAN TOWEL.  12. Wear CLEAN PAJAMAS to bed the night before surgery  13. Place CLEAN SHEETS on your bed the night before your surgery  14. DO NOT SLEEP WITH PETS.   Day of Surgery: Wear Clean/Comfortable clothing the morning of surgery Do not apply any deodorants/lotions.   Remember to brush your teeth WITH YOUR REGULAR TOOTHPASTE.   Please read over the following fact sheets that you were given.

## 2020-09-18 ENCOUNTER — Encounter (HOSPITAL_COMMUNITY): Payer: Self-pay

## 2020-09-18 LAB — SARS CORONAVIRUS 2 (TAT 6-24 HRS): SARS Coronavirus 2: NEGATIVE

## 2020-09-18 MED ORDER — DEXTROSE 5 % IV SOLN
3.0000 g | INTRAVENOUS | Status: DC
  Filled 2020-09-18: qty 3000

## 2020-09-18 NOTE — Anesthesia Preprocedure Evaluation (Deleted)
Anesthesia Evaluation    Airway        Dental   Pulmonary former smoker,           Cardiovascular hypertension,      Neuro/Psych    GI/Hepatic   Endo/Other    Renal/GU      Musculoskeletal   Abdominal   Peds  Hematology   Anesthesia Other Findings   Reproductive/Obstetrics                             Anesthesia Physical Anesthesia Plan  ASA:   Anesthesia Plan:    Post-op Pain Management:    Induction:   PONV Risk Score and Plan:   Airway Management Planned:   Additional Equipment:   Intra-op Plan:   Post-operative Plan:   Informed Consent:   Plan Discussed with:   Anesthesia Plan Comments: (PAT note written 09/18/2020 by Shonna Chock, PA-C. )        Anesthesia Quick Evaluation
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
## 2020-09-19 ENCOUNTER — Inpatient Hospital Stay (HOSPITAL_COMMUNITY): Payer: Managed Care, Other (non HMO) | Admitting: Vascular Surgery

## 2020-09-19 ENCOUNTER — Inpatient Hospital Stay (HOSPITAL_COMMUNITY): Payer: Managed Care, Other (non HMO) | Admitting: Anesthesiology

## 2020-09-19 ENCOUNTER — Inpatient Hospital Stay: Payer: Self-pay

## 2020-09-19 ENCOUNTER — Encounter (HOSPITAL_COMMUNITY): Payer: Self-pay | Admitting: Orthopedic Surgery

## 2020-09-19 ENCOUNTER — Other Ambulatory Visit: Payer: Self-pay

## 2020-09-19 ENCOUNTER — Inpatient Hospital Stay (HOSPITAL_COMMUNITY)
Admission: RE | Admit: 2020-09-19 | Discharge: 2020-09-24 | DRG: 467 | Disposition: A | Discharge status: Home Health | Payer: Managed Care, Other (non HMO) | Attending: Orthopedic Surgery | Admitting: Orthopedic Surgery

## 2020-09-19 ENCOUNTER — Encounter (HOSPITAL_COMMUNITY)
Admission: RE | Disposition: A | Discharge status: Home Health | Payer: Self-pay | Source: Home / Self Care | Attending: Orthopedic Surgery

## 2020-09-19 DIAGNOSIS — B951 Streptococcus, group B, as the cause of diseases classified elsewhere: Secondary | ICD-10-CM | POA: Diagnosis present

## 2020-09-19 DIAGNOSIS — I10 Essential (primary) hypertension: Secondary | ICD-10-CM | POA: Diagnosis present

## 2020-09-19 DIAGNOSIS — Z87891 Personal history of nicotine dependence: Secondary | ICD-10-CM | POA: Diagnosis not present

## 2020-09-19 DIAGNOSIS — T8459XA Infection and inflammatory reaction due to other internal joint prosthesis, initial encounter: Secondary | ICD-10-CM | POA: Diagnosis not present

## 2020-09-19 DIAGNOSIS — T8454XA Infection and inflammatory reaction due to internal left knee prosthesis, initial encounter: Principal | ICD-10-CM | POA: Diagnosis present

## 2020-09-19 DIAGNOSIS — M869 Osteomyelitis, unspecified: Secondary | ICD-10-CM | POA: Diagnosis present

## 2020-09-19 DIAGNOSIS — Z96651 Presence of right artificial knee joint: Secondary | ICD-10-CM | POA: Diagnosis present

## 2020-09-19 DIAGNOSIS — T8459XS Infection and inflammatory reaction due to other internal joint prosthesis, sequela: Principal | ICD-10-CM

## 2020-09-19 DIAGNOSIS — T847XXA Infection and inflammatory reaction due to other internal orthopedic prosthetic devices, implants and grafts, initial encounter: Secondary | ICD-10-CM

## 2020-09-19 DIAGNOSIS — Y831 Surgical operation with implant of artificial internal device as the cause of abnormal reaction of the patient, or of later complication, without mention of misadventure at the time of the procedure: Secondary | ICD-10-CM | POA: Diagnosis present

## 2020-09-19 DIAGNOSIS — Z96652 Presence of left artificial knee joint: Secondary | ICD-10-CM | POA: Diagnosis not present

## 2020-09-19 DIAGNOSIS — Z20822 Contact with and (suspected) exposure to covid-19: Secondary | ICD-10-CM | POA: Diagnosis present

## 2020-09-19 DIAGNOSIS — Z6841 Body Mass Index (BMI) 40.0 and over, adult: Secondary | ICD-10-CM | POA: Diagnosis not present

## 2020-09-19 DIAGNOSIS — T847XXS Infection and inflammatory reaction due to other internal orthopedic prosthetic devices, implants and grafts, sequela: Secondary | ICD-10-CM

## 2020-09-19 DIAGNOSIS — T8459XD Infection and inflammatory reaction due to other internal joint prosthesis, subsequent encounter: Secondary | ICD-10-CM

## 2020-09-19 DIAGNOSIS — Z96659 Presence of unspecified artificial knee joint: Secondary | ICD-10-CM

## 2020-09-19 HISTORY — PX: EXCISIONAL TOTAL KNEE ARTHROPLASTY WITH ANTIBIOTIC SPACERS: SHX5827

## 2020-09-19 LAB — HEMOGLOBIN AND HEMATOCRIT, BLOOD
HCT: 39.9 % (ref 39.0–52.0)
Hemoglobin: 12.3 g/dL — ABNORMAL LOW (ref 13.0–17.0)

## 2020-09-19 SURGERY — REMOVAL, TOTAL ARTHROPLASTY HARDWARE, KNEE, WITH ANTIBIOTIC SPACER INSERTION
Anesthesia: General | Site: Knee | Laterality: Left

## 2020-09-19 MED ORDER — FENTANYL CITRATE (PF) 250 MCG/5ML IJ SOLN
INTRAMUSCULAR | Status: DC | PRN
  Administered 2020-09-19 (×2): 50 ug via INTRAVENOUS
  Administered 2020-09-19: 150 ug via INTRAVENOUS

## 2020-09-19 MED ORDER — MIDAZOLAM HCL 2 MG/2ML IJ SOLN
INTRAMUSCULAR | Status: DC | PRN
  Administered 2020-09-19: 2 mg via INTRAVENOUS

## 2020-09-19 MED ORDER — FUROSEMIDE 20 MG PO TABS: 20.0000 mg | ORAL_TABLET | Freq: Every day | ORAL | Status: DC | PRN

## 2020-09-19 MED ORDER — LOSARTAN POTASSIUM 50 MG PO TABS
100.0000 mg | ORAL_TABLET | Freq: Every day | ORAL | Status: DC
  Administered 2020-09-19 – 2020-09-24 (×6): 100 mg via ORAL
  Filled 2020-09-19 (×6): qty 2

## 2020-09-19 MED ORDER — HYDROMORPHONE HCL 1 MG/ML IJ SOLN
0.5000 mg | INTRAMUSCULAR | Status: DC | PRN
  Administered 2020-09-21 – 2020-09-24 (×13): 1 mg via INTRAVENOUS
  Filled 2020-09-19 (×13): qty 1

## 2020-09-19 MED ORDER — ONDANSETRON HCL 4 MG/2ML IJ SOLN
INTRAMUSCULAR | Status: AC
  Filled 2020-09-19: qty 4

## 2020-09-19 MED ORDER — PROPOFOL 10 MG/ML IV BOLUS
INTRAVENOUS | Status: DC | PRN
  Administered 2020-09-19: 200 mg via INTRAVENOUS

## 2020-09-19 MED ORDER — ACETAMINOPHEN 325 MG PO TABS
325.0000 mg | ORAL_TABLET | Freq: Four times a day (QID) | ORAL | Status: DC | PRN
  Administered 2020-09-20 (×2): 650 mg via ORAL
  Filled 2020-09-19 (×2): qty 2

## 2020-09-19 MED ORDER — CHLORHEXIDINE GLUCONATE 0.12 % MT SOLN
OROMUCOSAL | Status: AC
  Administered 2020-09-19: 15 mL
  Filled 2020-09-19: qty 15

## 2020-09-19 MED ORDER — PIPERACILLIN-TAZOBACTAM 3.375 G IVPB
3.3750 g | Freq: Three times a day (TID) | INTRAVENOUS | Status: DC
  Administered 2020-09-19 – 2020-09-20 (×4): 3.375 g via INTRAVENOUS
  Filled 2020-09-19 (×4): qty 50

## 2020-09-19 MED ORDER — ONDANSETRON HCL 4 MG/2ML IJ SOLN
INTRAMUSCULAR | Status: DC | PRN
  Administered 2020-09-19: 4 mg via INTRAVENOUS

## 2020-09-19 MED ORDER — BUPIVACAINE-EPINEPHRINE (PF) 0.5% -1:200000 IJ SOLN
INTRAMUSCULAR | Status: DC | PRN
  Administered 2020-09-19: 20 mL via PERINEURAL

## 2020-09-19 MED ORDER — OXYCODONE HCL 5 MG PO TABS
10.0000 mg | ORAL_TABLET | ORAL | Status: DC | PRN
  Administered 2020-09-19 (×2): 15 mg via ORAL
  Administered 2020-09-21: 10 mg via ORAL
  Administered 2020-09-22 – 2020-09-24 (×7): 15 mg via ORAL
  Filled 2020-09-19 (×11): qty 3

## 2020-09-19 MED ORDER — METOCLOPRAMIDE HCL 5 MG PO TABS: 5.0000 mg | ORAL_TABLET | Freq: Three times a day (TID) | ORAL | Status: DC | PRN

## 2020-09-19 MED ORDER — VANCOMYCIN HCL 1000 MG IV SOLR
INTRAVENOUS | Status: AC
  Filled 2020-09-19: qty 2000

## 2020-09-19 MED ORDER — MIDAZOLAM HCL 2 MG/2ML IJ SOLN
INTRAMUSCULAR | Status: AC
  Filled 2020-09-19: qty 2

## 2020-09-19 MED ORDER — HYDROCHLOROTHIAZIDE 25 MG PO TABS
25.0000 mg | ORAL_TABLET | Freq: Every day | ORAL | Status: DC
  Administered 2020-09-19 – 2020-09-24 (×6): 25 mg via ORAL
  Filled 2020-09-19 (×6): qty 1

## 2020-09-19 MED ORDER — TRANEXAMIC ACID-NACL 1000-0.7 MG/100ML-% IV SOLN
INTRAVENOUS | Status: AC
  Filled 2020-09-19: qty 100

## 2020-09-19 MED ORDER — BISOPROLOL FUMARATE 10 MG PO TABS
10.0000 mg | ORAL_TABLET | Freq: Every day | ORAL | Status: DC
  Administered 2020-09-19 – 2020-09-23 (×5): 10 mg via ORAL
  Filled 2020-09-19 (×7): qty 1

## 2020-09-19 MED ORDER — 0.9 % SODIUM CHLORIDE (POUR BTL) OPTIME
TOPICAL | Status: DC | PRN
  Administered 2020-09-19: 1000 mL

## 2020-09-19 MED ORDER — DEXAMETHASONE SODIUM PHOSPHATE 10 MG/ML IJ SOLN
INTRAMUSCULAR | Status: AC
  Filled 2020-09-19: qty 2

## 2020-09-19 MED ORDER — ASPIRIN EC 325 MG PO TBEC
325.0000 mg | DELAYED_RELEASE_TABLET | Freq: Every day | ORAL | Status: DC
  Administered 2020-09-19 – 2020-09-24 (×6): 325 mg via ORAL
  Filled 2020-09-19 (×6): qty 1

## 2020-09-19 MED ORDER — AMISULPRIDE (ANTIEMETIC) 5 MG/2ML IV SOLN: 10.0000 mg | Freq: Once | INTRAVENOUS | Status: DC | PRN

## 2020-09-19 MED ORDER — DOCUSATE SODIUM 100 MG PO CAPS
100.0000 mg | ORAL_CAPSULE | Freq: Two times a day (BID) | ORAL | Status: DC
  Administered 2020-09-19 – 2020-09-23 (×8): 100 mg via ORAL
  Filled 2020-09-19 (×10): qty 1

## 2020-09-19 MED ORDER — MIDAZOLAM HCL 2 MG/2ML IJ SOLN: 2.0000 mg | Freq: Once | INTRAMUSCULAR | Status: AC

## 2020-09-19 MED ORDER — METOCLOPRAMIDE HCL 5 MG/ML IJ SOLN: 5.0000 mg | Freq: Three times a day (TID) | INTRAMUSCULAR | Status: DC | PRN

## 2020-09-19 MED ORDER — PHENYLEPHRINE HCL-NACL 10-0.9 MG/250ML-% IV SOLN
INTRAVENOUS | Status: DC | PRN
  Administered 2020-09-19: 30 ug/min via INTRAVENOUS

## 2020-09-19 MED ORDER — PROPOFOL 10 MG/ML IV BOLUS
INTRAVENOUS | Status: AC
  Filled 2020-09-19: qty 40

## 2020-09-19 MED ORDER — MIDAZOLAM HCL 2 MG/2ML IJ SOLN
INTRAMUSCULAR | Status: AC
  Administered 2020-09-19: 2 mg via INTRAVENOUS
  Filled 2020-09-19: qty 2

## 2020-09-19 MED ORDER — GLYCOPYRROLATE PF 0.2 MG/ML IJ SOSY
PREFILLED_SYRINGE | INTRAMUSCULAR | Status: AC
  Filled 2020-09-19: qty 1

## 2020-09-19 MED ORDER — DEXAMETHASONE SODIUM PHOSPHATE 10 MG/ML IJ SOLN
INTRAMUSCULAR | Status: DC | PRN
  Administered 2020-09-19: 10 mg via INTRAVENOUS

## 2020-09-19 MED ORDER — FENTANYL CITRATE (PF) 100 MCG/2ML IJ SOLN
25.0000 ug | INTRAMUSCULAR | Status: DC | PRN
  Administered 2020-09-19 (×4): 25 ug via INTRAVENOUS

## 2020-09-19 MED ORDER — METHOCARBAMOL 1000 MG/10ML IJ SOLN
500.0000 mg | Freq: Four times a day (QID) | INTRAVENOUS | Status: DC | PRN
  Filled 2020-09-19: qty 5

## 2020-09-19 MED ORDER — VANCOMYCIN HCL 1000 MG IV SOLR
INTRAVENOUS | Status: DC | PRN
  Administered 2020-09-19 (×3): 1000 mg

## 2020-09-19 MED ORDER — DEXAMETHASONE SODIUM PHOSPHATE 10 MG/ML IJ SOLN
INTRAMUSCULAR | Status: DC | PRN
  Administered 2020-09-19: 10 mg

## 2020-09-19 MED ORDER — ACETAMINOPHEN 500 MG PO TABS
1000.0000 mg | ORAL_TABLET | Freq: Once | ORAL | Status: DC
  Filled 2020-09-19: qty 2

## 2020-09-19 MED ORDER — TRANEXAMIC ACID 1000 MG/10ML IV SOLN
2000.0000 mg | INTRAVENOUS | Status: AC
  Administered 2020-09-19: 2000 mg via TOPICAL
  Filled 2020-09-19: qty 20

## 2020-09-19 MED ORDER — VANCOMYCIN HCL 1750 MG/350ML IV SOLN
1750.0000 mg | Freq: Two times a day (BID) | INTRAVENOUS | Status: DC
  Administered 2020-09-20: 1750 mg via INTRAVENOUS
  Filled 2020-09-19 (×2): qty 350

## 2020-09-19 MED ORDER — DEXTROSE 5 % IV SOLN
INTRAVENOUS | Status: DC | PRN
  Administered 2020-09-19: 3 g via INTRAVENOUS

## 2020-09-19 MED ORDER — TOPIRAMATE 25 MG PO TABS
50.0000 mg | ORAL_TABLET | Freq: Two times a day (BID) | ORAL | Status: DC
  Administered 2020-09-19 – 2020-09-23 (×9): 50 mg via ORAL
  Filled 2020-09-19 (×10): qty 2

## 2020-09-19 MED ORDER — FENTANYL CITRATE (PF) 250 MCG/5ML IJ SOLN
INTRAMUSCULAR | Status: AC
  Filled 2020-09-19: qty 5

## 2020-09-19 MED ORDER — FENTANYL CITRATE (PF) 100 MCG/2ML IJ SOLN
INTRAMUSCULAR | Status: AC
  Administered 2020-09-19: 100 ug via INTRAVENOUS
  Filled 2020-09-19: qty 2

## 2020-09-19 MED ORDER — HYDROMORPHONE HCL 1 MG/ML IJ SOLN
INTRAMUSCULAR | Status: AC
  Filled 2020-09-19: qty 0.5

## 2020-09-19 MED ORDER — GLYCOPYRROLATE 0.2 MG/ML IJ SOLN
INTRAMUSCULAR | Status: DC | PRN
  Administered 2020-09-19: .2 mg via INTRAVENOUS

## 2020-09-19 MED ORDER — METHOCARBAMOL 500 MG PO TABS
500.0000 mg | ORAL_TABLET | Freq: Four times a day (QID) | ORAL | Status: DC | PRN
  Administered 2020-09-19 – 2020-09-24 (×13): 500 mg via ORAL
  Filled 2020-09-19 (×15): qty 1

## 2020-09-19 MED ORDER — LACTATED RINGERS IV SOLN: INTRAVENOUS | Status: DC

## 2020-09-19 MED ORDER — VANCOMYCIN HCL 10 G IV SOLR
2500.0000 mg | Freq: Once | INTRAVENOUS | Status: AC
  Administered 2020-09-19: 2500 mg via INTRAVENOUS
  Filled 2020-09-19: qty 2500

## 2020-09-19 MED ORDER — ALBUMIN HUMAN 5 % IV SOLN: INTRAVENOUS | Status: DC | PRN

## 2020-09-19 MED ORDER — TRANEXAMIC ACID-NACL 1000-0.7 MG/100ML-% IV SOLN
INTRAVENOUS | Status: DC | PRN
  Administered 2020-09-19: 1000 mg via INTRAVENOUS

## 2020-09-19 MED ORDER — FENTANYL CITRATE (PF) 100 MCG/2ML IJ SOLN
INTRAMUSCULAR | Status: AC
  Filled 2020-09-19: qty 2

## 2020-09-19 MED ORDER — HYDROMORPHONE HCL 1 MG/ML IJ SOLN
INTRAMUSCULAR | Status: DC | PRN
  Administered 2020-09-19 (×2): .5 mg via INTRAVENOUS

## 2020-09-19 MED ORDER — OXYCODONE HCL 5 MG PO TABS
5.0000 mg | ORAL_TABLET | ORAL | Status: DC | PRN
  Administered 2020-09-20 – 2020-09-22 (×5): 10 mg via ORAL
  Filled 2020-09-19 (×6): qty 2

## 2020-09-19 MED ORDER — FENTANYL CITRATE (PF) 100 MCG/2ML IJ SOLN
100.0000 ug | Freq: Once | INTRAMUSCULAR | Status: AC
Start: 2020-09-19 — End: 2020-09-19

## 2020-09-19 MED ORDER — PRAVASTATIN SODIUM 40 MG PO TABS
20.0000 mg | ORAL_TABLET | Freq: Every day | ORAL | Status: DC
  Administered 2020-09-19 – 2020-09-23 (×5): 20 mg via ORAL
  Filled 2020-09-19 (×5): qty 1

## 2020-09-19 MED ORDER — ONDANSETRON HCL 4 MG PO TABS: 4.0000 mg | ORAL_TABLET | Freq: Four times a day (QID) | ORAL | Status: DC | PRN

## 2020-09-19 MED ORDER — SODIUM CHLORIDE 0.9 % IV SOLN: INTRAVENOUS | Status: DC

## 2020-09-19 MED ORDER — PHENYLEPHRINE 40 MCG/ML (10ML) SYRINGE FOR IV PUSH (FOR BLOOD PRESSURE SUPPORT)
PREFILLED_SYRINGE | INTRAVENOUS | Status: DC | PRN
  Administered 2020-09-19: 80 ug via INTRAVENOUS
  Administered 2020-09-19: 40 ug via INTRAVENOUS

## 2020-09-19 MED ORDER — SODIUM CHLORIDE 0.9 % IR SOLN
Status: DC | PRN
  Administered 2020-09-19: 3000 mL

## 2020-09-19 MED ORDER — CELECOXIB 200 MG PO CAPS
200.0000 mg | ORAL_CAPSULE | Freq: Once | ORAL | Status: AC
  Administered 2020-09-19: 200 mg via ORAL
  Filled 2020-09-19: qty 1

## 2020-09-19 MED ORDER — ONDANSETRON HCL 4 MG/2ML IJ SOLN: 4.0000 mg | Freq: Four times a day (QID) | INTRAMUSCULAR | Status: DC | PRN

## 2020-09-19 SURGICAL SUPPLY — 55 items
BLADE SAGITTAL 25.0X1.19X90 (BLADE) ×2 IMPLANT
BLADE SAW SGTL 13X75X1.27 (BLADE) ×2 IMPLANT
BLADE SAW SGTL 81X20 HD (BLADE) ×2 IMPLANT
BLADE SURG 21 STRL SS (BLADE) ×4 IMPLANT
BNDG COHESIVE 6X5 TAN STRL LF (GAUZE/BANDAGES/DRESSINGS) ×4 IMPLANT
BNDG GAUZE ELAST 4 BULKY (GAUZE/BANDAGES/DRESSINGS) ×2 IMPLANT
BOWL SMART MIX CTS (DISPOSABLE) ×2 IMPLANT
CEMENT BONE R 1X40 (Cement) ×8 IMPLANT
COOLER ICEMAN CLASSIC (MISCELLANEOUS) ×2 IMPLANT
COVER SURGICAL LIGHT HANDLE (MISCELLANEOUS) ×2 IMPLANT
COVER WAND RF STERILE (DRAPES) ×2 IMPLANT
CUFF TOURN SGL QUICK 34 (TOURNIQUET CUFF) ×1
CUFF TOURN SGL QUICK 42 (TOURNIQUET CUFF) IMPLANT
CUFF TRNQT CYL 34X4.125X (TOURNIQUET CUFF) ×1 IMPLANT
DRAPE DERMATAC (DRAPES) ×4 IMPLANT
DRAPE EXTREMITY T 121X128X90 (DISPOSABLE) ×2 IMPLANT
DRAPE HALF SHEET 40X57 (DRAPES) ×4 IMPLANT
DRAPE INCISE IOBAN 66X45 STRL (DRAPES) ×2 IMPLANT
DRAPE U-SHAPE 47X51 STRL (DRAPES) ×2 IMPLANT
DRSG ADAPTIC 3X8 NADH LF (GAUZE/BANDAGES/DRESSINGS) ×2 IMPLANT
DRSG PAD ABDOMINAL 8X10 ST (GAUZE/BANDAGES/DRESSINGS) ×2 IMPLANT
DURAPREP 26ML APPLICATOR (WOUND CARE) ×2 IMPLANT
ELECT REM PT RETURN 9FT ADLT (ELECTROSURGICAL) ×2
ELECTRODE REM PT RTRN 9FT ADLT (ELECTROSURGICAL) ×1 IMPLANT
FACESHIELD WRAPAROUND (MASK) ×2 IMPLANT
GAUZE SPONGE 4X4 12PLY STRL (GAUZE/BANDAGES/DRESSINGS) ×2 IMPLANT
GLOVE BIOGEL PI IND STRL 9 (GLOVE) ×1 IMPLANT
GLOVE BIOGEL PI INDICATOR 9 (GLOVE) ×1
GLOVE SURG ORTHO 9.0 STRL STRW (GLOVE) ×2 IMPLANT
GOWN STRL REUS W/ TWL XL LVL3 (GOWN DISPOSABLE) ×2 IMPLANT
GOWN STRL REUS W/TWL XL LVL3 (GOWN DISPOSABLE) ×2
HANDPIECE INTERPULSE COAX TIP (DISPOSABLE) ×1
IMMOBILIZER KNEE 24 THIGH 36 (MISCELLANEOUS) ×1 IMPLANT
IMMOBILIZER KNEE 24 UNIV (MISCELLANEOUS) ×2
KIT BASIN OR (CUSTOM PROCEDURE TRAY) ×2 IMPLANT
KIT TURNOVER KIT B (KITS) ×2 IMPLANT
MANIFOLD NEPTUNE II (INSTRUMENTS) ×2 IMPLANT
NS IRRIG 1000ML POUR BTL (IV SOLUTION) ×2 IMPLANT
PACK TOTAL JOINT (CUSTOM PROCEDURE TRAY) ×2 IMPLANT
PAD ARMBOARD 7.5X6 YLW CONV (MISCELLANEOUS) ×2 IMPLANT
PAD COLD SHLDR WRAP-ON (PAD) ×2 IMPLANT
SET HNDPC FAN SPRY TIP SCT (DISPOSABLE) ×1 IMPLANT
SPACER INTERSPACE KNEE XL 2 (Spacer) ×2 IMPLANT
STAPLER VISISTAT 35W (STAPLE) ×2 IMPLANT
SUCTION FRAZIER HANDLE 10FR (MISCELLANEOUS)
SUCTION TUBE FRAZIER 10FR DISP (MISCELLANEOUS) IMPLANT
SUT ETHILON 2 0 PSLX (SUTURE) ×8 IMPLANT
SUT PDS AB 1 CT1 36 (SUTURE) ×2 IMPLANT
SUT VIC AB 0 CT1 27 (SUTURE) ×1
SUT VIC AB 0 CT1 27XBRD ANBCTR (SUTURE) ×1 IMPLANT
SUT VIC AB 1 CTX 36 (SUTURE)
SUT VIC AB 1 CTX36XBRD ANBCTR (SUTURE) IMPLANT
TOWEL GREEN STERILE (TOWEL DISPOSABLE) ×2 IMPLANT
TOWEL GREEN STERILE FF (TOWEL DISPOSABLE) ×2 IMPLANT
TUBE CONNECTING 12X1/4 (SUCTIONS) ×2 IMPLANT
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
## 2020-09-19 NOTE — Anesthesia Preprocedure Evaluation (Addendum)
Anesthesia Evaluation  Patient identified by MRN, date of birth, ID band Patient awake    Reviewed: Allergy & Precautions, NPO status , Patient's Chart, lab work & pertinent test results  History of Anesthesia Complications (+) PONV and history of anesthetic complications  Airway Mallampati: III  TM Distance: >3 FB Neck ROM: Full    Dental no notable dental hx. (+) Dental Advisory Given   Pulmonary former smoker,    Pulmonary exam normal        Cardiovascular hypertension, Pt. on medications Normal cardiovascular exam     Neuro/Psych negative psych ROS   GI/Hepatic negative GI ROS, Neg liver ROS,   Endo/Other  Morbid obesity  Renal/GU   negative genitourinary   Musculoskeletal  (+) Arthritis , Osteoarthritis,    Abdominal (+) + obese,   Peds  Hematology negative hematology ROS (+)   Anesthesia Other Findings   Reproductive/Obstetrics                                                              Anesthesia Evaluation  Patient identified by MRN, date of birth, ID band Patient awake    Reviewed: Allergy & Precautions, NPO status , Patient's Chart, lab work & pertinent test results, reviewed documented beta blocker date and time   History of Anesthesia Complications (+) PONV  Airway Mallampati: III  TM Distance: >3 FB Neck ROM: Full    Dental  (+) Teeth Intact, Dental Advisory Given   Pulmonary sleep apnea , former smoker,    breath sounds clear to auscultation       Cardiovascular hypertension, Pt. on medications and Pt. on home beta blockers + dysrhythmias Atrial Fibrillation  Rhythm:Regular Rate:Normal     Neuro/Psych negative neurological ROS  negative psych ROS   GI/Hepatic negative GI ROS, Neg liver ROS,   Endo/Other  negative endocrine ROS  Renal/GU      Musculoskeletal negative musculoskeletal ROS (+)   Abdominal (+) + obese,   Peds   Hematology negative hematology ROS (+)   Anesthesia Other Findings   Reproductive/Obstetrics                           Lab Results  Component Value Date   WBC 6.5 09/17/2020   HGB 14.7 09/17/2020   HCT 46.3 09/17/2020   MCV 86.1 09/17/2020   PLT 244 09/17/2020   Lab Results  Component Value Date   CREATININE 1.07 09/17/2020   BUN 16 09/17/2020   NA 139 09/17/2020   K 3.9 09/17/2020   CL 104 09/17/2020   CO2 28 09/17/2020   Lab Results  Component Value Date   INR 1.07 05/30/2017   INR 1.78 05/15/2017   INR 1.62 05/14/2017   Echo (12/01/2016):  Normal LV systolic function, EF 60-65%, no segmental wall motion abnormalities   Anesthesia Physical Anesthesia Plan  ASA: III  Anesthesia Plan: General   Post-op Pain Management: GA combined w/ Regional for post-op pain   Induction: Intravenous  PONV Risk Score and Plan: 3 and Ondansetron, Dexamethasone and Midazolam  Airway Management Planned: LMA  Additional Equipment: None  Intra-op Plan:   Post-operative Plan: Extubation in OR  Informed Consent: I have reviewed the patients History and Physical, chart, labs and discussed the  procedure including the risks, benefits and alternatives for the proposed anesthesia with the patient or authorized representative who has indicated his/her understanding and acceptance.   Dental advisory given  Plan Discussed with: CRNA  Anesthesia Plan Comments: (See PAT note 07/07/2018 by Antionette Poles, PA-C )      Anesthesia Quick Evaluation                                   Anesthesia Evaluation  Patient identified by MRN, date of birth, ID band Patient awake    Reviewed: Allergy & Precautions, NPO status , Patient's Chart, lab work & pertinent test results, reviewed documented beta blocker date and time   History of Anesthesia Complications (+) PONV  Airway Mallampati: III  TM Distance: >3 FB Neck ROM: Full    Dental  (+) Teeth Intact,  Dental Advisory Given   Pulmonary sleep apnea , former smoker,    breath sounds clear to auscultation       Cardiovascular hypertension, Pt. on medications and Pt. on home beta blockers + dysrhythmias Atrial Fibrillation  Rhythm:Regular Rate:Normal     Neuro/Psych negative neurological ROS  negative psych ROS   GI/Hepatic negative GI ROS, Neg liver ROS,   Endo/Other  negative endocrine ROS  Renal/GU      Musculoskeletal negative musculoskeletal ROS (+)   Abdominal (+) + obese,   Peds  Hematology negative hematology ROS (+)   Anesthesia Other Findings   Reproductive/Obstetrics                           Lab Results  Component Value Date   WBC 6.5 09/17/2020   HGB 14.7 09/17/2020   HCT 46.3 09/17/2020   MCV 86.1 09/17/2020   PLT 244 09/17/2020   Lab Results  Component Value Date   CREATININE 1.07 09/17/2020   BUN 16 09/17/2020   NA 139 09/17/2020   K 3.9 09/17/2020   CL 104 09/17/2020   CO2 28 09/17/2020   Lab Results  Component Value Date   INR 1.07 05/30/2017   INR 1.78 05/15/2017   INR 1.62 05/14/2017   Echo (12/01/2016):  Normal LV systolic function, EF 60-65%, no segmental wall motion abnormalities   Anesthesia Physical Anesthesia Plan  ASA: III  Anesthesia Plan: General   Post-op Pain Management: GA combined w/ Regional for post-op pain   Induction: Intravenous  PONV Risk Score and Plan: 3 and Ondansetron, Dexamethasone and Midazolam  Airway Management Planned: LMA  Additional Equipment: None  Intra-op Plan:   Post-operative Plan: Extubation in OR  Informed Consent: I have reviewed the patients History and Physical, chart, labs and discussed the procedure including the risks, benefits and alternatives for the proposed anesthesia with the patient or authorized representative who has indicated his/her understanding and acceptance.   Dental advisory given  Plan Discussed with: CRNA  Anesthesia Plan  Comments: (See PAT note 07/07/2018 by Antionette Poles, PA-C )      Anesthesia Quick Evaluation  Anesthesia Physical  Anesthesia Plan  ASA: III  Anesthesia Plan: General   Post-op Pain Management:  Regional for Post-op pain   Induction:   PONV Risk Score and Plan: 3 and Ondansetron, Dexamethasone and Midazolam  Airway Management Planned: LMA  Additional Equipment:   Intra-op Plan:   Post-operative Plan: Extubation in OR  Informed Consent: I have reviewed the patients History and Physical, chart, labs  and discussed the procedure including the risks, benefits and alternatives for the proposed anesthesia with the patient or authorized representative who has indicated his/her understanding and acceptance.     Dental advisory given  Plan Discussed with: Anesthesiologist and CRNA  Anesthesia Plan Comments:        Anesthesia Quick Evaluation

## 2020-09-19 NOTE — Transfer of Care (Signed)
Immediate Anesthesia Transfer of Care Note  Patient: Bill [name]  Procedure(s) Performed: REMOVE LEFT TOTAL KNEE ARTHROPLASTY, PLACE ANATOMIC ANTIBIOTIC SPACERS (Left Knee)  Patient Location: PACU  Anesthesia Type:General and GA combined with regional for post-op pain  Level of Consciousness: drowsy and patient cooperative  Airway & Oxygen Therapy: Patient Spontanous Breathing and Patient connected to nasal cannula oxygen  Post-op Assessment: Report given to RN, Post -op Vital signs reviewed and stable and Patient moving all extremities X 4  Post vital signs: Reviewed and stable  Last Vitals:  Vitals Value Taken Time  BP 107/62 09/19/20 1202  Temp    Pulse 58 09/19/20 1202  Resp 13 09/19/20 1202  SpO2 98 % 09/19/20 1202  Vitals shown include unvalidated device data.  Last Pain:  Vitals:   09/19/20 0915  TempSrc:   PainSc: 0-No pain      Patients Stated Pain Goal: 2 (09/19/20 0905)  Complications: No complications documented.

## 2020-09-19 NOTE — Anesthesia Postprocedure Evaluation (Signed)
Anesthesia Post Note  Patient: Bill [name]  Procedure(s) Performed: REMOVE LEFT TOTAL KNEE ARTHROPLASTY, PLACE ANATOMIC ANTIBIOTIC SPACERS (Left Knee)     Patient location during evaluation: PACU Anesthesia Type: General Level of consciousness: sedated Pain management: pain level controlled Vital Signs Assessment: post-procedure vital signs reviewed and stable Respiratory status: spontaneous breathing and respiratory function stable Cardiovascular status: stable Postop Assessment: no apparent nausea or vomiting Anesthetic complications: no   No complications documented.  Last Vitals:  Vitals:   09/19/20 1315 09/19/20 1330  BP: 114/68 101/60  Pulse: (!) 57 63  Resp: 11 15  Temp:  36.7 C  SpO2: 98% 94%    Last Pain:  Vitals:   09/19/20 1300  TempSrc:   PainSc: 9                  Roshawnda Pecora,Borden DANIEL

## 2020-09-19 NOTE — Progress Notes (Signed)
Pharmacy Antibiotic Note  Bill [name] is a 55 y.o. male admitted on 09/19/2020 with infected left total knee arthroplasty. Pharmacy has been consulted for Vancomycin and Zosyn dosing for wound infection of left total knee replacement.  He is s/p removal of left total knee arthroplasty, placement of anatomic antibiotic spacers on 09/19/20.  Cultures sent.  Plan: Vancomycin 2500 mg IV x 1 loading dose then Vancomycin  1750 mg IV Q 12 hrs. Goal AUC 400-550. Expected AUC: 482 SCr used: 1.07 Zosyn 3.375 g IV q8hr (infuse each dose over 4 hrs) Monitor clinical progress, renal function. Check steady state vancomycin peak and trough per protocol if needed.     Height: 6\' 1"  (185.4 cm) Weight: (!) 185.1 kg (408 lb) IBW/kg (Calculated) : 79.9  Temp (24hrs), Avg:98.2 F (36.8 C), Min:98 F (36.7 C), Max:98.6 F (37 C)  Recent Labs  Lab 09/17/20 1328 09/17/20 1333  WBC 6.5  --   CREATININE  --  1.07    Estimated Creatinine Clearance: 136.2 mL/min (by C-G formula based on SCr of 1.07 mg/dL).    No Known Allergies  Antimicrobials this admission: Vanc 2/23>> Zosyn 2/23>>  Dose adjustments this admission: n/a  Microbiology results: 2/23  Wound Cx (L knee tissue): pending 2/21 surgical PCR:  MRSA PCR: neg, SA : postitive  Thank you for allowing pharmacy to be a part of this patient's care.  3/21, RPh Clinical Pharmacist 09/19/2020 4:00 PM

## 2020-09-19 NOTE — Anesthesia Procedure Notes (Signed)
Anesthesia Regional Block: Femoral nerve block   Pre-Anesthetic Checklist: ,, timeout performed, Correct Patient, Correct Site, Correct Laterality, Correct Procedure, Correct Position, site marked, Risks and benefits discussed,  Surgical consent,  Pre-op evaluation,  At surgeon's request and post-op pain management  Laterality: Left  Prep: chloraprep       Needles:  Injection technique: Single-shot  Needle Type: Echogenic Stimulator Needle     Needle Length: 5cm  Needle Gauge: 22     Additional Needles:   Procedures:, nerve stimulator,,, ultrasound used (permanent image in chart),,,,   Nerve Stimulator or Paresthesia:  Response: quadraceps contraction, 0.45 mA,   Additional Responses:   Narrative:  Start time: 09/19/2020 9:04 AM End time: 09/19/2020 9:14 AM Injection made incrementally with aspirations every 5 mL.  Performed by: Personally  Anesthesiologist: Heather Roberts, MD  Additional Notes: Functioning IV was confirmed and monitors were applied.  A 32mm 22ga Arrow echogenic stimulator needle was used. Sterile prep and drape,hand hygiene and sterile gloves were used. Ultrasound guidance: relevant anatomy identified, needle position confirmed, local anesthetic spread visualized around nerve(s)., vascular puncture avoided.  Image printed for medical record. Negative aspiration and negative test dose prior to incremental administration of local anesthetic. The patient tolerated the procedure well.

## 2020-09-19 NOTE — Op Note (Signed)
09/19/2020  12:02 PM  PATIENT:  Bill [name]    PRE-OPERATIVE DIAGNOSIS:  Infected Left Total Knee Arthroplasty  POST-OPERATIVE DIAGNOSIS:  Same  PROCEDURE:  REMOVE LEFT TOTAL KNEE ARTHROPLASTY, PLACE ANATOMIC ANTIBIOTIC SPACERS  SURGEON:  [name], MD  PHYSICIAN ASSISTANT:None ANESTHESIA:   General  PREOPERATIVE INDICATIONS:  Bill [name] is a  55 y.o. male with a diagnosis of Infected Left Total Knee Arthroplasty who failed conservative measures and elected for surgical management.    The risks benefits and alternatives were discussed with the patient preoperatively including but not limited to the risks of infection, bleeding, nerve injury, cardiopulmonary complications, the need for revision surgery, among others, and the patient was willing to proceed.  OPERATIVE IMPLANTS: Extra-large Biomet total knee antibiotic spacer cemented with 3 bags of antibiotic cement with 3 g of vancomycin  @ENCIMAGES @  OPERATIVE FINDINGS: Patient had a septic knee tissue bone and fluid was sent for cultures.  OPERATIVE PROCEDURE: Patient was brought the operating room and underwent a general anesthetic.  After adequate levels anesthesia were obtained patient's left lower extremity was prepped using DuraPrep draped into a sterile field Ioban was used to cover all exposed skin.  A timeout was called.  A midline incision was made through his previous incision this was carried down to a medial retinacular incision.  There was cloudy fluid within the joint this was sent for cultures there was inflamed synovial lining and extensive synovectomy was performed and this was also sent for cultures.  Using a oscillating saw the patella was removed the bone was debrided back to healthy viable bone.  Attention was then focused on the tibia and femoral components.  A oscillating saw was used beneath the hardware to loosen it from the cement mantle.  The femoral and tibial components were removed  without complications.  All remaining cement in the tibial canal and on the femoral and tibial residual bone was resected.  The wound was irrigated with pulsatile lavage patient received IV and topical TXA after extensive synovectomy and further irrigation the soft tissue margins were clear.  Tissue was sent for cultures.  The premade tibial and femoral antibiotic components were cemented in place with the knee extended size extra-large components.  The knee was further soaked in TXA.  The retinaculum was closed with #1 PDS the skin was closed using 2-0 nylon a Prevena wound VAC was applied this had a good suction fit patient was placed in a knee immobilizer and extubated taken the PACU in stable condition   DISCHARGE PLANNING:  Antibiotic duration: Anticipate continue IV antibiotics for 6 weeks infectious diseases consulted  Weightbearing: Touchdown weightbearing on the left with a knee immobilizer  Pain medication: Opioid pathway  Dressing care/ Wound VAC: Continue wound VAC for 1 week  Ambulatory devices: Walker  Discharge to: Anticipate discharge to home  Follow-up: In the office 1 week post operative.

## 2020-09-19 NOTE — Anesthesia Procedure Notes (Signed)
Procedure Name: LMA Insertion Date/Time: 09/19/2020 10:22 AM Performed by: Alease Medina, CRNA Pre-anesthesia Checklist: Patient identified, Emergency Drugs available, Suction available and Patient being monitored Patient Re-evaluated:Patient Re-evaluated prior to induction Oxygen Delivery Method: Circle system utilized Preoxygenation: Pre-oxygenation with 100% oxygen Induction Type: IV induction Ventilation: Mask ventilation without difficulty LMA: LMA with gastric port inserted LMA Size: 5.0 Number of attempts: 1 Placement Confirmation: positive ETCO2,  breath sounds checked- equal and bilateral and CO2 detector Tube secured with: Tape Dental Injury: Teeth and Oropharynx as per pre-operative assessment

## 2020-09-19 NOTE — Progress Notes (Signed)
Pt's bracelets still in place. Pt states he cannot remove them and has never had them removed for any kind of surgery. Risks involved in keeping bracelets in place explained to patient- pt still refused.

## 2020-09-19 NOTE — Interval H&P Note (Signed)
History and Physical Interval Note:  09/19/2020 10:00 AM  Bill [name]  has presented today for surgery, with the diagnosis of Infected Left Total Knee Arthroplasty.  The various methods of treatment have been discussed with the patient and family. After consideration of risks, benefits and other options for treatment, the patient has consented to  Procedure(s): REMOVE LEFT TOTAL KNEE ARTHROPLASTY, PLACE ANATOMIC ANTIBIOTIC SPACERS (Left) as a surgical intervention.  The patient's history has been reviewed, patient examined, no change in status, stable for surgery.  I have reviewed the patient's chart and labs.  Questions were answered to the patient's satisfaction.     [name]

## 2020-09-20 ENCOUNTER — Encounter (HOSPITAL_COMMUNITY): Payer: Self-pay | Admitting: Orthopedic Surgery

## 2020-09-20 DIAGNOSIS — Z96652 Presence of left artificial knee joint: Secondary | ICD-10-CM | POA: Diagnosis not present

## 2020-09-20 DIAGNOSIS — T8459XA Infection and inflammatory reaction due to other internal joint prosthesis, initial encounter: Secondary | ICD-10-CM | POA: Diagnosis not present

## 2020-09-20 LAB — CBC
HCT: 32.4 % — ABNORMAL LOW (ref 39.0–52.0)
Hemoglobin: 10.6 g/dL — ABNORMAL LOW (ref 13.0–17.0)
MCH: 27.7 pg (ref 26.0–34.0)
MCHC: 32.7 g/dL (ref 30.0–36.0)
MCV: 84.6 fL (ref 80.0–100.0)
Platelets: 226 10*3/uL (ref 150–400)
RBC: 3.83 MIL/uL — ABNORMAL LOW (ref 4.22–5.81)
RDW: 14 % (ref 11.5–15.5)
WBC: 9.4 10*3/uL (ref 4.0–10.5)
nRBC: 0 % (ref 0.0–0.2)

## 2020-09-20 LAB — ACID FAST SMEAR (AFB, MYCOBACTERIA): Acid Fast Smear: NEGATIVE

## 2020-09-20 MED ORDER — SODIUM CHLORIDE 0.9% FLUSH
10.0000 mL | Freq: Two times a day (BID) | INTRAVENOUS | Status: DC
  Administered 2020-09-20: 10 mL
  Administered 2020-09-20: 20 mL
  Administered 2020-09-23: 10 mL
  Administered 2020-09-23: 40 mL

## 2020-09-20 MED ORDER — SODIUM CHLORIDE 0.9 % IV SOLN
1000.0000 mg | Freq: Every day | INTRAVENOUS | Status: DC
  Administered 2020-09-20 – 2020-09-23 (×4): 1000 mg via INTRAVENOUS
  Filled 2020-09-20 (×6): qty 20

## 2020-09-20 MED ORDER — CHLORHEXIDINE GLUCONATE CLOTH 2 % EX PADS
6.0000 | MEDICATED_PAD | Freq: Every day | CUTANEOUS | Status: DC
  Administered 2020-09-20 – 2020-09-23 (×4): 6 via TOPICAL

## 2020-09-20 MED ORDER — OXYCODONE HCL 10 MG PO TABS
10.0000 mg | ORAL_TABLET | ORAL | 0 refills | Status: DC | PRN
Start: 2020-09-20 — End: 2020-09-24

## 2020-09-20 MED ORDER — SODIUM CHLORIDE 0.9% FLUSH: 10.0000 mL | INTRAVENOUS | Status: DC | PRN

## 2020-09-20 MED ORDER — SODIUM CHLORIDE 0.9 % IV SOLN
2.0000 g | INTRAVENOUS | Status: DC
  Administered 2020-09-20 – 2020-09-23 (×4): 2 g via INTRAVENOUS
  Filled 2020-09-20 (×4): qty 20

## 2020-09-20 NOTE — Progress Notes (Signed)
Peripherally Inserted Central Catheter Placement  The IV Nurse has discussed with the patient and/or persons authorized to consent for the patient, the purpose of this procedure and the potential benefits and risks involved with this procedure.  The benefits include less needle sticks, lab draws from the catheter, and the patient may be discharged home with the catheter. Risks include, but not limited to, infection, bleeding, blood clot (thrombus formation), and puncture of an artery; nerve damage and irregular heartbeat and possibility to perform a PICC exchange if needed/ordered by physician.  Alternatives to this procedure were also discussed.  Bard Power PICC patient education guide, fact sheet on infection prevention and patient information card has been provided to patient /or left at bedside.    PICC Placement Documentation  PICC Single Lumen 09/20/20 PICC Right Cephalic 46 cm 0 cm (Active)  Indication for Insertion or Continuance of Line Home intravenous therapies (PICC only) 09/20/20 0900  Exposed Catheter (cm) 0 cm 09/20/20 0900  Site Assessment Clean;Intact;Dry 09/20/20 0900  Line Status Flushed;Blood return noted 09/20/20 0900  Dressing Type Transparent;Securing device 09/20/20 0900  Dressing Status Clean;Dry;Intact 09/20/20 0900  Antimicrobial disc in place? Yes 09/20/20 0900  Safety Lock Not Applicable 09/20/20 0900  Line Care Connections checked and tightened 09/20/20 0900  Line Adjustment (NICU/IV Team Only) No 09/20/20 0900  Dressing Intervention New dressing 09/20/20 0900  Dressing Change Due 09/28/20 09/20/20 0900       Franne Grip Renee 09/20/2020, 9:57 AM

## 2020-09-20 NOTE — Evaluation (Signed)
Physical Therapy Evaluation Patient Details Name: Bill [name] MRN: [id number] DOB: 11/17/1965 Today's Date: 09/20/2020   History of Present Illness  55 y.o. M, Dx of infected left TKA, L TKA removed and replaced with anatomic antibiotic spacers  Clinical Impression  Pt presented to PT with deficits in functional mobility, balance, ambulation, and understanding of WB status. Pt requires assistance with mobility due to weakness to reduce falls risk. Due to impaired adherence to precautions pt remains at a high risk for poor healing of L LE.  Pt will continue to benefit from skilled PT to improve strength and gait quality while reinforcing WB precautions. Next session pt will benefit from transfer training and wheelchair training to improve household mobility. PT recommends bariatric wheelchair and bariatric 3 in 1 commode at the time of d/c. If pt remains unable to maintain WB precautions, may need to consider inpatient PT services at the time of d/c.     Follow Up Recommendations Home health PT;Other (comment) (may need to consider inpatient therapies at d/c is pt remains unable to abide by WB precautions)    Equipment Recommendations  Other (comment) (bariatric wheelchair, bariatric bedside commode)    Recommendations for Other Services       Precautions / Restrictions Precautions Precautions: Fall Required Braces or Orthoses: Knee Immobilizer - Left Knee Immobilizer - Left: On at all times Restrictions Weight Bearing Restrictions: Yes LLE Weight Bearing: Touchdown weight bearing      Mobility  Bed Mobility Overal bed mobility: Needs Assistance Bed Mobility: Sit to Supine;Supine to Sit     Supine to sit: Min assist;HOB elevated Sit to supine: Min assist;HOB elevated        Transfers Overall transfer level: Needs assistance Equipment used: Rolling walker (2 wheeled) Transfers: Sit to/from Stand Sit to Stand: Min guard (pt appear to be breaking WB precautions  through L leg, requires PT supervision and cuing)            Ambulation/Gait Ambulation/Gait assistance: Min guard (pt appears to be breaking WB precautions through L LE, requires PT supervision and cuing) Gait Distance (Feet): 15 Feet Assistive device: Rolling walker (2 wheeled) Gait Pattern/deviations: Step-to pattern;Decreased stance time - left;Decreased weight shift to left   Gait velocity interpretation: <1.31 ft/sec, indicative of household ambulator    Stairs            Wheelchair Mobility    Modified Rankin (Stroke Patients Only)       Balance Overall balance assessment: Needs assistance Sitting-balance support: Feet supported;No upper extremity supported Sitting balance-Leahy Scale: Good     Standing balance support: Bilateral upper extremity supported Standing balance-Leahy Scale: Poor Standing balance comment:  (requires B UE support, walker, and min guard)                             Pertinent Vitals/Pain Pain Assessment: 0-10 Pain Score: 5  (3/10 at best, 8/10 at worst) Pain Location: L LE, centralized at knee Pain Descriptors / Indicators: Aching;Sore Pain Intervention(s): Monitored during session;Limited activity within patient's tolerance    Home Living Family/patient expects to be discharged to:: Private residence Living Arrangements: Spouse/significant other Available Help at Discharge: Family;Available 24 hours/day Type of Home: House Home Access: Stairs to enter Entrance Stairs-Rails: Doctor, general practice of Steps: 3 Home Layout: Two level;Able to live on main level with bedroom/bathroom (pt. states there is a basement level he does not use) Home Equipment: Walker - 2 wheels;Cane -  single point;Grab bars - toilet;Grab bars - tub/shower      Prior Function Level of Independence: Independent with assistive device(s)   Gait / Transfers Assistance Needed: ambulates limited household distances with assistive  device           Hand Dominance        Extremity/Trunk Assessment   Upper Extremity Assessment Upper Extremity Assessment: Overall WFL for tasks assessed    Lower Extremity Assessment Lower Extremity Assessment: LLE deficits/detail LLE Deficits / Details: generalized weakness/movement intolerance possibly secondary to pain LLE: Unable to fully assess due to immobilization;Unable to fully assess due to pain    Cervical / Trunk Assessment Cervical / Trunk Assessment: Other exceptions Cervical / Trunk Exceptions: morbidly obese  Communication   Communication: No difficulties  Cognition Arousal/Alertness: Awake/alert Behavior During Therapy: WFL for tasks assessed/performed Overall Cognitive Status: Within Functional Limits for tasks assessed                                        General Comments General comments (skin integrity, edema, etc.): VSS on RA    Exercises General Exercises - Upper Extremity Chair Push Up: AROM;Strengthening;Both;Seated General Exercises - Lower Extremity Ankle Circles/Pumps: AROM;Both Straight Leg Raises: Both;Strengthening;AROM;Supine;Seated Other Exercises Other Exercises: rows with use of bed rails   Assessment/Plan    PT Assessment Patient needs continued PT services  PT Problem List Decreased strength;Decreased activity tolerance;Decreased mobility;Decreased range of motion;Decreased knowledge of precautions;Decreased balance;Decreased knowledge of use of DME;Decreased safety awareness;Pain       PT Treatment Interventions Gait training;DME instruction;Balance training;Stair training;Functional mobility training;Wheelchair mobility training;Patient/family education;Therapeutic activities;Therapeutic exercise    PT Goals (Current goals can be found in the Care Plan section)  Acute Rehab PT Goals Patient Stated Goal: to return home PT Goal Formulation: With patient Time For Goal Achievement: 10/04/20 Potential to  Achieve Goals: Good    Frequency Min 5X/week   Barriers to discharge        Co-evaluation               AM-PAC PT "6 Clicks" Mobility  Outcome Measure Help needed turning from your back to your side while in a flat bed without using bedrails?: A Little Help needed moving from lying on your back to sitting on the side of a flat bed without using bedrails?: A Little Help needed moving to and from a bed to a chair (including a wheelchair)?: A Little Help needed standing up from a chair using your arms (e.g., wheelchair or bedside chair)?: A Little Help needed to walk in hospital room?: A Little Help needed climbing 3-5 steps with a railing? : A Lot 6 Click Score: 17    End of Session Equipment Utilized During Treatment: Left knee immobilizer Activity Tolerance: Patient limited by pain Patient left: in bed;with bed alarm set;with call bell/phone within reach;with family/visitor present (wife arrived at end of session) Nurse Communication: Mobility status;Weight bearing status PT Visit Diagnosis: Unsteadiness on feet (R26.81);Other abnormalities of gait and mobility (R26.89);Muscle weakness (generalized) (M62.81);Difficulty in walking, not elsewhere classified (R26.2);Pain Pain - Right/Left: Left Pain - part of body: Knee;Leg    Time: 1133-1220 PT Time Calculation (min) (ACUTE ONLY): 47 min   Charges:   PT Evaluation $PT Eval Moderate Complexity: 1 Mod PT Treatments $Therapeutic Activity: 8-22 mins        Dyke Maes, SPT Pager: [phone]  Ilona Sorrel  09/20/2020, 2:58 PM
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
## 2020-09-20 NOTE — Progress Notes (Signed)
Patient ID: Bill [name], male   DOB: 1965/09/30, 55 y.o.   MRN: [id number] Patient is postoperative day 1 removal left total knee arthroplasty and placement of a anatomic antibiotic spacer cemented in place with additional cement had 3 g of vancomycin.  Cultures are negative at this time.  There was a large effusion that was cloudy there was extensive synovial inflammatory response and the prosthesis was easy to remove.  The bone had good quality.  Patient currently on Vanc and Zosyn.  Plan for PICC line today, infectious disease consulted for long-term antibiotic treatment.  Anticipate reimplantation of a total knee in about 4 months pending recommendations from infectious disease.

## 2020-09-20 NOTE — Evaluation (Signed)
Occupational Therapy Evaluation Patient Details Name: Bill [name] MRN: [id number] DOB: 02/03/1966 Today's Date: 09/20/2020    History of Present Illness 55 y.o. M, Dx of infected left TKA, L TKA removed and replaced with anatomic antibiotic spacers   Clinical Impression   Pt in bed upon arrival. Eval limited due to pt declining any EOB/OOB activity due to anticipatory pain, but agreeable to bed level activity. Pt lives at home with his wife and was Ind with ADLs/selfcare and has cane and RW for mobility. Pt now requires assist with LB selfcare and mobility. Pt would benefit from acute OT services to address impairments to maximize level of function and safety    Follow Up Recommendations  Home health OT    Equipment Recommendations  Other (comment) (bariatric w/c, bariatric BSC)    Recommendations for Other Services       Precautions / Restrictions Precautions Precautions: Fall Required Braces or Orthoses: Knee Immobilizer - Left Knee Immobilizer - Left: On at all times Restrictions Weight Bearing Restrictions: Yes LLE Weight Bearing: Touchdown weight bearing      Mobility Bed Mobility               General bed mobility comments: pt declined any mobility due to not wanting pain to "flare up". Per PT note pt min A with sitting EOB and back to supine    Transfers Overall transfer level: Needs assistance               General transfer comment: pt declined, per PT note pt min guard A using RW. Pt states that he already walked with PT to the doorway today    Balance                                           ADL either performed or assessed with clinical judgement   ADL Overall ADL's : Needs assistance/impaired Eating/Feeding: Independent;Sitting;Bed level   Grooming: Wash/dry hands;Wash/dry face;Set up   Upper Body Bathing: Supervision/ safety;Set up;Bed level   Lower Body Bathing: Maximal assistance;Bed level   Upper Body  Dressing : Set up;Bed level   Lower Body Dressing: Total assistance     Toilet Transfer Details (indicate cue type and reason): pt declined           General ADL Comments: pt agreeable to bed level activity     Vision Baseline Vision/History: Wears glasses Wears Glasses: At all times Patient Visual Report: No change from baseline       Perception     Praxis      Pertinent Vitals/Pain Pain Assessment: 0-10 Pain Score: 3  Pain Location: L LE, centralized at knee Pain Descriptors / Indicators: Aching;Sore Pain Intervention(s): Limited activity within patient's tolerance;Monitored during session     Hand Dominance Right   Extremity/Trunk Assessment Upper Extremity Assessment Upper Extremity Assessment: Overall WFL for tasks assessed   Lower Extremity Assessment Lower Extremity Assessment: Defer to PT evaluation   Cervical / Trunk Assessment Cervical / Trunk Assessment: Other exceptions Cervical / Trunk Exceptions: morbidly obese   Communication Communication Communication: No difficulties   Cognition Arousal/Alertness: Awake/alert Behavior During Therapy: WFL for tasks assessed/performed Overall Cognitive Status: Within Functional Limits for tasks assessed  General Comments       Exercises     Shoulder Instructions      Home Living Family/patient expects to be discharged to:: Private residence Living Arrangements: Spouse/significant other Available Help at Discharge: Family;Available 24 hours/day Type of Home: House Home Access: Stairs to enter [employer] of Steps: 3 Entrance Stairs-Rails: Right;Left Home Layout: Two level;Able to live on main level with bedroom/bathroom     Bathroom Shower/Tub: Producer, television/film/video: Handicapped height Bathroom Accessibility: Yes   Home Equipment: Environmental consultant - 2 wheels;Cane - single point;Grab bars - toilet;Grab bars - tub/shower;Hand held  shower head          Prior Functioning/Environment Level of Independence: Independent with assistive device(s)  Gait / Transfers Assistance Needed: ambulates limited household distances with assistive device, Ind with selfcare              OT Problem List: Decreased strength;Pain;Decreased activity tolerance      OT Treatment/Interventions: Self-care/ADL training;DME and/or AE instruction;Therapeutic activities;Balance training;Patient/family education    OT Goals(Current goals can be found in the care plan section) Acute Rehab OT Goals Patient Stated Goal: to return home OT Goal Formulation: With patient/family Time For Goal Achievement: 10/04/20 Potential to Achieve Goals: Good ADL Goals Pt Will Perform Grooming: with min guard assist;with supervision;with set-up;with caregiver independent in assisting;sitting;standing Pt Will Perform Upper Body Bathing: with set-up;sitting;with caregiver independent in assisting Pt Will Perform Lower Body Bathing: with mod assist;with min assist;sitting/lateral leans;with caregiver independent in assisting Pt Will Perform Upper Body Dressing: with set-up;sitting;with caregiver independent in assisting Pt Will Perform Lower Body Dressing: with max assist;with mod assist;with caregiver independent in assisting;sitting/lateral leans;sit to/from stand Pt Will Transfer to Toilet: with min guard assist;with supervision;with modified independence;ambulating Pt Will Perform Toileting - Clothing Manipulation and hygiene: with min assist;with min guard assist;with supervision;sit to/from stand;with caregiver independent in assisting  OT Frequency: Min 2X/week   Barriers to D/C:  none          Co-evaluation              AM-PAC OT "6 Clicks" Daily Activity     Outcome Measure Help from another person eating meals?: None Help from another person taking care of personal grooming?: None Help from another person toileting, which includes using  toliet, bedpan, or urinal?: A Lot Help from another person bathing (including washing, rinsing, drying)?: A Lot Help from another person to put on and taking off regular upper body clothing?: None Help from another person to put on and taking off regular lower body clothing?: Total 6 Click Score: 17   End of Session    Activity Tolerance: Other (comment) (pt limited by anticipatory pain) Patient left: in bed  OT Visit Diagnosis: Other abnormalities of gait and mobility (R26.89);Pain Pain - Right/Left: Left Pain - part of body: Knee;Leg                Time: 7654-6503 OT Time Calculation (min): 20 min Charges:  OT General Charges $OT Visit: 1 Visit OT Evaluation $OT Eval Moderate Complexity: 1 Mod    Galen Manila 09/20/2020, 5:05 PM

## 2020-09-20 NOTE — Progress Notes (Signed)
Spoke with nurse regarding BMP order from this AM. Consult was not received to draw lab. Instructed nurse to discuss with MD and place VAST consult if lab needs to be drawn. VU. Tomasita Morrow, RN VAST

## 2020-09-20 NOTE — Plan of Care (Signed)

## 2020-09-20 NOTE — Progress Notes (Addendum)
PHARMACY CONSULT NOTE FOR:  OUTPATIENT  PARENTERAL ANTIBIOTIC THERAPY (OPAT)  Indication: PJI of left TKA  Regimen: Penicillin G 24 million units every 24 hours as a continuous infusion  End date: 10/31/20   IV antibiotic discharge orders are pended. To discharging provider:  please sign these orders via discharge navigator,  Select New Orders & click on the button choice - Manage This Unsigned Work.     Thank you for allowing pharmacy to be a part of this patient's care.  Sharin Mons, PharmD, BCPS, BCIDP Infectious Diseases Clinical Pharmacist Phone: [phone] 09/20/2020, 3:10 PM

## 2020-09-21 ENCOUNTER — Encounter: Payer: Self-pay | Admitting: Adult Health

## 2020-09-21 DIAGNOSIS — T8459XA Infection and inflammatory reaction due to other internal joint prosthesis, initial encounter: Secondary | ICD-10-CM | POA: Diagnosis not present

## 2020-09-21 DIAGNOSIS — B951 Streptococcus, group B, as the cause of diseases classified elsewhere: Secondary | ICD-10-CM | POA: Diagnosis not present

## 2020-09-21 LAB — BASIC METABOLIC PANEL
Anion gap: 13 (ref 5–15)
BUN: 22 mg/dL — ABNORMAL HIGH (ref 6–20)
CO2: 24 mmol/L (ref 22–32)
Calcium: 8.6 mg/dL — ABNORMAL LOW (ref 8.9–10.3)
Chloride: 102 mmol/L (ref 98–111)
Creatinine, Ser: 1.16 mg/dL (ref 0.61–1.24)
GFR, Estimated: >60 mL/min (ref >60)
Glucose, Bld: 129 mg/dL — ABNORMAL HIGH (ref 70–99)
Potassium: 3.1 mmol/L — ABNORMAL LOW (ref 3.5–5.1)
Sodium: 139 mmol/L (ref 135–145)

## 2020-09-21 LAB — CBC
HCT: 28.9 % — ABNORMAL LOW (ref 39.0–52.0)
Hemoglobin: 9.4 g/dL — ABNORMAL LOW (ref 13.0–17.0)
MCH: 27.8 pg (ref 26.0–34.0)
MCHC: 32.5 g/dL (ref 30.0–36.0)
MCV: 85.5 fL (ref 80.0–100.0)
Platelets: 185 10*3/uL (ref 150–400)
RBC: 3.38 MIL/uL — ABNORMAL LOW (ref 4.22–5.81)
RDW: 14.4 % (ref 11.5–15.5)
WBC: 7.9 10*3/uL (ref 4.0–10.5)
nRBC: 0 % (ref 0.0–0.2)

## 2020-09-21 LAB — CK: Total CK: 38 U/L — ABNORMAL LOW (ref 49–397)

## 2020-09-21 LAB — SEDIMENTATION RATE: Sed Rate: 34 mm/hr — ABNORMAL HIGH (ref 0–16)

## 2020-09-21 LAB — C-REACTIVE PROTEIN: CRP: 3.5 mg/dL — ABNORMAL HIGH (ref <1.0)

## 2020-09-21 NOTE — Progress Notes (Signed)
Diagnosis: PJI   Culture Result: Group B strep agalactiae  No Known Allergies  OPAT Orders Discharge antibiotics to be given via PICC line Discharge antibiotics: daptomycin and ceftriaxone Per pharmacy protocol  Duration: 6 weeks  End Date: 10/31/20  [facility] Care Per Protocol:  Home health RN for IV administration and teaching; PICC line care and labs.    Labs weekly while on IV antibiotics: X__ CBC with differential __ BMP X__ CMP X__ CRP X__ ESR __ Vancomycin trough X_ CK  __ Please pull PIC at completion of IV antibiotics X__ Please leave PIC in place until doctor has seen patient or been notified  Fax weekly labs to [phone]  Clinic Follow Up Appt:10/03/20  '@1' : 45 pm

## 2020-09-21 NOTE — Progress Notes (Signed)
Physical Therapy Treatment Patient Details Name: Bill [name] MRN: [id number] DOB: May 08, 1966 Today's Date: 09/21/2020    History of Present Illness 55 y.o. M, Dx of infected left TKA, L TKA removed and replaced with anatomic antibiotic spacers    PT Comments    Patient progressing slowly towards PT goals. Continues to be limited by pain, weakness and decreased activity tolerance. Requires Min guard assist for all mobility with use of RW, however pt with difficulty maintaining TDWB during mobility despite cues. Discussed placing chair halfway to bathroom in case pt fatigues and needs to sit and rest. Encouraged ankle pumps and quad sets especially with LLE. Pt reports he will be likely sleeping in his recliner at home. Will continue to follow and progress as tolerated.   Follow Up Recommendations  Home health PT;Supervision for mobility/OOB     Equipment Recommendations  Other (comment) (bari w/c, bari BSC)    Recommendations for Other Services       Precautions / Restrictions Precautions Precautions: Other (comment);Fall Precaution Comments: wound vac Required Braces or Orthoses: Knee Immobilizer - Left Knee Immobilizer - Left: On at all times Restrictions Weight Bearing Restrictions: Yes LLE Weight Bearing: Touchdown weight bearing    Mobility  Bed Mobility Overal bed mobility: Needs Assistance Bed Mobility: Sit to Supine       Sit to supine: Min assist;HOB elevated   General bed mobility comments: Assist to bring LLE into bed to return to supine.    Transfers Overall transfer level: Needs assistance Equipment used: Rolling walker (2 wheeled) Transfers: Sit to/from Stand Sit to Stand: Min guard         General transfer comment: Min guard for safety, likley placing more than TDWB through LLE during transitions. Stood from toilet x1.  Ambulation/Gait Ambulation/Gait assistance: Min guard Gait Distance (Feet): 20 Feet Assistive device: Rolling  walker (2 wheeled) Gait Pattern/deviations: Step-to pattern;Decreased stance time - left;Decreased weight shift to left;Trunk flexed Gait velocity: decreased Gait velocity interpretation: <1.31 ft/sec, indicative of household ambulator General Gait Details: Slow, unsteady gait with difficulty maintaining TDWB through LLE during gait; fatigues. Limited endurance/tolerance secondary to pain/weakness.   Stairs             Wheelchair Mobility    Modified Rankin (Stroke Patients Only)       Balance Overall balance assessment: Needs assistance Sitting-balance support: Feet supported;No upper extremity supported Sitting balance-Leahy Scale: Good     Standing balance support: During functional activity Standing balance-Leahy Scale: Poor Standing balance comment: Requires UE support on RW. Able to remove hands from RW to grab wipes and wash hands however notified pt this is placing more than TDWB through LLE.                            Cognition Arousal/Alertness: Awake/alert Behavior During Therapy: WFL for tasks assessed/performed Overall Cognitive Status: Within Functional Limits for tasks assessed                                        Exercises General Exercises - Lower Extremity Quad Sets: AROM;Left;5 reps;Supine    General Comments        Pertinent Vitals/Pain Pain Assessment: Faces Faces Pain Scale: Hurts even more Pain Location: L LE Pain Descriptors / Indicators: Aching;Sore Pain Intervention(s): Monitored during session;Repositioned;Limited activity within patient's tolerance;Patient requesting pain meds-RN notified  Home Living                      Prior Function            PT Goals (current goals can now be found in the care plan section) Progress towards PT goals: Progressing toward goals    Frequency    Min 5X/week      PT Plan Current plan remains appropriate    Co-evaluation               AM-PAC PT "6 Clicks" Mobility   Outcome Measure  Help needed turning from your back to your side while in a flat bed without using bedrails?: A Little Help needed moving from lying on your back to sitting on the side of a flat bed without using bedrails?: A Little Help needed moving to and from a bed to a chair (including a wheelchair)?: A Little Help needed standing up from a chair using your arms (e.g., wheelchair or bedside chair)?: A Little Help needed to walk in hospital room?: A Little Help needed climbing 3-5 steps with a railing? : A Lot 6 Click Score: 17    End of Session Equipment Utilized During Treatment: Left knee immobilizer Activity Tolerance: Patient limited by pain;Patient limited by fatigue Patient left: in bed;with call bell/phone within reach;with bed alarm set Nurse Communication: Mobility status;Patient requests pain meds PT Visit Diagnosis: Unsteadiness on feet (R26.81);Other abnormalities of gait and mobility (R26.89);Muscle weakness (generalized) (M62.81);Difficulty in walking, not elsewhere classified (R26.2);Pain Pain - Right/Left: Left Pain - part of body: Knee;Leg     Time: 5009-3818 PT Time Calculation (min) (ACUTE ONLY): 21 min  Charges:  $Therapeutic Activity: 8-22 mins                     Vale Haven, PT, DPT Acute Rehabilitation Services Pager [phone] Office [phone]       Blake Divine A Lanier Ensign 09/21/2020, 12:19 PM

## 2020-09-21 NOTE — Progress Notes (Signed)
Occupational Therapy Treatment Patient Details Name: Bill [name] MRN: [id number] DOB: 03/31/1966 Today's Date: 09/21/2020    History of present illness 55 y.o. M, Dx of infected left TKA, L TKA removed and replaced with anatomic antibiotic spacers   OT comments  Pt in bed upon arrival, agreeable to OOB activity to bathroom. Pt able to don shorts seated at EOB/standing to pull up over hips min guard A. Pt required cues for TDWB of L LE. OT will continue to follow acutely to maximize level of function and safety  Follow Up Recommendations  Home health OT    Equipment Recommendations  Other (comment) (bariatric w/c, bariatric BSC)    Recommendations for Other Services      Precautions / Restrictions Precautions Precautions: Other (comment);Fall Precaution Comments: wound vac Required Braces or Orthoses: Knee Immobilizer - Left Knee Immobilizer - Left: On at all times Restrictions Weight Bearing Restrictions: Yes LLE Weight Bearing: Touchdown weight bearing       Mobility Bed Mobility Overal bed mobility: Needs Assistance Bed Mobility: Supine to Sit     Supine to sit: Min guard;HOB elevated Sit to supine: Min assist;HOB elevated   General bed mobility comments: Assist to bring LLE into bed to return to supine.    Transfers Overall transfer level: Needs assistance Equipment used: Rolling walker (2 wheeled) Transfers: Sit to/from Stand Sit to Stand: Min guard         General transfer comment: Min guard for safety, likley placing more than TDWB through LLE during transitions    Balance Overall balance assessment: Needs assistance Sitting-balance support: Feet supported;No upper extremity supported Sitting balance-Leahy Scale: Good     Standing balance support: During functional activity;Bilateral upper extremity supported Standing balance-Leahy Scale: Poor Standing balance comment: Requires UE support on RW. Able to remove hands from RW to grab wipes  and wash hands however notified pt this is placing more than TDWB through LLE.                           ADL either performed or assessed with clinical judgement   ADL Overall ADL's : Needs assistance/impaired     Grooming: Wash/dry hands;Wash/dry face;Sitting   Upper Body Bathing: Set up;Sitting           Lower Body Dressing: Min guard;Supervision/safety;Sitting/lateral leans;Sit to/from stand Lower Body Dressing Details (indicate cue type and reason): pt donned shorts Toilet Transfer: Min guard;Ambulation;RW;Regular Toilet;Grab bars   Toileting- Clothing Manipulation and Hygiene: Supervision/safety       Functional mobility during ADLs: Min guard;Rolling walker       Vision Baseline Vision/History: Wears glasses Wears Glasses: At all times Patient Visual Report: No change from baseline     Perception     Praxis      Cognition Arousal/Alertness: Awake/alert Behavior During Therapy: WFL for tasks assessed/performed Overall Cognitive Status: Within Functional Limits for tasks assessed                                          Exercises General Exercises - Lower Extremity Quad Sets: AROM;Left;5 reps;Supine   Shoulder Instructions       General Comments      Pertinent Vitals/ Pain       Pain Assessment: Faces Faces Pain Scale: Hurts little more Pain Location: L LE Pain Descriptors / Indicators: Aching;Sore Pain Intervention(s): Monitored  during session;Limited activity within patient's tolerance;Repositioned;Patient requesting pain meds-RN notified  Home Living                                          Prior Functioning/Environment              Frequency  Min 2X/week        Progress Toward Goals  OT Goals(current goals can now be found in the care plan section)  Progress towards OT goals: Progressing toward goals  Acute Rehab OT Goals Patient Stated Goal: to return home  Plan Discharge plan  remains appropriate    Co-evaluation                 AM-PAC OT "6 Clicks" Daily Activity     Outcome Measure   Help from another person eating meals?: None Help from another person taking care of personal grooming?: None Help from another person toileting, which includes using toliet, bedpan, or urinal?: A Little Help from another person bathing (including washing, rinsing, drying)?: A Little Help from another person to put on and taking off regular upper body clothing?: None Help from another person to put on and taking off regular lower body clothing?: A Little 6 Click Score: 21    End of Session Equipment Utilized During Treatment: Rolling walker  OT Visit Diagnosis: Other abnormalities of gait and mobility (R26.89);Pain Pain - Right/Left: Left Pain - part of body: Knee;Leg   Activity Tolerance Patient tolerated treatment well   Patient Left Other (comment) (in bathroom with PT present)   Nurse Communication          Time: 8032-1224 OT Time Calculation (min): 18 min  Charges: OT General Charges $OT Visit: 1 Visit OT Treatments $Self Care/Home Management : 8-22 mins     Galen Manila 09/21/2020, 12:41 PM
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
## 2020-09-21 NOTE — Progress Notes (Signed)
Patient ID: Bill [name], male   DOB: Jun 28, 1966, 55 y.o.   MRN: [id number] Patient is postoperative day 2 removal of infected total knee arthroplasty and placement of an anatomic antibiotic spacer.  Patient's sensitivities are available his PICC line has been placed patient states he is good to be trained today for IV antibiotics at home.  Patient was able to ambulate to the door he is not safe for discharge to home at this time will evaluate for discharge this weekend.

## 2020-09-21 NOTE — TOC Initial Note (Signed)
Transition of Care [facility]) - Initial/Assessment Note    Patient Details  Name: Bill [name] MRN: [id number] Date of Birth: 06/11/1966  [facility] CM/SW Contact:    Epifanio Lesches, RN Phone Number: 09/21/2020, 2:43 PM  Clinical Narrative:                 Admitted with painful/ infected left total knee arthroplasty. Hx of left total knee in 2018 and s/p  right total knee arthroplasty. From home with wife.         - s/p  total knee arthroplasty revision, 2/23 NCM spoke with pt/wife regarding d/c planning. Pt will need LT IV ABX therapy for 6 weeks. Pt will also will need HHPT/OT services. Pt agreeable to home health services. Pt without preference as long as agency is in network. Referral made with [facility]/ Pam and accepted.  NCM spoke with [employer]/ [employer] / April CM who is assisting with obtaining [facility] provider and DME  ( [phone]. NCM shared with April pt's HH and DME needs ( HHRN,PT,OT and bariatric walker). Clinicals and orders faxed to [phone]. Case # [id number].  Pam/ [facility] to teach pt and wife how to administer IV antibiotic therapy @ bedside today.  TOC team will continue to monitor and assist with TOC needs.....  Expected Discharge Plan: Home w Home Health Services Barriers to Discharge: Continued Medical Work up   Patient Goals and CMS Choice     Choice offered to / list presented to : Patient  Expected Discharge Plan and Services Expected Discharge Plan: Home w Home Health Services   Discharge Planning Services: CM Consult                     DME Arranged: Other see comment (IV ABX therapy ( [facility]); [employer]/ [employer] assisting withlanding bariatric W/C) DME Agency: Other - Comment ([facility]) Date DME Agency Contacted: 09/20/20 Time DME Agency Contacted: 1600   HH Arranged: PT,RN,OT Risk analyst Home Health agency to provide RN) HH Agency:  ([facility]/ RN) Date HH Agency  Contacted: 09/20/20 Time HH Agency Contacted: [phone] ([employer] / [employer] assisting to land [employer] ( [phone], ext. [phone])    Prior Living Arrangements/Services   Lives with:: Spouse Patient language and need for interpreter reviewed:: Yes Do you feel safe going back to the place where you live?: Yes      Need for Family Participation in Patient Care: Yes (Comment) Care giver support system in place?: Yes (comment)   Criminal Activity/Legal Involvement Pertinent to Current Situation/Hospitalization: No - Comment as needed  Activities of Daily Living Home Assistive Devices/Equipment: Eyeglasses,Cane (specify quad or straight),Walker (specify type),CPAP,Hand-held shower hose,Grab bars in shower,Grab bars around toilet,Scales ADL Screening (condition at time of admission) Patient's cognitive ability adequate to safely complete daily activities?: Yes Is the patient deaf or have difficulty hearing?: No Does the patient have difficulty seeing, even when wearing glasses/contacts?: No Does the patient have difficulty concentrating, remembering, or making decisions?: No Patient able to express need for assistance with ADLs?: Yes Does the patient have difficulty dressing or bathing?: No Independently performs ADLs?: Yes (appropriate for developmental age) Does the patient have difficulty walking or climbing stairs?: Yes Weakness of Legs: Left Weakness of Arms/Hands: None  Permission Sought/Granted   Permission granted to share information with : Yes, Verbal Permission Granted              Emotional Assessment Appearance:: Appears stated age  Attitude/Demeanor/Rapport: Gracious Affect (typically observed): Pleasant Orientation: : Oriented to Self,Oriented to Place,Oriented to  Time,Oriented to Situation Alcohol / Substance Use: Not Applicable Psych Involvement: No (comment)  Admission diagnosis:  Hardware complicating wound infection (HCC) [T84.7XXA] Patient Active  Problem List   Diagnosis Date Noted   Hardware complicating wound infection (HCC) 09/19/2020   Infection of total knee replacement (HCC)    S/P total knee arthroplasty 01/05/2019   Unilateral primary osteoarthritis, right knee    Total knee replacement status, left 07/14/2018   Unilateral primary osteoarthritis, left knee    Morbid obesity (HCC) 06/22/2018   AKI (acute kidney injury) (HCC) 05/31/2017   Cellulitis of left leg 05/30/2017   HLD (hyperlipidemia) 05/30/2017   Back pain 05/30/2017   Hypertension    Aortic dissection (HCC)    Penetrating thigh wound 05/10/2017   Necrotizing fasciitis of pelvic region and thigh (HCC) 05/10/2017   Laceration of knee 05/08/2017   Open knee wound 05/08/2017   Penetrating injury of lower extremity    PCP:  Aggie Cosier, MD Pharmacy:   [facility] [phone] [name], [place] - [address] [place] [address] Phone: [phone] Fax: [phone]     Social Determinants of Health (SDOH) Interventions    Readmission Risk Interventions No flowsheet data found.

## 2020-09-21 NOTE — Progress Notes (Signed)
Brief Nutrition Note  RD responded to family request for diet education. Spoke with pt and wife at bedside. Pt's wife expresses concern that pt wants to "starve himself" in order to lose weight. Pt reports that he needs to lose another 100 lbs per his Cardiologist's recommendations. He is worried that he will not be able to lose weight now that his physical activity is limited.  Discussed importance of high-protein diet in wound healing and maintaining lean muscle mass. Emphasized the importance of serving sizes and provided examples of correct portions of common foods. Discussed importance of controlled and consistent intake throughout the day. Provided examples of ways to balance meals/snacks and encouraged intake of high-fiber, whole grain complex carbohydrates. Emphasized the importance of hydration with calorie-free beverages and limiting sugar-sweetened beverages. Encouraged pt to discuss physical activity options with physician. Teach back method used.  Expect good compliance.  Current diet order is Carb Modified. No meal completions charted at this time. Labs and medications reviewed. No further nutrition interventions warranted at this time. If additional nutrition issues arise, please re-consult RD.   Mertie Clause, MS, RD, LDN Inpatient Clinical Dietitian Please see AMiON for contact information.

## 2020-09-22 LAB — MAGNESIUM: Magnesium: 2 mg/dL (ref 1.7–2.4)

## 2020-09-22 LAB — POTASSIUM: Potassium: 3.2 mmol/L — ABNORMAL LOW (ref 3.5–5.1)

## 2020-09-22 MED ORDER — OXYCODONE-ACETAMINOPHEN 5-325 MG PO TABS: 1.0000 | ORAL_TABLET | ORAL | 0 refills | Status: DC | PRN

## 2020-09-22 MED ORDER — POTASSIUM CHLORIDE 20 MEQ PO PACK
60.0000 meq | PACK | Freq: Once | ORAL | Status: AC
  Administered 2020-09-22: 60 meq via ORAL
  Filled 2020-09-22: qty 3

## 2020-09-22 MED ORDER — DAPTOMYCIN IV (FOR PTA / DISCHARGE USE ONLY): 1000.0000 mg | INTRAVENOUS | 0 refills | Status: DC

## 2020-09-22 MED ORDER — CEFTRIAXONE IV (FOR PTA / DISCHARGE USE ONLY): 2.0000 g | INTRAVENOUS | 0 refills | Status: DC

## 2020-09-22 NOTE — TOC Progression Note (Addendum)
Transition of Care [facility]) - Progression Note    Patient Details  Name: Bill [name] MRN: [id number] Date of Birth: [birth year]  Transition of Care [facility]) CM/SW Contact  Nadene Rubins Adria Devon, RN Phone Number: 09/22/2020, 8:57 AM  Clinical Narrative:    See previous TOC Team note.  Per chart patient plans to discharge Sunday 09/23/20. Patient will need HHRN,PT,OT and Bariatric walker.   NCM called [employer] and left message for after hours regarding discharge needs for 09/23/20. Awaiting call back.    Called [facility] , left a message to see if they can provide wheel chair. [facility] unable to verify insurance they need a [place] address. Wife provided [address] [place] [address]     [facility] [phone] , answering service will have on call, call NCM back. Rachael returned call they are not in network with [employer].     [facility] [phone] left on call nurse a message.  [facility] [phone] spoke with Westley Hummer she will message on call nurse. Await call back. Lori returned call . Lawson Fiscal is "not allowed" to approve new referrals on the weekend, however NCM can fax referral to office to be reviewed on the weekend. [phone] referral faxed   [facility] [phone] spoke to answering service. On call nurse will call NCM back. Megan returned call . Megan instructed NCM to fax referral to office at [phone] , however, referral will not be reviewed until Monday . Referral faxed   [facility] [phone] spoke with Dondra Spry, they are not in network with patient's insurance   [facility] [phone] spoke to answering service , she will send message for referral to office however, referral will not be reviewed until Monday.Amy returned call they are not in network with [employer].  [facility] [phone] they are closed and on answering service.   NCM has exhausted home health list for patient's  address.   Await call backs.    Pam with [facility] aware.   Jules Schick and April at [facility] spoke last evening. April plans on having everything arranged for Monday.   Patient and wife aware.  Sent MD message   Expected Discharge Plan: Home w Home Health Services Barriers to Discharge: Continued Medical Work up  Expected Discharge Plan and Services Expected Discharge Plan: Home w Home Health Services   Discharge Planning Services: CM Consult     Expected Discharge Date: 09/23/20               DME Arranged: Other see comment (IV ABX therapy ( [facility]); [employer] assisting withlanding bariatric W/C) DME Agency: Other - Comment ([facility]) Date DME Agency Contacted: 09/20/20 Time DME Agency Contacted: 1600   HH Arranged: PT,RN,OT Risk analyst Home Health agency to provide RN) HH Agency:  [facility]/ RN) Date HH Agency Contacted: 09/20/20 Time HH Agency Contacted: 1438 ([employer] assisting to land [employer] ( [phone], ext. [phone])     Social Determinants of Health (SDOH) Interventions    Readmission Risk Interventions No flowsheet data found.

## 2020-09-22 NOTE — Discharge Summary (Signed)
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 male who was admitted 09/19/2020 with a chief complaint of infected left total knee arthroplasty, with a final diagnosis of Infected Left Total Knee Arthroplasty.  Patient was brought to the operating room on 09/19/2020 and underwent Procedure(s): REMOVE LEFT TOTAL KNEE ARTHROPLASTY, PLACE ANATOMIC ANTIBIOTIC SPACERS.    Patient was given perioperative antibiotics:  Anti-infectives (From admission, onward)   Start     Dose/Rate Route Frequency Ordered Stop   09/22/20 0000  cefTRIAXone (ROCEPHIN) IVPB        2 g Intravenous Every 24 hours 09/22/20 0842 11/02/20 2359   09/22/20 0000  daptomycin (CUBICIN) IVPB        1,000 mg Intravenous Every 24 hours 09/22/20 0842 11/02/20 2359   09/20/20 1800  cefTRIAXone (ROCEPHIN) 2 g in sodium chloride 0.9 % 100 mL IVPB        2 g 200 mL/hr over 30 Minutes Intravenous Every 24 hours 09/20/20 1329     09/20/20 1730  DAPTOmycin (CUBICIN) 1,000 mg in sodium chloride 0.9 % IVPB        1,000 mg 240 mL/hr over 30 Minutes Intravenous Daily 09/20/20 1635     09/20/20 0500  vancomycin (VANCOREADY) IVPB 1750 mg/350 mL  Status:  Discontinued        1,750 mg 175 mL/hr over 120 Minutes Intravenous Every 12 hours 09/19/20 1613 09/20/20 1329   09/19/20 1700  piperacillin-tazobactam (ZOSYN) IVPB 3.375 g  Status:  Discontinued        3.375 g 12.5 mL/hr over 240 Minutes Intravenous Every 8 hours 09/19/20 1606 09/20/20 1329   09/19/20 1700  vancomycin (VANCOCIN) 2,500 mg in sodium chloride 0.9 % 500 mL IVPB        2,500 mg 250 mL/hr over 120 Minutes Intravenous  Once 09/19/20 1613 09/19/20 2004   09/19/20 1010  vancomycin  (VANCOCIN) powder  Status:  Discontinued          As needed 09/19/20 1010 09/19/20 1154   09/19/20 0600  ceFAZolin (ANCEF) 3 g in dextrose 5 % 50 mL IVPB  Status:  Discontinued        3 g 100 mL/hr over 30 Minutes Intravenous On call to O.R. 09/18/20 1321 09/19/20 1555    .  Patient was given sequential compression devices, early ambulation, and aspirin for DVT prophylaxis.  Recent vital signs:  Patient Vitals for the past 24 hrs:  BP Temp Temp src Pulse Resp SpO2  09/22/20 0409 (!) 104/57 98 F (36.7 C) -- 66 18 100 %  09/21/20 1900 (!) 105/51 98.7 F (37.1 C) Oral 80 17 96 %  09/21/20 1440 137/88 98.7 F (37.1 C) Oral 80 -- 98 %  09/21/20 1007 105/70 98.2 F (36.8 C) Oral 71 17 99 %  .  Recent laboratory studies: No results found.  Discharge Medications:   Allergies as of 09/22/2020   No Known Allergies     Medication List    STOP taking these medications   diclofenac 75 MG EC tablet Commonly known as: VOLTAREN   diclofenac sodium 1 % Gel Commonly known as: VOLTAREN   doxycycline 100 MG capsule Commonly known as: VIBRAMYCIN   doxycycline 100 MG tablet Commonly known  as: VIBRA-TABS   meloxicam 15 MG tablet Commonly known as: Mobic     TAKE these medications   acetaminophen 500 MG tablet Commonly known as: TYLENOL Take 500-1,000 mg by mouth every 6 (six) hours as needed for mild pain or moderate pain.   bisoprolol 10 MG tablet Commonly known as: ZEBETA Take 10 mg by mouth at bedtime.   cefTRIAXone  IVPB Commonly known as: ROCEPHIN Inject 2 g into the vein daily. Indication:  PJI of left TKA  First Dose: Yes Last Day of Therapy:  10/31/20 Labs - Once weekly:  CBC/D and BMP, Labs - Every other week:  ESR and CRP Method of administration: IV Push Method of administration may be changed at the discretion of home infusion pharmacist based upon assessment of the patient and/or caregiver's ability to self-administer the medication ordered.   daptomycin   IVPB Commonly known as: CUBICIN Inject 1,000 mg into the vein daily. Indication:  PJI of the left TKA  First Dose: Yes Last Day of Therapy:  10/31/20 Labs - Once weekly:  CBC/D, BMP, and CPK Labs - Every other week:  ESR and CRP Method of administration: IV Push Method of administration may be changed at the discretion of home infusion pharmacist based upon assessment of the patient and/or caregiver's ability to self-administer the medication ordered.   furosemide 20 MG tablet Commonly known as: LASIX Take 20 mg by mouth daily as needed for fluid.   hydrochlorothiazide 25 MG tablet Commonly known as: HYDRODIURIL Take 25 mg by mouth daily.   losartan 100 MG tablet Commonly known as: COZAAR Take 100 mg by mouth daily.   Oxycodone HCl 10 MG Tabs Take 1-1.5 tablets (10-15 mg total) by mouth every 4 (four) hours as needed for severe pain (pain score 7-10).   oxyCODONE-acetaminophen 5-325 MG tablet Commonly known as: PERCOCET/ROXICET Take 1 tablet by mouth every 4 (four) hours as needed.   pravastatin 20 MG tablet Commonly known as: PRAVACHOL Take 20 mg by mouth at bedtime.   topiramate 50 MG tablet Commonly known as: TOPAMAX Take 50 mg by mouth 2 (two) times daily.            Durable Medical Equipment  (From admission, onward)         Start     Ordered   09/21/20 1426  For home use only DME standard manual wheelchair with seat cushion  Once       Comments: Patient suffers from infected total knee  which impairs their ability to perform daily activities like walking in the home.  A rolling walker will not resolve issue with performing activities of daily living. A wheelchair will allow patient to safely perform daily activities. Patient can safely propel the wheelchair in the home or has a caregiver who can provide assistance. Length of need 12 months Accessories: elevating leg rests (ELRs), wheel locks, extensions and anti-tippers. Pt needs BARIATRIC wheelchair.    09/21/20 1429           Discharge Care Instructions  (From admission, onward)         Start     Ordered   09/22/20 0000  Change dressing on IV access line weekly and PRN  (Home infusion instructions - [facility] )        09/22/20 0842   09/22/20 0000  Touch down weight bearing       Question Answer Comment  Laterality left   Extremity Lower      09/22/20  0842          Diagnostic Studies: NM Bone Scan 3 Phase Lower Extremity  Result Date: 08/31/2020 CLINICAL DATA:  Status post left total knee arthroplasty in 5697 complicated by septic arthritis in 2019 now with persistent left knee pain. Right total knee arthroplasty 2020. EXAM: NUCLEAR MEDICINE 3-PHASE BONE SCAN TECHNIQUE: Radionuclide angiographic images, immediate static blood pool images, and 3-hour delayed static images were obtained of the knees bilaterally after intravenous injection of radiopharmaceutical. RADIOPHARMACEUTICALS:  22.0 mCi Tc-64mMDP IV COMPARISON:  Plain radiograph 05/08/2020 and 10/28/2018 FINDINGS: Vascular phase: There is asymmetric increased perfusion diffusely of the left knee. Blood pool phase: There is diffusely increased hyperemia of the left knee surrounding the photopenic defect related to left total knee arthroplasty. No hyperemia involving the right knee. Delayed phase: Delayed images demonstrate diffusely increased uptake rib radiotracer within the distal left femur, patella, and proximal tibia. IMPRESSION: Diffusely increased perfusion, hyperemia, and delayed radiotracer uptake involving the left knee suspicious for changes of septic arthritis/osteomyelitis. Electronically Signed   By: AFidela SalisburyMD   On: 08/31/2020 13:13   [place]KG SITE RITE  Result Date: 09/19/2020 If Site Rite image not attached, placement could not be confirmed due to current cardiac rhythm.   Patient benefited maximally from their hospital stay and there were no complications.     Disposition: Discharge  disposition: 01-Home or Self Care      Discharge Instructions    [facility] pharmacist to adjust dose for Vancomycin, Aminoglycosides and other anti-infective therapies as requested by physician.   Complete by: As directed    [facility] to provide Cath Flo 245m  Complete by: As directed    Administer for PICC line occlusion and as ordered by physician for other access device issues.   Anaphylaxis Kit: Provided to treat any anaphylactic reaction to the medication being provided to the patient if First Dose or when requested by physician   Complete by: As directed    Epinephrine 75m38ml vial / amp: Administer 0.3mg53m.3ml)49mbcutaneously once for moderate to severe anaphylaxis, nurse to call physician and pharmacy when reaction occurs and call 911 if needed for immediate care   Diphenhydramine 50mg/7mV vial: Administer 25-50mg I65m PRN for first dose reaction, rash, itching, mild reaction, nurse to call physician and pharmacy when reaction occurs   Sodium Chloride 0.9% NS 500ml IV23mminister if needed for hypovolemic blood pressure drop or as ordered by physician after call to physician with anaphylactic reaction   Call MD / Call 911   Complete by: As directed    If you experience chest pain or shortness of breath, CALL 911 and be transported to the hospital emergency room.  If you develope a fever above 101 F, pus (white drainage) or increased drainage or redness at the wound, or calf pain, call your surgeon's office.   Change dressing on IV access line weekly and PRN   Complete by: As directed    Constipation Prevention   Complete by: As directed    Drink plenty of fluids.  Prune juice may be helpful.  You may use a stool softener, such as Colace (over the counter) 100 mg twice a day.  Use MiraLax (over the counter) for constipation as needed.   Diet - low sodium heart healthy   Complete by: As directed    Flush IV access with Sodium Chloride 0.9% and Heparin 10  units/ml or 100 units/ml   Complete by: As  directed    Home infusion instructions - [facility]   Complete by: As directed    Instructions: Flush IV access with Sodium Chloride 0.9% and Heparin 10units/ml or 100units/ml   Change dressing on IV access line: Weekly and PRN   Instructions Cath Flo 36m: Administer for PICC Line occlusion and as ordered by physician for other access device   [facility] pharmacist to adjust dose for: Vancomycin, Aminoglycosides and other anti-infective therapies as requested by physician   Increase activity slowly as tolerated   Complete by: As directed    Method of administration may be changed at the discretion of home infusion pharmacist based upon assessment of the patient and/or caregiver's ability to self-administer the medication ordered   Complete by: As directed    Negative Pressure Wound Therapy - Incisional   Complete by: As directed    Show patient how to attach wound vac   Touch down weight bearing   Complete by: As directed    Laterality: left   Extremity: Lower      Follow-up Information    DNewt Minion MD In 1 week.   Specialty: Orthopedic Surgery Contact information: [id number]               Signed: MNewt Minion2/26/2022, 8:45 AM

## 2020-09-22 NOTE — Progress Notes (Signed)
Patient ID: Bill [name], male   DOB: Jun 13, 1966, 55 y.o.   MRN: [id number] Patient has a well-functioning wound VAC canister no new drainage.  He denies any pain he is progressing with therapy patient states he would like to discharge to home tomorrow he has been instructed in IV antibiotics administration with his PICC line yesterday for both daptomycin and ceftriaxone.  Discharge to home on Sunday.  Discharge orders completed.

## 2020-09-22 NOTE — Progress Notes (Signed)
prevena alarming and making high pitched noise- since coming on- found tear in transparent dressing- right at the disk- applied a tegaderm at tear and reiniforced with tape- is working at this time

## 2020-09-22 NOTE — Progress Notes (Signed)
PT Cancellation Note  Patient Details Name: Bill [name] MRN: [id number] DOB: 1965/08/24   Cancelled Treatment:    Reason Eval/Treat Not Completed: Patient declined, no reason specified Patient refused as he just received pain medicine and stated "I will not be getting out of this bed unless you are going to pull me out." Educated on importance of OOB mobility and stair training in preparation to return home Sunday or Monday. Patient continued to refuse. PT will re-attempt as time allows.   Tonjua Rossetti A. Dan Humphreys PT, DPT Acute Rehabilitation Services Pager [phone] Office [phone]    Viviann Spare 09/22/2020, 10:53 AM

## 2020-09-23 NOTE — Progress Notes (Signed)
Physical Therapy Treatment Patient Details Name: Bill [name] MRN: [id number] DOB: 15-Jul-1966 Today's Date: 09/23/2020    History of Present Illness 55 y.o. M, Dx of infected left TKA, L TKA removed and replaced with anatomic antibiotic spacers    PT Comments    Continuing work on functional mobility and activity tolerance;  Session focused on planning for dc home; He was able to get up from bed and walk household distances with RW -- lots of cues and close monitoring for tWB LLE; took a lot of extra time to discuss and try out options for getting up the steps into his home (see stair section for various methods discussed; Bill [name] ultimately plans/wants to have his son help him up the steps (L hand on rail, R arm around son's shoulders) -- this technique is viable, however will need cues and monitoring for TWB; noted plan to dc home tomorrow -- we plan to see him for PT at 9:30 am tomorrow   Follow Up Recommendations  Home health PT;Supervision for mobility/OOB     Equipment Recommendations  Other (comment) (bari w/c, bari BSC)    Recommendations for Other Services       Precautions / Restrictions Precautions Precautions: Other (comment);Fall Precaution Comments: wound vac Required Braces or Orthoses: Knee Immobilizer - Left Knee Immobilizer - Left: On at all times Restrictions LLE Weight Bearing: Touchdown weight bearing    Mobility  Bed Mobility Overal bed mobility: Needs Assistance Bed Mobility: Supine to Sit     Supine to sit: Supervision Sit to supine: Min assist;HOB elevated   General bed mobility comments: Assist to bring LLE into bed to return to supine.    Transfers Overall transfer level: Needs assistance Equipment used: Rolling walker (2 wheeled) Transfers: Sit to/from Stand Sit to Stand: Min guard         General transfer comment: Min guard for safety, likley placing more than TDWB through LLE during transitions; took extra time for pt to  try differing hand positions; Cues for safe hand posistion -- pt often used differing position than usual; wehn he did, instructed pt to have a helper near to steady RW; Quite dependent on momentum to stand; stood from bed, commode in bathroom, and recliner  Ambulation/Gait Ambulation/Gait assistance: Min guard Gait Distance (Feet): 30 Feet Assistive device: Rolling walker (2 wheeled) Gait Pattern/deviations: Step-to pattern;Decreased stance time - left;Decreased weight shift to left;Trunk flexed Gait velocity: decreased   General Gait Details: Cues to maintain TWB; He is able to maintain TWB well for short distances   Stairs Stairs: Yes Stairs assistance: Min assist Stair Management: One rail Right;Backwards;With walker Number of Stairs: 1 General stair comments: attempted first step, however pt unable to keep LLE TWB, and ultimately was not successful; Took a lot of extra time to discuss options fo rgetting up the steps and into his home including RW and rail backwards, rail and arm over son's shoulder, back up one step and then sit up the rest to a chair or wheelchair, ambulance ride (pt declines ambulance ride)   Wheelchair Mobility    Modified Rankin (Stroke Patients Only)       Balance     Sitting balance-Bill [name] Scale: Good       Standing balance-Bill [name] Scale: Poor                              Cognition Arousal/Alertness: Awake/alert Behavior During Therapy: [facility] for tasks assessed/performed  Overall Cognitive Status: Within Functional Limits for tasks assessed                                        Exercises      General Comments        Pertinent Vitals/Pain Pain Assessment: Faces Faces Pain Scale: Hurts little more Pain Location: L LE Pain Descriptors / Indicators: Aching;Sore Pain Intervention(s): Monitored during session    Home Living                      Prior Function            PT Goals (current goals can  now be found in the care plan section) Acute Rehab PT Goals Patient Stated Goal: to return home PT Goal Formulation: With patient Time For Goal Achievement: 10/04/20 Potential to Achieve Goals: Good Progress towards PT goals: Progressing toward goals    Frequency    Min 5X/week      PT Plan Current plan remains appropriate    Co-evaluation              AM-PAC PT "6 Clicks" Mobility   Outcome Measure  Help needed turning from your back to your side while in a flat bed without using bedrails?: A Little Help needed moving from lying on your back to sitting on the side of a flat bed without using bedrails?: A Little Help needed moving to and from a bed to a chair (including a wheelchair)?: A Little Help needed standing up from a chair using your arms (e.g., wheelchair or bedside chair)?: A Little Help needed to walk in hospital room?: A Little Help needed climbing 3-5 steps with a railing? : A Lot 6 Click Score: 17    End of Session Equipment Utilized During Treatment: Left knee immobilizer Activity Tolerance: Patient limited by pain;Patient limited by fatigue Patient left: in bed;with call bell/phone within reach;with bed alarm set Nurse Communication: Mobility status;Patient requests pain meds PT Visit Diagnosis: Unsteadiness on feet (R26.81);Other abnormalities of gait and mobility (R26.89);Muscle weakness (generalized) (M62.81);Difficulty in walking, not elsewhere classified (R26.2);Pain Pain - Right/Left: Left Pain - part of body: Knee;Leg     Time: 2841-3244 PT Time Calculation (min) (ACUTE ONLY): 75 min  Charges:  $Gait Training: 23-37 mins $Therapeutic Activity: 38-52 mins                     Bill [name], PT  Acute Rehabilitation Services Pager [phone] Office [phone]    Bill [name] 09/23/2020, 8:12 PM

## 2020-09-23 NOTE — Progress Notes (Signed)
     Subjective: 4 Days Post-Op Procedure(s) (LRB): REMOVE LEFT TOTAL KNEE ARTHROPLASTY, PLACE ANATOMIC ANTIBIOTIC SPACERS (Left) Dr. Lajoyce Corners had recommended discharge today but IV antibiotic and PICCU Line care is not set up. Last evening the left BKA dressing preveena began to not draw negative pressure. I reinforced with opsite but the is still an alarm. I recommended to nursing that the preveena cannister and the preveena itself may nee changing and they are looking into it. Nurse is not aware they are kept on floor.  Patient reports pain as mild.    Objective:   VITALS:  Temp:  [98.8 F (37.1 C)-98.9 F (37.2 C)] 98.8 F (37.1 C) (02/27 0300) Pulse Rate:  [70-72] 70 (02/27 0300) Resp:  [18] 18 (02/27 0300) BP: (99-111)/(63-64) 111/64 (02/27 0300) SpO2:  [99 %-100 %] 100 % (02/27 0300)  Neurologically intact ABD soft Neurovascular intact Sensation intact distally Intact pulses distally Dorsiflexion/Plantar flexion intact Incision: dressing C/D/I and no drainage   LABS Recent Labs    09/21/20 0311  HGB 9.4*  WBC 7.9  PLT 185   Recent Labs    09/21/20 0311 09/22/20 0930  NA 139  --   K 3.1* 3.2*  CL 102  --   CO2 24  --   BUN 22*  --   CREATININE 1.16  --   GLUCOSE 129*  --    No results for input(s): LABPT, INR in the last 72 hours.   Assessment/Plan: 4 Days Post-Op Procedure(s) (LRB): REMOVE LEFT TOTAL KNEE ARTHROPLASTY, PLACE ANATOMIC ANTIBIOTIC SPACERS (Left)  Up with therapy D/C IV fluids Continue ABX therapy due to Post-op infection  He is not ready to go today due to home planning for antibiotics and  PICCU line care, not set up yet. Will try to get the preveenna functioning. I don't think it is a dressing leak but likely the preveena and tubing.   Bill [name] 09/23/2020, 11:39 AMPatient ID: Bill [name], male   DOB: 1966-04-20, 55 y.o.   MRN: [id number]

## 2020-09-24 ENCOUNTER — Telehealth: Payer: Self-pay | Admitting: Orthopedic Surgery

## 2020-09-24 LAB — AEROBIC/ANAEROBIC CULTURE W GRAM STAIN (SURGICAL/DEEP WOUND)

## 2020-09-24 MED ORDER — PENICILLIN G POTASSIUM IV (FOR PTA / DISCHARGE USE ONLY): 24.0000 10*6.[IU] | INTRAVENOUS | 0 refills | Status: AC

## 2020-09-24 MED ORDER — DOXYCYCLINE HYCLATE 100 MG PO TABS
100.0000 mg | ORAL_TABLET | Freq: Two times a day (BID) | ORAL | Status: DC
  Administered 2020-09-24: 100 mg via ORAL
  Filled 2020-09-24: qty 1

## 2020-09-24 MED ORDER — PENICILLIN G POTASSIUM 20000000 UNITS IJ SOLR
12.0000 10*6.[IU] | Freq: Two times a day (BID) | INTRAVENOUS | Status: DC
  Administered 2020-09-24: 12 10*6.[IU] via INTRAVENOUS
  Filled 2020-09-24 (×4): qty 12

## 2020-09-24 MED ORDER — DOXYCYCLINE HYCLATE 100 MG PO CAPS: 100.0000 mg | ORAL_CAPSULE | Freq: Two times a day (BID) | ORAL | 1 refills | Status: DC

## 2020-09-24 MED ORDER — HYDROMORPHONE HCL 1 MG/ML IJ SOLN
1.0000 mg | Freq: Once | INTRAMUSCULAR | Status: AC
  Administered 2020-09-24: 1 mg via INTRAVENOUS
  Filled 2020-09-24: qty 1

## 2020-09-24 NOTE — Telephone Encounter (Signed)
Per Dr. Lajoyce Corners this has been addressed.

## 2020-09-24 NOTE — Care Management (Cosign Needed)
    Durable Medical Equipment  (From admission, onward)         Start     Ordered   09/24/20 1208  For home use only DME 3 n 1  Once       Comments: BARIATRIC 3 IN 1 PLEASE   09/24/20 1208   09/21/20 1426  For home use only DME standard manual wheelchair with seat cushion  Once       Comments: Patient suffers from infected total knee  which impairs their ability to perform daily activities like walking in the home.  A rolling walker will not resolve issue with performing activities of daily living. A wheelchair will allow patient to safely perform daily activities. Patient can safely propel the wheelchair in the home or has a caregiver who can provide assistance. Length of need 12 months Accessories: elevating leg rests (ELRs), wheel locks, extensions and anti-tippers. Pt needs BARIATRIC wheelchair.   09/21/20 1429          Quintella Baton, RN, BSN  Trauma/Neuro ICU Case Manager [phone]

## 2020-09-24 NOTE — Progress Notes (Signed)
Focus of session on educating pt and wife in compensatory strategies for LB ADL, uses of bariatric 3 in 1 and shower transfer when MD approves showering. Pt and wife verbalizing understanding.    09/24/20 1054  OT Visit Information  Last OT Received On 09/24/20  Assistance Needed +1 (2 for stairs)  History of Present Illness 55 y.o. M, Dx of infected left TKA, L TKA removed and replaced with anatomic antibiotic spacers  Precautions  Precautions Fall;Other (comment)  Precaution Comments wound vac  Required Braces or Orthoses Knee Immobilizer - Left  Knee Immobilizer - Left On at all times  Pain Assessment  Pain Assessment Faces  Faces Pain Scale 2  Pain Location L LE  Pain Descriptors / Indicators Sore  Pain Intervention(s) Repositioned;Premedicated before session  Cognition  Arousal/Alertness Awake/alert  Behavior During Therapy WFL for tasks assessed/performed  Overall Cognitive Status Within Functional Limits for tasks assessed  ADL  General ADL Comments Educated pt and wife in compensatory strategies for LB bathing and dressing using AE. Pt declined a sock aid, has a reacher at home, recommended long handled bath sponge. Pt and wife educated in use of bariatric 3 in 1 as shower seat, pt does not think he will need 3 in 1 over toilet, but does use the one in his room in standing instead of a urinal. Demonstrated transfer to shower adhering to TDWB on L when pt is allowed to shower.  Restrictions  LLE Weight Bearing TWB  OT - End of Session  Equipment Utilized During Treatment Left knee immobilizer  Activity Tolerance Patient tolerated treatment well  Patient left in bed;with call bell/phone within reach;with family/visitor present  OT Assessment/Plan  OT Plan Discharge plan remains appropriate  OT Visit Diagnosis Other abnormalities of gait and mobility (R26.89);Pain  Pain - Right/Left Left  Pain - part of body Knee;Leg  OT Frequency (ACUTE ONLY) Min 2X/week  Follow Up  Recommendations Home health OT  OT Equipment  (bariatric 3 in 1)  AM-PAC OT "6 Clicks" Daily Activity Outcome Measure (Version 2)  Help from another person eating meals? 4  Help from another person taking care of personal grooming? 3  Help from another person toileting, which includes using toliet, bedpan, or urinal? 3  Help from another person bathing (including washing, rinsing, drying)? 2  Help from another person to put on and taking off regular upper body clothing? 3  Help from another person to put on and taking off regular lower body clothing? 2  6 Click Score 17  OT Goal Progression  Progress towards OT goals Progressing toward goals  Acute Rehab OT Goals  Patient Stated Goal to return home -- he is hopeful for dc today  OT Goal Formulation With patient/family  Time For Goal Achievement 10/04/20  Potential to Achieve Goals Good  OT Time Calculation  OT Start Time (ACUTE ONLY) 1037  OT Stop Time (ACUTE ONLY) 1052  OT Time Calculation (min) 15 min  OT General Charges  $OT Visit 1 Visit  OT Treatments  $Self Care/Home Management  8-22 mins  Martie Round, OTR/L Acute Rehabilitation Services Pager: [phone] Office: [phone]

## 2020-09-24 NOTE — Progress Notes (Addendum)
Pt is ready to be discharged, discharge instructions given to pt and wife. Waiting for [facility] to transport pt  Home. PICC to right upper arm with dressing dry and intact. Home IV antibiotic was connected by Pam from [facility].

## 2020-09-24 NOTE — Telephone Encounter (Signed)
Bill [name] from [facility] called. Patient will be discharged today but the DC orders are incorrect. She would like DC orders updated for wound care and IV Antibiotics. Her call back number is [phone]

## 2020-09-24 NOTE — TOC Transition Note (Signed)
Transition of Care [facility]) - CM/SW Discharge Note   Patient Details  Name: Bill [name] MRN: [id number] Date of Birth: Dec 20, 1965  Transition of Care [facility]) CM/SW Contact:  Glennon Mac, RN Phone Number: 09/24/2020, 4:50 PM   Clinical Narrative: Patient medically stable for discharge home today with family and home health services.  [facility] plans to deliver patient's IV medications between 3:30 and 4:30 today and hook patient up to IV pump.  Able to secure home health services with [facility] for RN/PT/OT; spoke with Bonita Quin at home health agency and faxed discharge summary to her.  Fax number [phone].  [facility] has delivered wheelchair and 3 in 1 to room; wife to take DME home with her.  Called for nonemergent ambulance transport by PTR for after 4:30 PM.  Reviewed all arrangements with patient/wife; they are appreciative of all help given.  Final next level of care: Home w Home Health Services Barriers to Discharge: Barriers Resolved   Patient Goals and CMS Choice Patient states their goals for this hospitalization and ongoing recovery are:: to go home CMS Medicare.gov Compare Post Acute Care list provided to:: Patient Choice offered to / list presented to : Patient                        Discharge Plan and Services   Discharge Planning Services: CM Consult Post Acute Care Choice: Home Health          DME Arranged: 3-N-1,Wheelchair manual,IV pump/equipment DME Agency: [facility],Other - Comment Date DME Agency Contacted: 09/24/20 Time DME Agency Contacted: 1600 Representative spoke with at DME Agency: Velna Hatchet with [employer], Jeri Modena with [facility] Arranged: PT,RN,OT [facility] agency to provide RN) [facility] Agency: [facility] [place] Date [facility] Agency Contacted: 09/20/20 Time HH Agency Contacted: 1530 Representative spoke with at [facility] Agency: Bonita Quin  Social Determinants of Health (SDOH) Interventions      Readmission Risk Interventions Readmission Risk Prevention Plan 09/24/2020  Post Dischage Appt Complete  Medication Screening Complete  Transportation Screening Complete  Some recent data might be hidden   Quintella Baton, RN, BSN  Trauma/Neuro ICU Case Manager [phone]

## 2020-09-24 NOTE — Plan of Care (Signed)

## 2020-09-24 NOTE — Progress Notes (Signed)
02/28 2050 Pt transported by [facility] to home. Pt stable. Pt denies pain. Pt belongings given to wife.

## 2020-09-24 NOTE — Progress Notes (Signed)
Patient ID: Bill [name], male   DOB: 1966-01-20, 55 y.o.   MRN: [id number] Patient is seen in follow-up for removal infected left total knee with placement of an anatomic spacer.  The drain tubing from the wound VAC sponge was pulled out over the weekend attempts to reseal it were unsuccessful.  States that the home IV and home therapy has not been set up yet and he states he does not feel safe with therapy.  I discussed that if he is not safe with therapy we would need to discharge to a skilled nursing facility patient states that he misspoke that he actually feels very safe with therapy.  We will reviewed the IV antibiotic and home therapy to ensure that patient is safe for discharge to home today.  We will remove the wound VAC dressing that is nonfunctional and apply Aquacel dressing.  Patient will continue with the knee immobilizer to provide stability for ambulation.  Anticipate discharge to home today.

## 2020-09-24 NOTE — Plan of Care (Signed)

## 2020-09-24 NOTE — Progress Notes (Addendum)
Physical Therapy Treatment Patient Details Name: Bill [name] MRN: [id number] DOB: 1966-04-30 Today's Date: 09/24/2020    History of Present Illness 55 y.o. M, Dx of infected left TKA, L TKA removed and replaced with anatomic antibiotic spacers    PT Comments    Continuing work on functional mobility and activity tolerance;  Session focused on planning for dc home today, with particular focus on getting in the house; My official rec at this time is for getting a non-emergent ambulance ride home, and Bill [name] is in agreement; He also describes a do-able plan for his son to help him up the steps; My main feedback on this plan is to make sure he minimizes weight on his LLE, and he verbalized understanding -- he didn't opt to practice this technique, or try the technique using a 3in1 (with differing leg heights) to help up the steps; if I'm able to connect with his daughter Bill [name] (a nurse on [employer]W) today, I'd like to teach her this technique as an option; Pt looked up the website to [employer] for ideas to get a ramp put in  He seems excited at the notion of getting home.  Follow Up Recommendations  Home health PT;Supervision for mobility/OOB     Equipment Recommendations  Other (comment) (bari w/c, bari BSC; Non-emergent ambulance ride home)    Recommendations for Other Services       Precautions / Restrictions Precautions Precautions: Other (comment);Fall Precaution Comments: wound vac (should be coming off today) Required Braces or Orthoses: Knee Immobilizer - Left Knee Immobilizer - Left: On at all times Restrictions LLE Weight Bearing: Touchdown weight bearing    Mobility  Bed Mobility                    Transfers                    Ambulation/Gait                 Stairs         General stair comments: Continued discussion re: stir management -- big picture: Bill [name] is now more open to non-emergent ambulance transport home; messaged TOC Team to look  into insurance coverage/cost; Ambulance ride is my recommendation for safe, efficient transport home; if ambulance ride is not an option, pt plans to go up stairs facing forward, with LUE on rail and RUE around his son's shoulders; We discussed sequence for going up and this therapist demonstrated positioning for son (inclusing foot placement on steps); Ultimately, pt declined practicing stairs   Wheelchair Mobility    Modified Rankin (Stroke Patients Only)       Balance     Sitting balance-Leahy Scale: Good       Standing balance-Leahy Scale: Poor                              Cognition Arousal/Alertness: Awake/alert Behavior During Therapy: WFL for tasks assessed/performed Overall Cognitive Status: Within Functional Limits for tasks assessed                                        Exercises      General Comments General comments (skin integrity, edema, etc.): Noted muscle fiber spasm in L calf causing discomfort; performed soft tissue mobilization aimed at relaxing the muscle, and educated pt on  placing a raquetball in that area under calf for targeted therapeutic pressure at home      Pertinent Vitals/Pain Pain Assessment: Faces Faces Pain Scale: Hurts little more Pain Location: L LE; including tightness in calf Pain Descriptors / Indicators: Aching;Spasm;Sore Pain Intervention(s): Monitored during session    Home Living                      Prior Function            PT Goals (current goals can now be found in the care plan section) Acute Rehab PT Goals Patient Stated Goal: to return home -- he is hopeful for dc today PT Goal Formulation: With patient Time For Goal Achievement: 10/04/20 Potential to Achieve Goals: Good Progress towards PT goals: Progressing toward goals    Frequency    Min 5X/week      PT Plan Current plan remains appropriate;Other (comment) (Except will update dc recs to include ambulance ride  home)    Co-evaluation              AM-PAC PT "6 Clicks" Mobility   Outcome Measure  Help needed turning from your back to your side while in a flat bed without using bedrails?: A Little Help needed moving from lying on your back to sitting on the side of a flat bed without using bedrails?: A Little Help needed moving to and from a bed to a chair (including a wheelchair)?: A Little Help needed standing up from a chair using your arms (e.g., wheelchair or bedside chair)?: A Little Help needed to walk in hospital room?: A Little Help needed climbing 3-5 steps with a railing? : A Lot 6 Click Score: 17    End of Session Equipment Utilized During Treatment: Left knee immobilizer Activity Tolerance: Patient limited by pain Patient left: in bed;with call bell/phone within reach;with bed alarm set;Other (comment) (pt with some anxiety with anticipation of pain with upcoming dressing change) Nurse Communication: Mobility status PT Visit Diagnosis: Unsteadiness on feet (R26.81);Other abnormalities of gait and mobility (R26.89);Muscle weakness (generalized) (M62.81);Difficulty in walking, not elsewhere classified (R26.2);Pain Pain - Right/Left: Left Pain - part of body: Knee;Leg     Time: 5277-8242 PT Time Calculation (min) (ACUTE ONLY): 36 min  Charges:  $Self Care/Home Management: 23-37                     Van Clines, PT  Acute Rehabilitation Services Pager [phone] Office [phone]    Levi Aland 09/24/2020, 10:44 AM

## 2020-09-24 NOTE — Discharge Summary (Signed)
Discharge Diagnoses:  Active Problems:   Infection of total knee replacement (HCC)   Hardware complicating wound infection (HCC)   Surgeries: Procedure(s): REMOVE LEFT TOTAL KNEE ARTHROPLASTY, PLACE ANATOMIC ANTIBIOTIC SPACERS on 09/19/2020    Consultants:   Discharged Condition: Improved  Hospital Course: Bill [name] is an 55 y.o. male who was admitted 09/19/2020 with a chief complaint of left knee pain, with a final diagnosis of Infected Left Total Knee Arthroplasty.  Patient was brought to the operating room on 09/19/2020 and underwent Procedure(s): REMOVE LEFT TOTAL KNEE ARTHROPLASTY, PLACE ANATOMIC ANTIBIOTIC SPACERS.    Patient was given perioperative antibiotics:  Anti-infectives (From admission, onward)   Start     Dose/Rate Route Frequency Ordered Stop   09/22/20 0000  cefTRIAXone (ROCEPHIN) IVPB        2 g Intravenous Every 24 hours 09/22/20 0842 11/02/20 2359   09/22/20 0000  daptomycin (CUBICIN) IVPB        1,000 mg Intravenous Every 24 hours 09/22/20 0842 11/02/20 2359   09/20/20 1800  cefTRIAXone (ROCEPHIN) 2 g in sodium chloride 0.9 % 100 mL IVPB        2 g 200 mL/hr over 30 Minutes Intravenous Every 24 hours 09/20/20 1329     09/20/20 1730  DAPTOmycin (CUBICIN) 1,000 mg in sodium chloride 0.9 % IVPB        1,000 mg 240 mL/hr over 30 Minutes Intravenous Daily 09/20/20 1635     09/20/20 0500  vancomycin (VANCOREADY) IVPB 1750 mg/350 mL  Status:  Discontinued        1,750 mg 175 mL/hr over 120 Minutes Intravenous Every 12 hours 09/19/20 1613 09/20/20 1329   09/19/20 1700  piperacillin-tazobactam (ZOSYN) IVPB 3.375 g  Status:  Discontinued        3.375 g 12.5 mL/hr over 240 Minutes Intravenous Every 8 hours 09/19/20 1606 09/20/20 1329   09/19/20 1700  vancomycin (VANCOCIN) 2,500 mg in sodium chloride 0.9 % 500 mL IVPB        2,500 mg 250 mL/hr over 120 Minutes Intravenous  Once 09/19/20 1613 09/19/20 2004   09/19/20 1010  vancomycin (VANCOCIN) powder   Status:  Discontinued          As needed 09/19/20 1010 09/19/20 1154   09/19/20 0600  ceFAZolin (ANCEF) 3 g in dextrose 5 % 50 mL IVPB  Status:  Discontinued        3 g 100 mL/hr over 30 Minutes Intravenous On call to O.R. 09/18/20 1321 09/19/20 1555    .  Patient was given sequential compression devices, early ambulation, and aspirin for DVT prophylaxis.  Recent vital signs:  Patient Vitals for the past 24 hrs:  BP Temp Temp src Pulse Resp SpO2  09/23/20 2100 124/75 98.6 F (37 C) Oral 75 19 100 %  .  Recent laboratory studies: No results found.  Discharge Medications:   Allergies as of 09/24/2020   No Known Allergies     Medication List    STOP taking these medications   diclofenac 75 MG EC tablet Commonly known as: VOLTAREN   diclofenac sodium 1 % Gel Commonly known as: VOLTAREN   doxycycline 100 MG capsule Commonly known as: VIBRAMYCIN   doxycycline 100 MG tablet Commonly known as: VIBRA-TABS   meloxicam 15 MG tablet Commonly known as: Mobic     TAKE these medications   acetaminophen 500 MG tablet Commonly known as: TYLENOL Take 500-1,000 mg by mouth every 6 (six) hours as needed for mild pain   or moderate pain.   bisoprolol 10 MG tablet Commonly known as: ZEBETA Take 10 mg by mouth at bedtime.   cefTRIAXone  IVPB Commonly known as: ROCEPHIN Inject 2 g into the vein daily. Indication:  PJI of left TKA  First Dose: Yes Last Day of Therapy:  10/31/20 Labs - Once weekly:  CBC/D and BMP, Labs - Every other week:  ESR and CRP Method of administration: IV Push Method of administration may be changed at the discretion of home infusion pharmacist based upon assessment of the patient and/or caregiver's ability to self-administer the medication ordered.   daptomycin  IVPB Commonly known as: CUBICIN Inject 1,000 mg into the vein daily. Indication:  PJI of the left TKA  First Dose: Yes Last Day of Therapy:  10/31/20 Labs - Once weekly:  CBC/D, BMP, and CPK Labs -  Every other week:  ESR and CRP Method of administration: IV Push Method of administration may be changed at the discretion of home infusion pharmacist based upon assessment of the patient and/or caregiver's ability to self-administer the medication ordered.   furosemide 20 MG tablet Commonly known as: LASIX Take 20 mg by mouth daily as needed for fluid.   hydrochlorothiazide 25 MG tablet Commonly known as: HYDRODIURIL Take 25 mg by mouth daily.   losartan 100 MG tablet Commonly known as: COZAAR Take 100 mg by mouth daily.   oxyCODONE-acetaminophen 5-325 MG tablet Commonly known as: PERCOCET/ROXICET Take 1 tablet by mouth every 4 (four) hours as needed.   pravastatin 20 MG tablet Commonly known as: PRAVACHOL Take 20 mg by mouth at bedtime.   topiramate 50 MG tablet Commonly known as: TOPAMAX Take 50 mg by mouth 2 (two) times daily.            Durable Medical Equipment  (From admission, onward)         Start     Ordered   09/21/20 1426  For home use only DME standard manual wheelchair with seat cushion  Once       Comments: Patient suffers from infected total knee  which impairs their ability to perform daily activities like walking in the home.  A rolling walker will not resolve issue with performing activities of daily living. A wheelchair will allow patient to safely perform daily activities. Patient can safely propel the wheelchair in the home or has a caregiver who can provide assistance. Length of need 12 months Accessories: elevating leg rests (ELRs), wheel locks, extensions and anti-tippers. Pt needs BARIATRIC wheelchair.   09/21/20 1429           Discharge Care Instructions  (From admission, onward)         Start     Ordered   09/22/20 0000  Change dressing on IV access line weekly and PRN  (Home infusion instructions - [facility] )        09/22/20 0842   09/22/20 0000  Touch down weight bearing       Question Answer Comment  Laterality  left   Extremity Lower      09/22/20 0842          Diagnostic Studies: NM Bone Scan 3 Phase Lower Extremity  Result Date: 08/31/2020 CLINICAL DATA:  Status post left total knee arthroplasty in 2563 complicated by septic arthritis in 2019 now with persistent left knee pain. Right total knee arthroplasty 2020. EXAM: NUCLEAR MEDICINE 3-PHASE BONE SCAN TECHNIQUE: Radionuclide angiographic images, immediate static blood pool images, and 3-hour delayed static  images were obtained of the knees bilaterally after intravenous injection of radiopharmaceutical. RADIOPHARMACEUTICALS:  22.0 mCi Tc-42mMDP IV COMPARISON:  Plain radiograph 05/08/2020 and 10/28/2018 FINDINGS: Vascular phase: There is asymmetric increased perfusion diffusely of the left knee. Blood pool phase: There is diffusely increased hyperemia of the left knee surrounding the photopenic defect related to left total knee arthroplasty. No hyperemia involving the right knee. Delayed phase: Delayed images demonstrate diffusely increased uptake rib radiotracer within the distal left femur, patella, and proximal tibia. IMPRESSION: Diffusely increased perfusion, hyperemia, and delayed radiotracer uptake involving the left knee suspicious for changes of septic arthritis/osteomyelitis. Electronically Signed   By: AFidela SalisburyMD   On: 08/31/2020 13:13   [place]KG SITE RITE  Result Date: 09/19/2020 If Site Rite image not attached, placement could not be confirmed due to current cardiac rhythm.   Patient benefited maximally from their hospital stay and there were no complications.     Disposition: Discharge disposition: 01-Home or Self Care      Discharge Instructions    [facility] pharmacist to adjust dose for Vancomycin, Aminoglycosides and other anti-infective therapies as requested by physician.   Complete by: As directed    [facility] to provide Cath Flo 2331m  Complete by: As directed    Administer for PICC line  occlusion and as ordered by physician for other access device issues.   Anaphylaxis Kit: Provided to treat any anaphylactic reaction to the medication being provided to the patient if First Dose or when requested by physician   Complete by: As directed    Epinephrine 31m35ml vial / amp: Administer 0.3mg12m.3ml)49mbcutaneously once for moderate to severe anaphylaxis, nurse to call physician and pharmacy when reaction occurs and call 911 if needed for immediate care   Diphenhydramine 50mg/38mV vial: Administer 25-50mg I77m PRN for first dose reaction, rash, itching, mild reaction, nurse to call physician and pharmacy when reaction occurs   Sodium Chloride 0.9% NS 500ml IV107mminister if needed for hypovolemic blood pressure drop or as ordered by physician after call to physician with anaphylactic reaction   Call MD / Call 911   Complete by: As directed    If you experience chest pain or shortness of breath, CALL 911 and be transported to the hospital emergency room.  If you develope a fever above 101 F, pus (white drainage) or increased drainage or redness at the wound, or calf pain, call your surgeon's office.   Call MD / Call 911   Complete by: As directed    If you experience chest pain or shortness of breath, CALL 911 and be transported to the hospital emergency room.  If you develope a fever above 101 F, pus (white drainage) or increased drainage or redness at the wound, or calf pain, call your surgeon's office.   Change dressing on IV access line weekly and PRN   Complete by: As directed    Constipation Prevention   Complete by: As directed    Drink plenty of fluids.  Prune juice may be helpful.  You may use a stool softener, such as Colace (over the counter) 100 mg twice a day.  Use MiraLax (over the counter) for constipation as needed.   Constipation Prevention   Complete by: As directed    Drink plenty of fluids.  Prune juice may be helpful.  You may use a stool softener, such as Colace  (over the counter) 100 mg twice a day.  Use MiraLax (  over the counter) for constipation as needed.   Diet - low sodium heart healthy   Complete by: As directed    Diet - low sodium heart healthy   Complete by: As directed    Flush IV access with Sodium Chloride 0.9% and Heparin 10 units/ml or 100 units/ml   Complete by: As directed    Home infusion instructions - [facility]   Complete by: As directed    Instructions: Flush IV access with Sodium Chloride 0.9% and Heparin 10units/ml or 100units/ml   Change dressing on IV access line: Weekly and PRN   Instructions Cath Flo 36m: Administer for PICC Line occlusion and as ordered by physician for other access device   [facility] pharmacist to adjust dose for: Vancomycin, Aminoglycosides and other anti-infective therapies as requested by physician   Increase activity slowly as tolerated   Complete by: As directed    Increase activity slowly as tolerated   Complete by: As directed    Method of administration may be changed at the discretion of home infusion pharmacist based upon assessment of the patient and/or caregiver's ability to self-administer the medication ordered   Complete by: As directed    Negative Pressure Wound Therapy - Incisional   Complete by: As directed    Show patient how to attach wound vac   Touch down weight bearing   Complete by: As directed    Laterality: left   Extremity: Lower      Follow-up Information    DNewt Minion MD In 1 week.   Specialty: Orthopedic Surgery Contact information: [phone]               Signed: MBevely PalmerPersons 09/24/2020, 8:16 AM

## 2020-09-24 NOTE — Discharge Summary (Signed)
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 male who was admitted 09/19/2020 with a chief complaint of Infected left knee replacement, with a final diagnosis of Infected Left Total Knee Arthroplasty.  Patient was brought to the operating room on 09/19/2020 and underwent Procedure(s): REMOVE LEFT TOTAL KNEE ARTHROPLASTY, PLACE ANATOMIC ANTIBIOTIC SPACERS.    Patient was given perioperative antibiotics:  Anti-infectives (From admission, onward)   Start     Dose/Rate Route Frequency Ordered Stop   09/24/20 1000  penicillin G potassium 12 Million Units in dextrose 5 % 500 mL continuous infusion        12 Million Units 41.7 mL/hr over 12 Hours Intravenous Every 12 hours 09/24/20 0906     09/24/20 1000  doxycycline (VIBRA-TABS) tablet 100 mg        100 mg Oral Every 12 hours 09/24/20 0906     09/24/20 0000  doxycycline (VIBRAMYCIN) 100 MG capsule        100 mg Oral 2 times daily 09/24/20 0902 11/23/20 2359   09/24/20 0000  penicillin G IVPB        24 Million Units Intravenous Every 24 hours 09/24/20 0902 10/31/20 2359   09/22/20 0000  cefTRIAXone (ROCEPHIN) IVPB  Status:  Discontinued        2 g Intravenous Every 24 hours 09/22/20 0842 09/24/20    09/22/20 0000  daptomycin (CUBICIN) IVPB  Status:  Discontinued        1,000 mg Intravenous Every 24 hours 09/22/20 0842 09/24/20    09/20/20 1800  cefTRIAXone (ROCEPHIN) 2 g in sodium chloride 0.9 % 100 mL IVPB  Status:  Discontinued        2 g 200 mL/hr over 30 Minutes Intravenous Every 24 hours 09/20/20 1329 09/24/20 0906   09/20/20 1730  DAPTOmycin (CUBICIN) 1,000 mg in sodium chloride 0.9 % IVPB  Status:  Discontinued        1,000 mg 240 mL/hr  over 30 Minutes Intravenous Daily 09/20/20 1635 09/24/20 0906   09/20/20 0500  vancomycin (VANCOREADY) IVPB 1750 mg/350 mL  Status:  Discontinued        1,750 mg 175 mL/hr over 120 Minutes Intravenous Every 12 hours 09/19/20 1613 09/20/20 1329   09/19/20 1700  piperacillin-tazobactam (ZOSYN) IVPB 3.375 g  Status:  Discontinued        3.375 g 12.5 mL/hr over 240 Minutes Intravenous Every 8 hours 09/19/20 1606 09/20/20 1329   09/19/20 1700  vancomycin (VANCOCIN) 2,500 mg in sodium chloride 0.9 % 500 mL IVPB        2,500 mg 250 mL/hr over 120 Minutes Intravenous  Once 09/19/20 1613 09/19/20 2004   09/19/20 1010  vancomycin (VANCOCIN) powder  Status:  Discontinued          As needed 09/19/20 1010 09/19/20 1154   09/19/20 0600  ceFAZolin (ANCEF) 3 g in dextrose 5 % 50 mL IVPB  Status:  Discontinued        3 g 100 mL/hr over 30 Minutes Intravenous On call to O.R. 09/18/20 1321 09/19/20 1555    .  Patient was given sequential compression devices, early ambulation, and aspirin for DVT prophylaxis.  Recent vital signs:  Patient Vitals for the past 24 hrs:  BP Temp Temp src Pulse Resp SpO2  09/24/20 0837 99/66 97.9 F (36.6 C) Oral 74 18 97 %  09/23/20 2100 124/75 98.6 F (37 C) Oral 75 19 100 %  .  Recent laboratory studies: No results found.  Discharge Medications:   Allergies as of 09/24/2020   No Known Allergies     Medication List    STOP taking these medications   diclofenac 75 MG EC tablet Commonly known as: VOLTAREN   diclofenac sodium 1 % Gel Commonly known as: VOLTAREN   meloxicam 15 MG tablet Commonly known as: Mobic     TAKE these medications   acetaminophen 500 MG tablet Commonly known as: TYLENOL Take 500-1,000 mg by mouth every 6 (six) hours as needed for mild pain or moderate pain.   bisoprolol 10 MG tablet Commonly known as: ZEBETA Take 10 mg by mouth at bedtime.   doxycycline 100 MG capsule Commonly known as: VIBRAMYCIN Take 1 capsule (100 mg total)  by mouth 2 (two) times daily. What changed:   when to take this  reasons to take this  Another medication with the same name was removed. Continue taking this medication, and follow the directions you see here.   furosemide 20 MG tablet Commonly known as: LASIX Take 20 mg by mouth daily as needed for fluid.   hydrochlorothiazide 25 MG tablet Commonly known as: HYDRODIURIL Take 25 mg by mouth daily.   losartan 100 MG tablet Commonly known as: COZAAR Take 100 mg by mouth daily.   oxyCODONE-acetaminophen 5-325 MG tablet Commonly known as: PERCOCET/ROXICET Take 1 tablet by mouth every 4 (four) hours as needed.   penicillin G  IVPB Inject 24 Million Units into the vein daily. As a continuous infusion. 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   pravastatin 20 MG tablet Commonly known as: PRAVACHOL Take 20 mg by mouth at bedtime.   topiramate 50 MG tablet Commonly known as: TOPAMAX Take 50 mg by mouth 2 (two) times daily.            Durable Medical Equipment  (From admission, onward)         Start     Ordered   09/24/20 1208  For home use only DME 3 n 1  Once       Comments: BARIATRIC 3 IN 1 PLEASE   09/24/20 1208   09/21/20 1426  For home use only DME standard manual wheelchair with seat cushion  Once       Comments: Patient suffers from infected total knee  which impairs their ability to perform daily activities like walking in the home.  A rolling walker will not resolve issue with performing activities of daily living. A wheelchair will allow patient to safely perform daily activities. Patient  can safely propel the wheelchair in the home or has a caregiver who can provide assistance. Length of need 12 months Accessories: elevating leg rests (ELRs), wheel locks, extensions and anti-tippers. Pt needs BARIATRIC wheelchair.   09/21/20 1429           Discharge Care Instructions  (From admission, onward)  Start     Ordered   09/24/20 0000  Change dressing on IV access line weekly and PRN  (Home infusion instructions - [facility] )        09/24/20 0902   09/22/20 0000  Change dressing on IV access line weekly and PRN  (Home infusion instructions - [facility] )        09/22/20 0842   09/22/20 0000  Touch down weight bearing       Question Answer Comment  Laterality left   Extremity Lower      09/22/20 0842          Diagnostic Studies: NM Bone Scan 3 Phase Lower Extremity  Result Date: 08/31/2020 CLINICAL DATA:  Status post left total knee arthroplasty in 0017 complicated by septic arthritis in 2019 now with persistent left knee pain. Right total knee arthroplasty 2020. EXAM: NUCLEAR MEDICINE 3-PHASE BONE SCAN TECHNIQUE: Radionuclide angiographic images, immediate static blood pool images, and 3-hour delayed static images were obtained of the knees bilaterally after intravenous injection of radiopharmaceutical. RADIOPHARMACEUTICALS:  22.0 mCi Tc-370mMDP IV COMPARISON:  Plain radiograph 05/08/2020 and 10/28/2018 FINDINGS: Vascular phase: There is asymmetric increased perfusion diffusely of the left knee. Blood pool phase: There is diffusely increased hyperemia of the left knee surrounding the photopenic defect related to left total knee arthroplasty. No hyperemia involving the right knee. Delayed phase: Delayed images demonstrate diffusely increased uptake rib radiotracer within the distal left femur, patella, and proximal tibia. IMPRESSION: Diffusely increased perfusion, hyperemia, and delayed radiotracer uptake involving the left knee suspicious for  changes of septic arthritis/osteomyelitis. Electronically Signed   By: AFidela SalisburyMD   On: 08/31/2020 13:13   [place] SITE RITE  Result Date: 09/19/2020 If Site Rite image not attached, placement could not be confirmed due to current cardiac rhythm.   Patient benefited maximally from their hospital stay and there were no complications.     Disposition: Discharge disposition: 01-Home or Self Care      Discharge Instructions    [facility] pharmacist to adjust dose for Vancomycin, Aminoglycosides and other anti-infective therapies as requested by physician.   Complete by: As directed    [facility] pharmacist to adjust dose for Vancomycin, Aminoglycosides and other anti-infective therapies as requested by physician.   Complete by: As directed    [facility] to provide Cath Flo 247m  Complete by: As directed    Administer for PICC line occlusion and as ordered by physician for other access device issues.   [facility] to provide Cath Flo 70m4m Complete by: As directed    Administer for PICC line occlusion and as ordered by physician for other access device issues.   Anaphylaxis Kit: Provided to treat any anaphylactic reaction to the medication being provided to the patient if First Dose or when requested by physician   Complete by: As directed    Epinephrine 1mg49m vial / amp: Administer 0.3mg 470m3ml) 23mcutaneously once for moderate to severe anaphylaxis, nurse to call physician and pharmacy when reaction occurs and call 911 if needed for immediate care   Diphenhydramine 50mg/m44m vial: Administer 25-50mg IV69mPRN for first dose reaction, rash, itching, mild reaction, nurse to call physician and pharmacy when reaction occurs   Sodium Chloride 0.9% NS 500ml IV:25minister if needed for hypovolemic blood pressure drop or as ordered by physician after call to physician with anaphylactic reaction   Anaphylaxis Kit: Provided to  treat any  anaphylactic reaction to the medication being provided to the patient if First Dose or when requested by physician   Complete by: As directed    Epinephrine 65m/ml vial / amp: Administer 0.367m(0.8m42msubcutaneously once for moderate to severe anaphylaxis, nurse to call physician and pharmacy when reaction occurs and call 911 if needed for immediate care   Diphenhydramine 65m52m IV vial: Administer 25-65mg49mIM PRN for first dose reaction, rash, itching, mild reaction, nurse to call physician and pharmacy when reaction occurs   Sodium Chloride 0.9% NS 500ml 32mAdminister if needed for hypovolemic blood pressure drop or as ordered by physician after call to physician with anaphylactic reaction   Call MD / Call 911   Complete by: As directed    If you experience chest pain or shortness of breath, CALL 911 and be transported to the hospital emergency room.  If you develope a fever above 101 F, pus (white drainage) or increased drainage or redness at the wound, or calf pain, call your surgeon's office.   Call MD / Call 911   Complete by: As directed    If you experience chest pain or shortness of breath, CALL 911 and be transported to the hospital emergency room.  If you develope a fever above 101 F, pus (white drainage) or increased drainage or redness at the wound, or calf pain, call your surgeon's office.   Change dressing on IV access line weekly and PRN   Complete by: As directed    Change dressing on IV access line weekly and PRN   Complete by: As directed    Constipation Prevention   Complete by: As directed    Drink plenty of fluids.  Prune juice may be helpful.  You may use a stool softener, such as Colace (over the counter) 100 mg twice a day.  Use MiraLax (over the counter) for constipation as needed.   Constipation Prevention   Complete by: As directed    Drink plenty of fluids.  Prune juice may be helpful.  You may use a stool softener, such as Colace (over the counter) 100 mg twice a  day.  Use MiraLax (over the counter) for constipation as needed.   Diet - low sodium heart healthy   Complete by: As directed    Diet - low sodium heart healthy   Complete by: As directed    Flush IV access with Sodium Chloride 0.9% and Heparin 10 units/ml or 100 units/ml   Complete by: As directed    Flush IV access with Sodium Chloride 0.9% and Heparin 10 units/ml or 100 units/ml   Complete by: As directed    Home infusion instructions - [facility]   Complete by: As directed    Instructions: Flush IV access with Sodium Chloride 0.9% and Heparin 10units/ml or 100units/ml   Change dressing on IV access line: Weekly and PRN   Instructions Cath Flo 2mg: A61mnister for PICC Line occlusion and as ordered by physician for other access device   [facility] pharmacist to adjust dose for: Vancomycin, Aminoglycosides and other anti-infective therapies as requested by physician   Home infusion instructions - [facility]   Complete by: As directed    Instructions: Flush IV access with Sodium Chloride 0.9% and Heparin 10units/ml or 100units/ml   Change dressing on IV access line: Weekly and PRN   Instructions Cath Flo 2mg: Ad32mister for PICC Line occlusion and as ordered by physician for other access device  [facility] pharmacist to adjust dose for: Vancomycin, Aminoglycosides and other anti-infective therapies as requested by physician   Increase activity slowly as tolerated   Complete by: As directed    Increase activity slowly as tolerated   Complete by: As directed    Method of administration may be changed at the discretion of home infusion pharmacist based upon assessment of the patient and/or caregiver's ability to self-administer the medication ordered   Complete by: As directed    Method of administration may be changed at the discretion of home infusion pharmacist based upon assessment of the patient and/or caregiver's ability to  self-administer the medication ordered   Complete by: As directed    Touch down weight bearing   Complete by: As directed    Laterality: left   Extremity: Lower      Follow-up Information    Newt Minion, MD In 1 week.   Specialty: Orthopedic Surgery Contact information: [address] [place] [address] [place] [facility].   Why: Nurse, physical and occupational therapist to follow up with you at home.  They will call you to arrange an appointment. Contact information: Phone: [phone]               Signed: Bevely Palmer Yoona Ishii 09/24/2020, 3:34 PM

## 2020-10-03 ENCOUNTER — Ambulatory Visit: Payer: Managed Care, Other (non HMO) | Admitting: Physician Assistant

## 2020-10-03 ENCOUNTER — Inpatient Hospital Stay: Payer: Managed Care, Other (non HMO) | Admitting: Infectious Diseases

## 2020-10-04 ENCOUNTER — Ambulatory Visit (INDEPENDENT_AMBULATORY_CARE_PROVIDER_SITE_OTHER): Payer: Managed Care, Other (non HMO) | Admitting: Infectious Diseases

## 2020-10-04 ENCOUNTER — Encounter: Payer: Self-pay | Admitting: Infectious Diseases

## 2020-10-04 ENCOUNTER — Other Ambulatory Visit: Payer: Self-pay

## 2020-10-04 ENCOUNTER — Ambulatory Visit (INDEPENDENT_AMBULATORY_CARE_PROVIDER_SITE_OTHER): Payer: Managed Care, Other (non HMO) | Admitting: Orthopedic Surgery

## 2020-10-04 DIAGNOSIS — Z452 Encounter for adjustment and management of vascular access device: Secondary | ICD-10-CM | POA: Diagnosis not present

## 2020-10-04 DIAGNOSIS — Z5181 Encounter for therapeutic drug level monitoring: Secondary | ICD-10-CM | POA: Diagnosis not present

## 2020-10-04 DIAGNOSIS — T8450XD Infection and inflammatory reaction due to unspecified internal joint prosthesis, subsequent encounter: Secondary | ICD-10-CM | POA: Diagnosis not present

## 2020-10-04 DIAGNOSIS — T8459XS Infection and inflammatory reaction due to other internal joint prosthesis, sequela: Secondary | ICD-10-CM

## 2020-10-04 DIAGNOSIS — Z96659 Presence of unspecified artificial knee joint: Secondary | ICD-10-CM
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
## 2020-10-08 ENCOUNTER — Encounter: Payer: Self-pay | Admitting: Orthopedic Surgery
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
## 2020-10-09 ENCOUNTER — Encounter: Payer: Self-pay | Admitting: Orthopedic Surgery

## 2020-10-11 ENCOUNTER — Ambulatory Visit (INDEPENDENT_AMBULATORY_CARE_PROVIDER_SITE_OTHER): Payer: Managed Care, Other (non HMO) | Admitting: Orthopedic Surgery

## 2020-10-11 ENCOUNTER — Other Ambulatory Visit: Payer: Self-pay

## 2020-10-11 ENCOUNTER — Ambulatory Visit: Payer: Managed Care, Other (non HMO) | Admitting: Orthopedic Surgery

## 2020-10-11 ENCOUNTER — Encounter: Payer: Self-pay | Admitting: Orthopedic Surgery

## 2020-10-11 DIAGNOSIS — Z96659 Presence of unspecified artificial knee joint: Secondary | ICD-10-CM

## 2020-10-11 DIAGNOSIS — T8459XS Infection and inflammatory reaction due to other internal joint prosthesis, sequela: Secondary | ICD-10-CM
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
## 2020-10-16 ENCOUNTER — Encounter: Payer: Self-pay | Admitting: Orthopedic Surgery

## 2020-10-17 ENCOUNTER — Telehealth: Payer: Self-pay | Admitting: Orthopedic Surgery

## 2020-10-17 NOTE — Telephone Encounter (Signed)
Noted  

## 2020-10-17 NOTE — Telephone Encounter (Signed)
Received call from [facility] with [employer] [facility] [employer] she advised patient declined anymore (OT) at this time. Rosey Bath said patient so not feel that he needs ((OT). Rosey Bath said patient is doing fine. Rosey Bath advised patient is still doing (PT)   The number to contact Rosey Bath is [phone]

## 2020-10-24 ENCOUNTER — Encounter: Payer: Self-pay | Admitting: Family

## 2020-10-24 ENCOUNTER — Ambulatory Visit (INDEPENDENT_AMBULATORY_CARE_PROVIDER_SITE_OTHER): Payer: Managed Care, Other (non HMO) | Admitting: Infectious Diseases

## 2020-10-24 ENCOUNTER — Encounter: Payer: Self-pay | Admitting: Infectious Diseases

## 2020-10-24 ENCOUNTER — Ambulatory Visit (INDEPENDENT_AMBULATORY_CARE_PROVIDER_SITE_OTHER): Payer: Managed Care, Other (non HMO) | Admitting: Family

## 2020-10-24 ENCOUNTER — Other Ambulatory Visit: Payer: Self-pay

## 2020-10-24 VITALS — BP 108/72 | HR 68 | Temp 98.0°F

## 2020-10-24 DIAGNOSIS — T8459XD Infection and inflammatory reaction due to other internal joint prosthesis, subsequent encounter: Secondary | ICD-10-CM

## 2020-10-24 DIAGNOSIS — Z96652 Presence of left artificial knee joint: Secondary | ICD-10-CM

## 2020-10-24 DIAGNOSIS — Z5181 Encounter for therapeutic drug level monitoring: Secondary | ICD-10-CM

## 2020-10-24 DIAGNOSIS — T8450XD Infection and inflammatory reaction due to unspecified internal joint prosthesis, subsequent encounter: Secondary | ICD-10-CM

## 2020-10-24 DIAGNOSIS — Z452 Encounter for adjustment and management of vascular access device: Secondary | ICD-10-CM | POA: Diagnosis not present

## 2020-10-24 MED ORDER — AMOXICILLIN 500 MG PO CAPS: 500.0000 mg | ORAL_CAPSULE | Freq: Three times a day (TID) | ORAL | 0 refills | Status: DC

## 2020-10-24 MED ORDER — FLUCONAZOLE 200 MG PO TABS: 100.0000 mg | ORAL_TABLET | ORAL | 0 refills | Status: DC
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
## 2020-10-25 ENCOUNTER — Ambulatory Visit: Payer: Managed Care, Other (non HMO) | Admitting: Orthopedic Surgery

## 2020-10-25 ENCOUNTER — Telehealth: Payer: Self-pay

## 2020-10-25 NOTE — Telephone Encounter (Signed)
-----   Message from Odette Fraction, MD sent at 10/24/2020  6:29 PM EDT ----- Regarding: End date for antibiotics Would you please let Home health know that end date for IV penicillin is 4/6. PICC line can come out on 4/7. Thanks.

## 2020-10-25 NOTE — Telephone Encounter (Signed)
Called [facility] with orders to stop antibiotics and pull picc on 4/6. Spoke with Jorja Loa who was able to take orders. Orders were repeated and confirmed.

## 2020-11-02 LAB — ACID FAST CULTURE WITH REFLEXED SENSITIVITIES (MYCOBACTERIA): Acid Fast Culture: NEGATIVE

## 2020-11-08 ENCOUNTER — Encounter: Payer: Self-pay | Admitting: Infectious Diseases

## 2020-11-08 ENCOUNTER — Ambulatory Visit (INDEPENDENT_AMBULATORY_CARE_PROVIDER_SITE_OTHER): Payer: Managed Care, Other (non HMO) | Admitting: Infectious Diseases

## 2020-11-08 ENCOUNTER — Ambulatory Visit (INDEPENDENT_AMBULATORY_CARE_PROVIDER_SITE_OTHER): Payer: Managed Care, Other (non HMO) | Admitting: Orthopedic Surgery

## 2020-11-08 ENCOUNTER — Encounter: Payer: Self-pay | Admitting: Orthopedic Surgery

## 2020-11-08 ENCOUNTER — Other Ambulatory Visit: Payer: Self-pay

## 2020-11-08 VITALS — BP 118/71 | HR 68 | Temp 98.3°F

## 2020-11-08 DIAGNOSIS — T8459XS Infection and inflammatory reaction due to other internal joint prosthesis, sequela: Secondary | ICD-10-CM

## 2020-11-08 DIAGNOSIS — Z96659 Presence of unspecified artificial knee joint: Secondary | ICD-10-CM

## 2020-11-08 DIAGNOSIS — T8450XD Infection and inflammatory reaction due to unspecified internal joint prosthesis, subsequent encounter: Secondary | ICD-10-CM | POA: Diagnosis not present

## 2020-11-08 DIAGNOSIS — Z5181 Encounter for therapeutic drug level monitoring: Secondary | ICD-10-CM

## 2020-11-08 DIAGNOSIS — Z96652 Presence of left artificial knee joint: Secondary | ICD-10-CM
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
## 2020-11-09 LAB — CBC
HCT: 44.8 % (ref 38.5–50.0)
Hemoglobin: 14.5 g/dL (ref 13.2–17.1)
MCH: 27.3 pg (ref 27.0–33.0)
MCHC: 32.4 g/dL (ref 32.0–36.0)
MCV: 84.2 fL (ref 80.0–100.0)
MPV: 10.5 fL (ref 7.5–12.5)
Platelets: 261 10*3/uL (ref 140–400)
RBC: 5.32 10*6/uL (ref 4.20–5.80)
RDW: 14 % (ref 11.0–15.0)
WBC: 6.2 10*3/uL (ref 3.8–10.8)

## 2020-11-09 LAB — BASIC METABOLIC PANEL
BUN: 22 mg/dL (ref 7–25)
CO2: 27 mmol/L (ref 20–32)
Calcium: 9.6 mg/dL (ref 8.6–10.3)
Chloride: 103 mmol/L (ref 98–110)
Creat: 1.09 mg/dL (ref 0.70–1.33)
Glucose, Bld: 105 mg/dL — ABNORMAL HIGH (ref 65–99)
Potassium: 3.8 mmol/L (ref 3.5–5.3)
Sodium: 140 mmol/L (ref 135–146)

## 2020-11-09 LAB — C-REACTIVE PROTEIN: CRP: 5 mg/L (ref <8.0)

## 2020-11-09 LAB — SEDIMENTATION RATE: Sed Rate: 14 mm/h (ref 0–20)

## 2020-11-12 ENCOUNTER — Encounter: Payer: Self-pay | Admitting: Orthopedic Surgery
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
## 2020-11-19 ENCOUNTER — Other Ambulatory Visit: Payer: Self-pay | Admitting: Orthopedic Surgery

## 2020-11-19 DIAGNOSIS — Z96651 Presence of right artificial knee joint: Secondary | ICD-10-CM

## 2020-11-30 ENCOUNTER — Other Ambulatory Visit: Payer: Self-pay

## 2020-11-30 ENCOUNTER — Ambulatory Visit (INDEPENDENT_AMBULATORY_CARE_PROVIDER_SITE_OTHER): Payer: Managed Care, Other (non HMO) | Admitting: Infectious Diseases

## 2020-11-30 VITALS — Temp 97.9°F

## 2020-11-30 DIAGNOSIS — Z5181 Encounter for therapeutic drug level monitoring: Secondary | ICD-10-CM

## 2020-11-30 DIAGNOSIS — T8450XD Infection and inflammatory reaction due to unspecified internal joint prosthesis, subsequent encounter: Secondary | ICD-10-CM | POA: Diagnosis not present
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
## 2020-12-27 ENCOUNTER — Ambulatory Visit: Payer: Managed Care, Other (non HMO) | Admitting: Orthopedic Surgery

## 2020-12-31 ENCOUNTER — Encounter: Payer: Self-pay | Admitting: Orthopedic Surgery

## 2020-12-31 ENCOUNTER — Other Ambulatory Visit: Payer: Self-pay

## 2020-12-31 ENCOUNTER — Ambulatory Visit (INDEPENDENT_AMBULATORY_CARE_PROVIDER_SITE_OTHER): Payer: Managed Care, Other (non HMO) | Admitting: Orthopedic Surgery

## 2020-12-31 DIAGNOSIS — T8459XS Infection and inflammatory reaction due to other internal joint prosthesis, sequela: Secondary | ICD-10-CM | POA: Diagnosis not present

## 2020-12-31 DIAGNOSIS — Z96652 Presence of left artificial knee joint: Secondary | ICD-10-CM

## 2020-12-31 DIAGNOSIS — M25562 Pain in left knee: Secondary | ICD-10-CM
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
## 2021-01-07 ENCOUNTER — Telehealth: Payer: Self-pay | Admitting: Orthopedic Surgery

## 2021-01-07 NOTE — Telephone Encounter (Signed)
Pts wife Pam called stating the pt is supposed to be scheduled for surgery but they haven't been told a date. Pam would like a CB ASAP to get this info as she states it's been a week since they left a message for [place].   [phone]

## 2021-01-08 NOTE — Telephone Encounter (Signed)
I called and left a message for Ms. Gracie to return my call to discuss scheduling surgery.

## 2021-01-14 ENCOUNTER — Other Ambulatory Visit: Payer: Self-pay

## 2021-01-15 ENCOUNTER — Other Ambulatory Visit: Payer: Self-pay | Admitting: Physician Assistant

## 2021-01-21 NOTE — Pre-Procedure Instructions (Addendum)
Surgical Instructions    Your procedure is scheduled on Friday July 1st.  Report to [facility] Main Entrance "A" at 05:30 A.M., then check in with the Admitting office.  Call this number if you have problems the morning of surgery:  [phone]   If you have any questions prior to your surgery date call [phone]: Open Monday-Friday 8am-4pm    Remember:  Do not eat after midnight the night before your surgery  You may drink clear liquids until 04:30 A.M. the morning of your surgery.   Clear liquids allowed are: Water, Non-Citrus Juices (without pulp), Carbonated Beverages, Clear Tea, Black Coffee Only, and Gatorade Patient Instructions  The night before surgery:  No food after midnight. ONLY clear liquids after midnight  The day of surgery (if you do NOT have diabetes):  Drink ONE (1) Pre-Surgery Clear Ensure by 04:30 A.M. the morning of surgery. Drink in one sitting. Do not sip.  This drink was given to you during your hospital  pre-op appointment visit.  Nothing else to drink after completing the  Pre-Surgery Clear Ensure.         If you have questions, please contact your surgeon's office.     Take these medicines the morning of surgery with A SIP OF WATER   acetaminophen (TYLENOL)- If needed  topiramate (TOPAMAX)   As of today, STOP taking any Aspirin (unless otherwise instructed by your surgeon) Aleve, Naproxen, Ibuprofen, Motrin, Advil, Goody's, BC's, all herbal medications, fish oil, and all vitamins.                     Do NOT Smoke (Tobacco/Vaping) or drink Alcohol 24 hours prior to your procedure.  If you use a CPAP at night, you may bring all equipment for your overnight stay.   Contacts, glasses, piercing's, hearing aid's, dentures or partials may not be worn into surgery, please bring cases for these belongings.    For patients admitted to the hospital, discharge time will be determined by your treatment team.   Patients discharged the day of surgery  will not be allowed to drive home, and someone needs to stay with them for 24 hours.  ONLY 1 SUPPORT PERSON MAY BE PRESENT WHILE YOU ARE IN SURGERY. IF YOU ARE TO BE ADMITTED ONCE YOU ARE IN YOUR ROOM YOU WILL BE ALLOWED TWO (2) VISITORS.  Minor children may have two parents present. Special consideration for safety and communication needs will be reviewed on a case by case basis.   Special instructions:   [place]- Preparing For Surgery  Before surgery, you can play an important role. Because skin is not sterile, your skin needs to be as free of germs as possible. You can reduce the number of germs on your skin by washing with CHG (chlorahexidine gluconate) Soap before surgery.  CHG is an antiseptic cleaner which kills germs and bonds with the skin to continue killing germs even after washing.    Oral Hygiene is also important to reduce your risk of infection.  Remember - BRUSH YOUR TEETH THE MORNING OF SURGERY WITH YOUR REGULAR TOOTHPASTE  Please do not use if you have an allergy to CHG or antibacterial soaps. If your skin becomes reddened/irritated stop using the CHG.  Do not shave (including legs and underarms) for at least 48 hours prior to first CHG shower. It is OK to shave your face.  Please follow these instructions carefully.   Shower the NIGHT BEFORE SURGERY and the MORNING OF  SURGERY  If you chose to wash your hair, wash your hair first as usual with your normal shampoo.  After you shampoo, rinse your hair and body thoroughly to remove the shampoo.  Use CHG Soap as you would any other liquid soap. You can apply CHG directly to the skin and wash gently with a scrungie or a clean washcloth.   Apply the CHG Soap to your body ONLY FROM THE NECK DOWN.  Do not use on open wounds or open sores. Avoid contact with your eyes, ears, mouth and genitals (private parts). Wash Face and genitals (private parts)  with your normal soap.   Wash thoroughly, paying special attention to the area  where your surgery will be performed.  Thoroughly rinse your body with warm water from the neck down.  DO NOT shower/wash with your normal soap after using and rinsing off the CHG Soap.  Pat yourself dry with a CLEAN TOWEL.  Wear CLEAN PAJAMAS to bed the night before surgery  Place CLEAN SHEETS on your bed the night before your surgery  DO NOT SLEEP WITH PETS.   Day of Surgery: Shower with CHG soap. Do not wear jewelry, make up, nail polish, gel polish, artificial nails, or any other type of covering on natural nails including finger and toenails. If patients have artificial nails, gel coating, etc. that need to be removed by a nail salon please have this removed prior to surgery. Surgery may need to be canceled/delayed if the surgeon/ anesthesia feels like the patient is unable to be adequately monitored. Do not wear lotions, powders, perfumes/colognes, or deodorant. Do not shave 48 hours prior to surgery.  Men may shave face and neck. Do not bring valuables to the hospital. [facility] is not responsible for any belongings or valuables. Wear Clean/Comfortable clothing the morning of surgery Remember to brush your teeth WITH YOUR REGULAR TOOTHPASTE.   Please read over the following fact sheets that you were given.

## 2021-01-22 ENCOUNTER — Other Ambulatory Visit: Payer: Self-pay

## 2021-01-22 ENCOUNTER — Encounter (HOSPITAL_COMMUNITY): Payer: Self-pay

## 2021-01-22 ENCOUNTER — Encounter (HOSPITAL_COMMUNITY)
Admission: RE | Admit: 2021-01-22 | Discharge: 2021-01-22 | Disposition: A | Discharge status: Home / Self Care | Payer: Managed Care, Other (non HMO) | Source: Ambulatory Visit | Attending: Orthopedic Surgery | Admitting: Orthopedic Surgery

## 2021-01-22 DIAGNOSIS — Z01812 Encounter for preprocedural laboratory examination: Secondary | ICD-10-CM | POA: Diagnosis present

## 2021-01-22 DIAGNOSIS — Z20822 Contact with and (suspected) exposure to covid-19: Secondary | ICD-10-CM | POA: Insufficient documentation

## 2021-01-22 LAB — BASIC METABOLIC PANEL
Anion gap: 10 (ref 5–15)
BUN: 15 mg/dL (ref 6–20)
CO2: 27 mmol/L (ref 22–32)
Calcium: 9.6 mg/dL (ref 8.9–10.3)
Chloride: 100 mmol/L (ref 98–111)
Creatinine, Ser: 1.24 mg/dL (ref 0.61–1.24)
GFR, Estimated: >60 mL/min (ref >60)
Glucose, Bld: 119 mg/dL — ABNORMAL HIGH (ref 70–99)
Potassium: 3.4 mmol/L — ABNORMAL LOW (ref 3.5–5.1)
Sodium: 137 mmol/L (ref 135–145)

## 2021-01-22 LAB — CBC
HCT: 50.8 % (ref 39.0–52.0)
Hemoglobin: 16.6 g/dL (ref 13.0–17.0)
MCH: 27.6 pg (ref 26.0–34.0)
MCHC: 32.7 g/dL (ref 30.0–36.0)
MCV: 84.4 fL (ref 80.0–100.0)
Platelets: 236 10*3/uL (ref 150–400)
RBC: 6.02 MIL/uL — ABNORMAL HIGH (ref 4.22–5.81)
RDW: 14 % (ref 11.5–15.5)
WBC: 7.7 10*3/uL (ref 4.0–10.5)
nRBC: 0 % (ref 0.0–0.2)

## 2021-01-22 LAB — SURGICAL PCR SCREEN
MRSA, PCR: NEGATIVE
Staphylococcus aureus: NEGATIVE

## 2021-01-22 LAB — SARS CORONAVIRUS 2 (TAT 6-24 HRS): SARS Coronavirus 2: NEGATIVE

## 2021-01-22 NOTE — Pre-Procedure Instructions (Addendum)
Surgical Instructions    Your procedure is scheduled on  July 6th.  Report to [facility] Main Entrance "A" at 07:30 A.M., then check in with the Admitting office.  Call this number if you have problems the morning of surgery:  [phone]   If you have any questions prior to your surgery date call [phone]: Open Monday-Friday 8am-4pm    Remember:  Do not eat after midnight the night before your surgery  You may drink clear liquids until 06:30 A.M. the morning of your surgery.   Clear liquids allowed are: Water, Non-Citrus Juices (without pulp), Carbonated Beverages, Clear Tea, Black Coffee Only, and Gatorade Patient Instructions  The night before surgery:  No food after midnight. ONLY clear liquids after midnight  The day of surgery (if you do NOT have diabetes):  Drink ONE (1) Pre-Surgery Clear Ensure by 06:30 A.M. the morning of surgery. Drink in one sitting. Do not sip.  This drink was given to you during your hospital  pre-op appointment visit. Nothing else to drink after completing the  Pre-Surgery Clear Ensure.         If you have questions, please contact your surgeon's office.     Take these medicines the morning of surgery with A SIP OF WATER   acetaminophen (TYLENOL)- If needed  topiramate (TOPAMAX)   As of today, STOP taking any Aspirin (unless otherwise instructed by your surgeon) Aleve, Naproxen, Ibuprofen, Motrin, Advil, Goody's, BC's, all herbal medications, fish oil, and all vitamins.                     Do NOT Smoke (Tobacco/Vaping) or drink Alcohol 24 hours prior to your procedure.  If you use a CPAP at night, you may bring all equipment for your overnight stay.   Contacts, glasses, piercing's, hearing aid's, dentures or partials may not be worn into surgery, please bring cases for these belongings.    For patients admitted to the hospital, discharge time will be determined by your treatment team.   Patients discharged the day of surgery will not  be allowed to drive home, and someone needs to stay with them for 24 hours.  ONLY 1 SUPPORT PERSON MAY BE PRESENT WHILE YOU ARE IN SURGERY. IF YOU ARE TO BE ADMITTED ONCE YOU ARE IN YOUR ROOM YOU WILL BE ALLOWED TWO (2) VISITORS.  Minor children may have two parents present. Special consideration for safety and communication needs will be reviewed on a case by case basis.   Special instructions:   [place]- Preparing For Surgery  Before surgery, you can play an important role. Because skin is not sterile, your skin needs to be as free of germs as possible. You can reduce the number of germs on your skin by washing with CHG (chlorahexidine gluconate) Soap before surgery.  CHG is an antiseptic cleaner which kills germs and bonds with the skin to continue killing germs even after washing.    Oral Hygiene is also important to reduce your risk of infection.  Remember - BRUSH YOUR TEETH THE MORNING OF SURGERY WITH YOUR REGULAR TOOTHPASTE  Please do not use if you have an allergy to CHG or antibacterial soaps. If your skin becomes reddened/irritated stop using the CHG.  Do not shave (including legs and underarms) for at least 48 hours prior to first CHG shower. It is OK to shave your face.  Please follow these instructions carefully.   Shower the NIGHT BEFORE SURGERY and the MORNING OF SURGERY  If you chose to wash your hair, wash your hair first as usual with your normal shampoo.  After you shampoo, rinse your hair and body thoroughly to remove the shampoo.  Use CHG Soap as you would any other liquid soap. You can apply CHG directly to the skin and wash gently with a scrungie or a clean washcloth.   Apply the CHG Soap to your body ONLY FROM THE NECK DOWN.  Do not use on open wounds or open sores. Avoid contact with your eyes, ears, mouth and genitals (private parts). Wash Face and genitals (private parts)  with your normal soap.   Wash thoroughly, paying special attention to the area where  your surgery will be performed.  Thoroughly rinse your body with warm water from the neck down.  DO NOT shower/wash with your normal soap after using and rinsing off the CHG Soap.  Pat yourself dry with a CLEAN TOWEL.  Wear CLEAN PAJAMAS to bed the night before surgery  Place CLEAN SHEETS on your bed the night before your surgery  DO NOT SLEEP WITH PETS.   Day of Surgery: Shower with CHG soap. Do not wear jewelry, make up, nail polish, gel polish, artificial nails, or any other type of covering on natural nails including finger and toenails. If patients have artificial nails, gel coating, etc. that need to be removed by a nail salon please have this removed prior to surgery. Surgery may need to be canceled/delayed if the surgeon/ anesthesia feels like the patient is unable to be adequately monitored. Do not wear lotions, powders, perfumes/colognes, or deodorant. Do not shave 48 hours prior to surgery.  Men may shave face and neck. Do not bring valuables to the hospital. [facility] is not responsible for any belongings or valuables. Wear Clean/Comfortable clothing the morning of surgery Remember to brush your teeth WITH YOUR REGULAR TOOTHPASTE.   Please read over the following fact sheets that you were given.

## 2021-01-22 NOTE — Progress Notes (Signed)
PCP - Aggie Cosier, MD Cardiologist - Celso Sickle Pulmonologist: Kern Reap  PPM/ICD - denies   Chest x-ray - n/a EKG - 09/17/20 Stress Test - over 10 years ago, records requested ECHO - denies Cardiac Cath - denies  Sleep Study - yes, records requested CPAP - does not wear nightly, settings unknown  No diabetes  As of today, STOP taking any Aspirin (unless otherwise instructed by your surgeon) Aleve, Naproxen, Ibuprofen, Motrin, Advil, Goody's, BC's, all herbal medications, fish oil, and all vitamins.  ERAS Protcol -yes PRE-SURGERY Ensure or G2- ensure given  COVID TEST- 01/22/21 in PAT   Anesthesia review: yes, sees a cardiologist in [place], [place]. Records requested.  He has a history of aortic aneurysm and after he left the hospital he was told to follow up with cardiologist.   Patient denies shortness of breath, fever, cough and chest pain at PAT appointment   All instructions explained to the patient, with a verbal understanding of the material. Patient agrees to go over the instructions while at home for a better understanding. Patient also instructed to self quarantine after being tested for COVID-19. The opportunity to ask questions was provided.

## 2021-01-22 NOTE — Progress Notes (Signed)
PCP -  Cardiologist - Celso Sickle Pulmonologist: Kern Reap  PPM/ICD - denies   Chest x-ray -  EKG - 09/17/20 Stress Test - over 10 years ago ECHO - denies Cardiac Cath - denies  Sleep Study - yes, records requested CPAP - does not wear nightly, settings unknown  No diabetes  As of today, STOP taking any Aspirin (unless otherwise instructed by your surgeon) Aleve, Naproxen, Ibuprofen, Motrin, Advil, Goody's, BC's, all herbal medications, fish oil, and all vitamins.  ERAS Protcol -yes PRE-SURGERY Ensure or G2- ensure given  COVID TEST- 01/22/21 in PAT   Anesthesia review: yes, sees a cardiologist in [place], [place]. Records requested.  He has a history of aortic aneurysm and after he left the hospital he was told to follow up with cardiologist.   Patient denies shortness of breath, fever, cough and chest pain at PAT appointment   All instructions explained to the patient, with a verbal understanding of the material. Patient agrees to go over the instructions while at home for a better understanding. Patient also instructed to self quarantine after being tested for COVID-19. The opportunity to ask questions was provided.
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
## 2021-01-30 ENCOUNTER — Inpatient Hospital Stay (HOSPITAL_COMMUNITY): Payer: Managed Care, Other (non HMO) | Admitting: Emergency Medicine

## 2021-01-30 ENCOUNTER — Inpatient Hospital Stay (HOSPITAL_COMMUNITY): Payer: Managed Care, Other (non HMO)

## 2021-01-30 ENCOUNTER — Encounter (HOSPITAL_COMMUNITY)
Admission: RE | Disposition: A | Discharge status: Home / Self Care | Payer: Self-pay | Source: Home / Self Care | Attending: Orthopedic Surgery

## 2021-01-30 ENCOUNTER — Other Ambulatory Visit: Payer: Self-pay

## 2021-01-30 ENCOUNTER — Inpatient Hospital Stay (HOSPITAL_COMMUNITY)
Admission: RE | Admit: 2021-01-30 | Discharge: 2021-02-01 | DRG: 467 | Disposition: A | Discharge status: Home / Self Care | Payer: Managed Care, Other (non HMO) | Attending: Orthopedic Surgery | Admitting: Orthopedic Surgery

## 2021-01-30 ENCOUNTER — Encounter (HOSPITAL_COMMUNITY): Payer: Self-pay | Admitting: Orthopedic Surgery

## 2021-01-30 DIAGNOSIS — Z87891 Personal history of nicotine dependence: Secondary | ICD-10-CM | POA: Diagnosis not present

## 2021-01-30 DIAGNOSIS — M66262 Spontaneous rupture of extensor tendons, left lower leg: Secondary | ICD-10-CM | POA: Diagnosis not present

## 2021-01-30 DIAGNOSIS — Z20822 Contact with and (suspected) exposure to covid-19: Secondary | ICD-10-CM | POA: Diagnosis present

## 2021-01-30 DIAGNOSIS — Z6841 Body Mass Index (BMI) 40.0 and over, adult: Secondary | ICD-10-CM

## 2021-01-30 DIAGNOSIS — T84093A Other mechanical complication of internal left knee prosthesis, initial encounter: Principal | ICD-10-CM

## 2021-01-30 DIAGNOSIS — Z79899 Other long term (current) drug therapy: Secondary | ICD-10-CM | POA: Diagnosis not present

## 2021-01-30 DIAGNOSIS — T84093D Other mechanical complication of internal left knee prosthesis, subsequent encounter: Secondary | ICD-10-CM

## 2021-01-30 DIAGNOSIS — M669 Spontaneous rupture of unspecified tendon: Secondary | ICD-10-CM | POA: Diagnosis present

## 2021-01-30 DIAGNOSIS — E785 Hyperlipidemia, unspecified: Secondary | ICD-10-CM | POA: Diagnosis present

## 2021-01-30 DIAGNOSIS — Z96652 Presence of left artificial knee joint: Secondary | ICD-10-CM | POA: Diagnosis not present

## 2021-01-30 DIAGNOSIS — I10 Essential (primary) hypertension: Secondary | ICD-10-CM | POA: Diagnosis present

## 2021-01-30 DIAGNOSIS — M25562 Pain in left knee: Secondary | ICD-10-CM

## 2021-01-30 DIAGNOSIS — Z96659 Presence of unspecified artificial knee joint: Secondary | ICD-10-CM

## 2021-01-30 DIAGNOSIS — Y792 Prosthetic and other implants, materials and accessory orthopedic devices associated with adverse incidents: Secondary | ICD-10-CM | POA: Diagnosis present

## 2021-01-30 DIAGNOSIS — T84018S Broken internal joint prosthesis, other site, sequela: Secondary | ICD-10-CM

## 2021-01-30 DIAGNOSIS — Z96651 Presence of right artificial knee joint: Secondary | ICD-10-CM | POA: Diagnosis present

## 2021-01-30 DIAGNOSIS — M170 Bilateral primary osteoarthritis of knee: Secondary | ICD-10-CM | POA: Diagnosis present

## 2021-01-30 HISTORY — PX: TOTAL KNEE REVISION: SHX996

## 2021-01-30 LAB — SARS CORONAVIRUS 2 (TAT 6-24 HRS): SARS Coronavirus 2: NEGATIVE

## 2021-01-30 SURGERY — TOTAL KNEE REVISION
Anesthesia: Regional | Site: Knee | Laterality: Left

## 2021-01-30 MED ORDER — ORAL CARE MOUTH RINSE: 15.0000 mL | Freq: Once | OROMUCOSAL | Status: AC

## 2021-01-30 MED ORDER — PROPOFOL 10 MG/ML IV BOLUS
INTRAVENOUS | Status: DC | PRN
  Administered 2021-01-30: 250 mg via INTRAVENOUS

## 2021-01-30 MED ORDER — METOCLOPRAMIDE HCL 5 MG PO TABS: 5.0000 mg | ORAL_TABLET | Freq: Three times a day (TID) | ORAL | Status: DC | PRN

## 2021-01-30 MED ORDER — ONDANSETRON HCL 4 MG/2ML IJ SOLN: 4.0000 mg | Freq: Four times a day (QID) | INTRAMUSCULAR | Status: DC | PRN

## 2021-01-30 MED ORDER — ACETAMINOPHEN 10 MG/ML IV SOLN
INTRAVENOUS | Status: AC
  Filled 2021-01-30: qty 100

## 2021-01-30 MED ORDER — ASPIRIN EC 325 MG PO TBEC
325.0000 mg | DELAYED_RELEASE_TABLET | Freq: Every day | ORAL | Status: DC
  Administered 2021-01-31 – 2021-02-01 (×2): 325 mg via ORAL
  Filled 2021-01-30 (×2): qty 1

## 2021-01-30 MED ORDER — ACETAMINOPHEN 325 MG PO TABS: 325.0000 mg | ORAL_TABLET | Freq: Four times a day (QID) | ORAL | Status: DC | PRN

## 2021-01-30 MED ORDER — ROPIVACAINE HCL 5 MG/ML IJ SOLN
INTRAMUSCULAR | Status: DC | PRN
  Administered 2021-01-30: 30 mL via PERINEURAL

## 2021-01-30 MED ORDER — MENTHOL 3 MG MT LOZG: 1.0000 | LOZENGE | OROMUCOSAL | Status: DC | PRN

## 2021-01-30 MED ORDER — PROPOFOL 10 MG/ML IV BOLUS
INTRAVENOUS | Status: AC
  Filled 2021-01-30: qty 20

## 2021-01-30 MED ORDER — TOPIRAMATE 25 MG PO TABS
50.0000 mg | ORAL_TABLET | Freq: Two times a day (BID) | ORAL | Status: DC
  Administered 2021-01-30 – 2021-02-01 (×4): 50 mg via ORAL
  Filled 2021-01-30 (×4): qty 2

## 2021-01-30 MED ORDER — CHLORHEXIDINE GLUCONATE 0.12 % MT SOLN
15.0000 mL | Freq: Once | OROMUCOSAL | Status: AC
  Administered 2021-01-30: 15 mL via OROMUCOSAL
  Filled 2021-01-30: qty 15

## 2021-01-30 MED ORDER — LOSARTAN POTASSIUM 50 MG PO TABS
100.0000 mg | ORAL_TABLET | Freq: Every day | ORAL | Status: DC
  Administered 2021-01-30: 100 mg via ORAL
  Filled 2021-01-30 (×2): qty 2

## 2021-01-30 MED ORDER — MIDAZOLAM HCL 2 MG/2ML IJ SOLN
INTRAMUSCULAR | Status: AC
  Administered 2021-01-30: 2 mg via INTRAVENOUS
  Filled 2021-01-30: qty 2

## 2021-01-30 MED ORDER — KETAMINE HCL 50 MG/5ML IJ SOSY
PREFILLED_SYRINGE | INTRAMUSCULAR | Status: AC
  Filled 2021-01-30: qty 5

## 2021-01-30 MED ORDER — PHENYLEPHRINE 40 MCG/ML (10ML) SYRINGE FOR IV PUSH (FOR BLOOD PRESSURE SUPPORT)
PREFILLED_SYRINGE | INTRAVENOUS | Status: DC | PRN
  Administered 2021-01-30: 200 ug via INTRAVENOUS

## 2021-01-30 MED ORDER — STERILE WATER FOR IRRIGATION IR SOLN
Status: DC | PRN
  Administered 2021-01-30: 1000 mL

## 2021-01-30 MED ORDER — PHENYLEPHRINE HCL (PRESSORS) 10 MG/ML IV SOLN
INTRAVENOUS | Status: DC | PRN
  Administered 2021-01-30: 120 ug via INTRAVENOUS

## 2021-01-30 MED ORDER — SODIUM CHLORIDE 0.9 % IV SOLN: INTRAVENOUS | Status: DC

## 2021-01-30 MED ORDER — BISOPROLOL FUMARATE 10 MG PO TABS
10.0000 mg | ORAL_TABLET | Freq: Every day | ORAL | Status: DC
  Administered 2021-01-30: 10 mg via ORAL
  Filled 2021-01-30 (×3): qty 1

## 2021-01-30 MED ORDER — CEFAZOLIN IN SODIUM CHLORIDE 3-0.9 GM/100ML-% IV SOLN
3.0000 g | INTRAVENOUS | Status: AC
  Administered 2021-01-30: 3 g via INTRAVENOUS
  Filled 2021-01-30: qty 100

## 2021-01-30 MED ORDER — TRANEXAMIC ACID 1000 MG/10ML IV SOLN
2000.0000 mg | Freq: Once | INTRAVENOUS | Status: AC
  Administered 2021-01-30: 1000 mg via TOPICAL
  Filled 2021-01-30: qty 20

## 2021-01-30 MED ORDER — MIDAZOLAM HCL 2 MG/2ML IJ SOLN: 2.0000 mg | Freq: Once | INTRAMUSCULAR | Status: AC

## 2021-01-30 MED ORDER — METOCLOPRAMIDE HCL 5 MG/ML IJ SOLN
5.0000 mg | Freq: Three times a day (TID) | INTRAMUSCULAR | Status: DC | PRN
Start: 2021-01-30 — End: 2021-02-01

## 2021-01-30 MED ORDER — MUPIROCIN 2 % EX OINT
TOPICAL_OINTMENT | CUTANEOUS | Status: AC
  Filled 2021-01-30: qty 22

## 2021-01-30 MED ORDER — ALBUMIN HUMAN 5 % IV SOLN: INTRAVENOUS | Status: DC | PRN

## 2021-01-30 MED ORDER — ACETAMINOPHEN 10 MG/ML IV SOLN
INTRAVENOUS | Status: DC | PRN
  Administered 2021-01-30: 1000 mg via INTRAVENOUS

## 2021-01-30 MED ORDER — KETAMINE HCL 10 MG/ML IJ SOLN
INTRAMUSCULAR | Status: DC | PRN
  Administered 2021-01-30: 50 mg via INTRAVENOUS

## 2021-01-30 MED ORDER — TRANEXAMIC ACID-NACL 1000-0.7 MG/100ML-% IV SOLN
1000.0000 mg | Freq: Once | INTRAVENOUS | Status: AC
  Administered 2021-01-30: 1000 mg via INTRAVENOUS
  Filled 2021-01-30: qty 100

## 2021-01-30 MED ORDER — LACTATED RINGERS IV SOLN: INTRAVENOUS | Status: DC

## 2021-01-30 MED ORDER — HYDROCHLOROTHIAZIDE 25 MG PO TABS
25.0000 mg | ORAL_TABLET | Freq: Every day | ORAL | Status: DC
  Administered 2021-01-30: 25 mg via ORAL
  Filled 2021-01-30 (×2): qty 1

## 2021-01-30 MED ORDER — ONDANSETRON HCL 4 MG/2ML IJ SOLN
INTRAMUSCULAR | Status: DC | PRN
  Administered 2021-01-30: 4 mg via INTRAVENOUS

## 2021-01-30 MED ORDER — FENTANYL CITRATE (PF) 250 MCG/5ML IJ SOLN
INTRAMUSCULAR | Status: AC
  Filled 2021-01-30: qty 5

## 2021-01-30 MED ORDER — FENTANYL CITRATE (PF) 100 MCG/2ML IJ SOLN
INTRAMUSCULAR | Status: AC
  Administered 2021-01-30: 50 ug via INTRAVENOUS
  Filled 2021-01-30: qty 2

## 2021-01-30 MED ORDER — OXYCODONE HCL 5 MG PO TABS
10.0000 mg | ORAL_TABLET | ORAL | Status: DC | PRN
  Administered 2021-01-30 (×2): 10 mg via ORAL
  Administered 2021-01-31 (×2): 15 mg via ORAL
  Filled 2021-01-30: qty 3
  Filled 2021-01-30: qty 2
  Filled 2021-01-30: qty 3
  Filled 2021-01-30 (×2): qty 2

## 2021-01-30 MED ORDER — PHENYLEPHRINE HCL-NACL 10-0.9 MG/250ML-% IV SOLN
INTRAVENOUS | Status: DC | PRN
  Administered 2021-01-30: 20 ug/min via INTRAVENOUS

## 2021-01-30 MED ORDER — HYDROMORPHONE HCL 1 MG/ML IJ SOLN: 0.5000 mg | INTRAMUSCULAR | Status: DC | PRN

## 2021-01-30 MED ORDER — TRANEXAMIC ACID-NACL 1000-0.7 MG/100ML-% IV SOLN
INTRAVENOUS | Status: AC
  Filled 2021-01-30: qty 100

## 2021-01-30 MED ORDER — LIDOCAINE HCL (CARDIAC) PF 100 MG/5ML IV SOSY
PREFILLED_SYRINGE | INTRAVENOUS | Status: DC | PRN
  Administered 2021-01-30: 50 mg via INTRAVENOUS

## 2021-01-30 MED ORDER — CEFAZOLIN SODIUM-DEXTROSE 2-4 GM/100ML-% IV SOLN
2.0000 g | Freq: Four times a day (QID) | INTRAVENOUS | Status: AC
  Administered 2021-01-30 (×2): 2 g via INTRAVENOUS
  Filled 2021-01-30 (×2): qty 100

## 2021-01-30 MED ORDER — PHENOL 1.4 % MT LIQD: 1.0000 | OROMUCOSAL | Status: DC | PRN

## 2021-01-30 MED ORDER — PRAVASTATIN SODIUM 40 MG PO TABS
20.0000 mg | ORAL_TABLET | Freq: Every day | ORAL | Status: DC
  Administered 2021-01-30 – 2021-01-31 (×2): 20 mg via ORAL
  Filled 2021-01-30 (×2): qty 1

## 2021-01-30 MED ORDER — FENTANYL CITRATE (PF) 100 MCG/2ML IJ SOLN
INTRAMUSCULAR | Status: AC
  Filled 2021-01-30: qty 2

## 2021-01-30 MED ORDER — KETOROLAC TROMETHAMINE 15 MG/ML IJ SOLN: 15.0000 mg | Freq: Four times a day (QID) | INTRAMUSCULAR | Status: DC

## 2021-01-30 MED ORDER — FENTANYL CITRATE (PF) 100 MCG/2ML IJ SOLN
INTRAMUSCULAR | Status: DC | PRN
  Administered 2021-01-30 (×4): 50 ug via INTRAVENOUS

## 2021-01-30 MED ORDER — LIDOCAINE 2% (20 MG/ML) 5 ML SYRINGE
INTRAMUSCULAR | Status: AC
  Filled 2021-01-30: qty 5

## 2021-01-30 MED ORDER — OXYCODONE HCL 5 MG PO TABS
5.0000 mg | ORAL_TABLET | ORAL | Status: DC | PRN
  Administered 2021-02-01: 10 mg via ORAL

## 2021-01-30 MED ORDER — ACETAMINOPHEN 500 MG PO TABS: 1000.0000 mg | ORAL_TABLET | Freq: Once | ORAL | Status: DC

## 2021-01-30 MED ORDER — DOCUSATE SODIUM 100 MG PO CAPS
100.0000 mg | ORAL_CAPSULE | Freq: Two times a day (BID) | ORAL | Status: DC
  Administered 2021-01-30 – 2021-02-01 (×4): 100 mg via ORAL
  Filled 2021-01-30 (×4): qty 1

## 2021-01-30 MED ORDER — FENTANYL CITRATE (PF) 100 MCG/2ML IJ SOLN
25.0000 ug | INTRAMUSCULAR | Status: DC | PRN
  Administered 2021-01-30 (×4): 25 ug via INTRAVENOUS

## 2021-01-30 MED ORDER — FENTANYL CITRATE (PF) 100 MCG/2ML IJ SOLN: 50.0000 ug | Freq: Once | INTRAMUSCULAR | Status: AC

## 2021-01-30 MED ORDER — ONDANSETRON HCL 4 MG PO TABS: 4.0000 mg | ORAL_TABLET | Freq: Four times a day (QID) | ORAL | Status: DC | PRN

## 2021-01-30 MED ORDER — TRANEXAMIC ACID 1000 MG/10ML IV SOLN
INTRAVENOUS | Status: DC | PRN
  Administered 2021-01-30: 2000 mg via TOPICAL

## 2021-01-30 MED ORDER — DEXAMETHASONE SODIUM PHOSPHATE 10 MG/ML IJ SOLN
INTRAMUSCULAR | Status: DC | PRN
  Administered 2021-01-30: 10 mg
  Administered 2021-01-30: 5 mg

## 2021-01-30 MED ORDER — SODIUM CHLORIDE 0.9 % IR SOLN
Status: DC | PRN
  Administered 2021-01-30: 1000 mL

## 2021-01-30 SURGICAL SUPPLY — 69 items
ANCHOR JUGGERKNOT WTAP NDL 2.9 (Anchor) ×2 IMPLANT
BAG COUNTER SPONGE SURGICOUNT (BAG) ×10 IMPLANT
BLADE SAGITTAL 25.0X1.27X90 (BLADE) ×2 IMPLANT
BLADE SAW SAG 90X13X1.27 (BLADE) ×2 IMPLANT
BLADE SAW SAG HD 89.5X25X.94 (BLADE) ×2 IMPLANT
BNDG COHESIVE 6X5 TAN STRL LF (GAUZE/BANDAGES/DRESSINGS) ×4 IMPLANT
BNDG ELASTIC 6X15 VLCR STRL LF (GAUZE/BANDAGES/DRESSINGS) ×2 IMPLANT
CANISTER WOUND CARE 500ML ATS (WOUND CARE) ×2 IMPLANT
CANISTER WOUNDNEG PRESSURE 500 (CANNISTER) ×2 IMPLANT
CEMENT BONE R 1X40 (Cement) ×6 IMPLANT
CNTNR URN SCR LID CUP LEK RST (MISCELLANEOUS) ×1 IMPLANT
COMP FEM CMT PS 11 LT (Knees) ×2 IMPLANT
COMP TIBIA FIXED H LT (Joint) ×2 IMPLANT
COMPONENT FEM CMT PS 11 LT (Knees) ×1 IMPLANT
COMPONET TIBIA FIXED H LT (Joint) ×1 IMPLANT
CONT SPEC 4OZ STRL OR WHT (MISCELLANEOUS) ×1
COVER SURGICAL LIGHT HANDLE (MISCELLANEOUS) ×4 IMPLANT
CUFF TOURN SGL QUICK 42 (TOURNIQUET CUFF) ×2 IMPLANT
DRAPE DERMATAC (DRAPES) ×8 IMPLANT
DRAPE HALF SHEET 40X57 (DRAPES) ×4 IMPLANT
DRAPE IMP U-DRAPE 54X76 (DRAPES) ×2 IMPLANT
DRAPE U-SHAPE 47X51 STRL (DRAPES) ×2 IMPLANT
DRESSING PREVENA PLUS CUSTOM (GAUZE/BANDAGES/DRESSINGS) ×1 IMPLANT
DRSG ADAPTIC 3X8 NADH LF (GAUZE/BANDAGES/DRESSINGS) IMPLANT
DRSG PAD ABDOMINAL 8X10 ST (GAUZE/BANDAGES/DRESSINGS) IMPLANT
DRSG PREVENA PLUS CUSTOM (GAUZE/BANDAGES/DRESSINGS) ×2
DURAPREP 26ML APPLICATOR (WOUND CARE) ×2 IMPLANT
ELECT REM PT RETURN 9FT ADLT (ELECTROSURGICAL) ×2
ELECTRODE REM PT RTRN 9FT ADLT (ELECTROSURGICAL) ×1 IMPLANT
FACESHIELD WRAPAROUND (MASK) ×2 IMPLANT
GAUZE SPONGE 4X4 12PLY STRL (GAUZE/BANDAGES/DRESSINGS) IMPLANT
GLOVE SURG ORTHO LTX SZ9 (GLOVE) ×2 IMPLANT
GLOVE SURG POLYISO LF SZ6.5 (GLOVE) ×4 IMPLANT
GLOVE SURG UNDER POLY LF SZ7 (GLOVE) ×2 IMPLANT
GLOVE SURG UNDER POLY LF SZ9 (GLOVE) ×2 IMPLANT
GOWN STRL REUS W/ TWL XL LVL3 (GOWN DISPOSABLE) ×3 IMPLANT
GOWN STRL REUS W/TWL XL LVL3 (GOWN DISPOSABLE) ×3
HANDPIECE INTERPULSE COAX TIP (DISPOSABLE) ×1
HDLS TROCR DRIL PIN KNEE 75 (PIN) ×1
INSERT ASF PS 10-12 GH 14 LT (Insert) ×2 IMPLANT
INSERT TIB PS CMTLS CC FX (Insert) ×2 IMPLANT
INSTR SCRW HEX REV FIX 3.5X48 (ORTHOPEDIC DISPOSABLE SUPPLIES) ×2
INSTRUMENT SCRW HEX REV 3.5X48 (ORTHOPEDIC DISPOSABLE SUPPLIES) ×1 IMPLANT
K-WIRE 102X1.4 (WIRE) ×2 IMPLANT
KIT BASIN OR (CUSTOM PROCEDURE TRAY) ×2 IMPLANT
KIT JUGGERKNOT DISP 2.9MM (KITS) ×2 IMPLANT
KIT TURNOVER KIT B (KITS) ×2 IMPLANT
MANIFOLD NEPTUNE II (INSTRUMENTS) ×2 IMPLANT
NS IRRIG 1000ML POUR BTL (IV SOLUTION) ×2 IMPLANT
PACK TOTAL JOINT (CUSTOM PROCEDURE TRAY) ×2 IMPLANT
PACK UNIVERSAL I (CUSTOM PROCEDURE TRAY) ×2 IMPLANT
PAD ARMBOARD 7.5X6 YLW CONV (MISCELLANEOUS) ×2 IMPLANT
PADDING CAST COTTON 6X4 STRL (CAST SUPPLIES) IMPLANT
PIN DRILL HDLS TROCAR 75 4PK (PIN) ×1 IMPLANT
SET HNDPC FAN SPRY TIP SCT (DISPOSABLE) ×1 IMPLANT
STAPLER VISISTAT 35W (STAPLE) IMPLANT
STEM FEM PERS 15 +135 (Stem) ×2 IMPLANT
STEM STRT SPLINE PS 16X135 (Stem) ×2 IMPLANT
SUCTION FRAZIER HANDLE 10FR (MISCELLANEOUS) ×1
SUCTION TUBE FRAZIER 10FR DISP (MISCELLANEOUS) ×1 IMPLANT
SUT VIC AB 0 CT1 27 (SUTURE)
SUT VIC AB 0 CT1 27XBRD ANBCTR (SUTURE) IMPLANT
SUT VIC AB 1 CTX 36 (SUTURE)
SUT VIC AB 1 CTX36XBRD ANBCTR (SUTURE) IMPLANT
SUT VIC AB 2-0 CTB1 (SUTURE) IMPLANT
TOWEL GREEN STERILE (TOWEL DISPOSABLE) ×2 IMPLANT
TOWEL GREEN STERILE FF (TOWEL DISPOSABLE) ×4 IMPLANT
WATER STERILE IRR 1000ML POUR (IV SOLUTION) ×2 IMPLANT
WRAP KNEE MAXI GEL POST OP (GAUZE/BANDAGES/DRESSINGS) IMPLANT

## 2021-01-30 NOTE — Anesthesia Procedure Notes (Signed)
Procedure Name: LMA Insertion Date/Time: 01/30/2021 9:50 AM Performed by: Caren Macadam, CRNA Pre-anesthesia Checklist: Patient identified, Emergency Drugs available, Suction available and Patient being monitored Patient Re-evaluated:Patient Re-evaluated prior to induction Oxygen Delivery Method: Circle system utilized Preoxygenation: Pre-oxygenation with 100% oxygen Induction Type: IV induction Ventilation: Mask ventilation without difficulty LMA: LMA flexible inserted LMA Size: 5.0 Number of attempts: 1 Placement Confirmation: positive ETCO2 and breath sounds checked- equal and bilateral Tube secured with: Tape Dental Injury: Teeth and Oropharynx as per pre-operative assessment

## 2021-01-30 NOTE — Anesthesia Procedure Notes (Signed)
Anesthesia Regional Block: Adductor canal block   Pre-Anesthetic Checklist: , timeout performed,  Correct Patient, Correct Site, Correct Laterality,  Correct Procedure, Correct Position, site marked,  Risks and benefits discussed,  Surgical consent,  Pre-op evaluation,  At surgeon's request and post-op pain management  Laterality: Left  Prep: Maximum Sterile Barrier Precautions used, chloraprep       Needles:  Injection technique: Single-shot  Needle Type: Echogenic Stimulator Needle     Needle Length: 9cm  Needle Gauge: 22     Additional Needles:   Procedures:,,,, ultrasound used (permanent image in chart),,    Narrative:  Start time: 01/30/2021 9:15 AM End time: 01/30/2021 9:25 AM Injection made incrementally with aspirations every 5 mL.  Performed by: Personally  Anesthesiologist: Elmer Picker, MD  Additional Notes: Monitors applied. No increased pain on injection. No increased resistance to injection. Injection made in 5cc increments. Good needle visualization. Patient tolerated procedure well.

## 2021-01-30 NOTE — Discharge Instructions (Signed)
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
## 2021-01-30 NOTE — TOC Initial Note (Signed)
Transition of Care [facility]) - Initial/Assessment Note    Patient Details  Name: Bill [name] MRN: [id number] Date of Birth: Jun 11, 1966  [facility] CM/SW Contact:    Lawerance Sabal, RN Phone Number: 01/30/2021, 4:13 PM  Clinical Narrative:               From home w wife, LEFT TOTAL KNEE REVISION, with Proveena wound VAC, post op PT OT evals pending. TOC will continue to follow       Barriers to Discharge: Continued Medical Work up   Patient Goals and CMS Choice        Expected Discharge Plan and Services     Discharge Planning Services: CM Consult   Living arrangements for the past 2 months: Single Family Home                                      Prior Living Arrangements/Services Living arrangements for the past 2 months: Single Family Home Lives with:: Spouse                   Activities of Daily Living      Permission Sought/Granted                  Emotional Assessment              Admission diagnosis:  Failed total left knee replacement (HCC) [T84.093A] Patient Active Problem List   Diagnosis Date Noted   Failed total left knee replacement (HCC) 01/30/2021   Failed total knee arthroplasty, sequela    Non-traumatic rupture of patellar tendon, left    Hardware complicating wound infection (HCC) 09/19/2020   Infection of total knee replacement (HCC)    S/P total knee arthroplasty 01/05/2019   Unilateral primary osteoarthritis, right knee    Total knee replacement status, left 07/14/2018   Unilateral primary osteoarthritis, left knee    Morbid obesity (HCC) 06/22/2018   AKI (acute kidney injury) (HCC) 05/31/2017   Cellulitis of left leg 05/30/2017   HLD (hyperlipidemia) 05/30/2017   Back pain 05/30/2017   Hypertension    Aortic dissection (HCC)    Penetrating thigh wound 05/10/2017   Necrotizing fasciitis of pelvic region and thigh (HCC) 05/10/2017   Laceration of knee 05/08/2017   Open knee wound  05/08/2017   Penetrating injury of lower extremity    PCP:  Aggie Cosier, MD Pharmacy:   [facility] [phone] [name], [place] - [address] [place] [address] Phone: [phone] Fax: [phone]     Social Determinants of Health (SDOH) Interventions    Readmission Risk Interventions Readmission Risk Prevention Plan 09/24/2020  Post Dischage Appt Complete  Medication Screening Complete  Transportation Screening Complete  Some recent data might be hidden

## 2021-01-30 NOTE — Interval H&P Note (Signed)
History and Physical Interval Note:  01/30/2021 7:48 AM  Bill [name]  has presented today for surgery, with the diagnosis of Failed Total Knee Arthroplasty.  The various methods of treatment have been discussed with the patient and family. After consideration of risks, benefits and other options for treatment, the patient has consented to  Procedure(s): LEFT TOTAL KNEE REVISION (Left) as a surgical intervention.  The patient's history has been reviewed, patient examined, no change in status, stable for surgery.  I have reviewed the patient's chart and labs.  Questions were answered to the patient's satisfaction.     [name]

## 2021-01-30 NOTE — Progress Notes (Signed)
Orthopedic Tech Progress Note Patient Details:  Bill [name] 1966/04/26 [id number] XL ROM Knee Brace has been ordered from [employer] Patient ID: [name], male   DOB: 1965/08/11, 55 y.o.   MRN: [id number]  [name] 01/30/2021, 2:29 PM

## 2021-01-30 NOTE — Plan of Care (Signed)

## 2021-01-30 NOTE — Op Note (Addendum)
01/30/2021  12:32 PM  PATIENT:  Bill [name]    PRE-OPERATIVE DIAGNOSIS:  Failed Total Knee Arthroplasty  POST-OPERATIVE DIAGNOSIS:  Same  PROCEDURE:  LEFT TOTAL KNEE REVISION  Patella tendon reconstruction with FiberWire and suture knot anchor  Apply axial form wound.  SURGEON:  [name], MD  PHYSICIAN ASSISTANT:None ANESTHESIA:   General  PREOPERATIVE INDICATIONS:  Bill [name] is a  55 y.o. male with a diagnosis of Failed Total Knee Arthroplasty who failed conservative measures and elected for surgical management.    The risks benefits and alternatives were discussed with the patient preoperatively including but not limited to the risks of infection, bleeding, nerve injury, cardiopulmonary complications, the need for revision surgery, among others, and the patient was willing to proceed.  OPERATIVE IMPLANTS: Tibial stem 15 mm x 135 mm, femur size 11, tibia size H, tibial symmetrical,, femoral stem 16 x 135 mm, 14 mm tibial tray.     @ENCIMAGES @  OPERATIVE FINDINGS: Good quality bone stock.  There was avulsion of approximately 50% of the patella tendon that required reconstruction with FiberWire and a anchor knot.  OPERATIVE PROCEDURE: Patient was brought the operating room underwent a general anesthetic.  After adequate levels anesthesia were obtained patient's left lower extremity was prepped using DuraPrep draped into a sterile field a timeout was called.  All exposed skin was covered with Ioban.  His previous midline incision was used and this was carried down to the retinaculum and a medial parapatellar retinaculum incision was made.  There was a thin atrophic changes to the medial aspect of the patella tendon and with the everting the patella this caused insufficiency of the medial band of the insertion of the patellar tendon.  The tibial and femoral antibiotic spacers were removed without complications.  Attention was first focused on the tibia.  This  was sequentially reamed to 15 mm 435 mm stem.  Using the 135 mm stem the external jig was used in the surface of the tibia was freshened this had good bone quality.  Attention was then focused on the femur this was also sequentially reamed up to 16 mm for a 135 x 16 mm stem.This was then sequentially broached more of the tibial.he stem was placed the block to freshen the ends of the bone was also placed.  The tibial and femoral trials were placed with a 14 mm spacer and this had good varus and valgus stability as well as full extension.  The trials were removed the knee was irrigated with pulsatile lavage.  TXA was applied for hemostasis.  The tourniquet was not used.  The surface of the tibia and femoral components were cemented.  No cement was placed down the shaft of the stems.  Once the tibial and femoral components were placed loose cement was removed.  The 14 mm insert was placed and the knee was left in extension until this had hardened.  Attention was then focused on the insertion of the patella tendon.  A suture anchor was placed inferior medially and using Krakw suture technique for FiberWire sutures were placed through the medial half of the patella tendon to reconstruct the medial aspect of the insertion of the patella tendon.  This had good tension.  The patella tracked midline with flexion from 0 to 45 degrees.  The patella tendon repair was not stressed beyond 45 degrees.  The retinaculum was then closed using #1 Vicryl subcu was closed using 0 Vicryl skin was closed using staples.  The Cassandria Anger form wound VAC was applied this had a good suction fit this was overwrapped with Coban patient was extubated taken the PACU in stable condition.  3 bags of antibiotic cement were used.  DISCHARGE PLANNING:  Antibiotic duration: Continue antibiotics for 24 hours  Weightbearing: Weightbearing as tolerated maximal range of motion 0 to 45 degrees  Pain medication: Opioid pathway  Dressing care/  Wound VAC: Continue wound VAC for 1 week  Ambulatory devices: Walker  Discharge to: Discharge planning based on therapy recommendations.  Follow-up: In the office 1 week post operative.       T

## 2021-01-30 NOTE — Transfer of Care (Signed)
Immediate Anesthesia Transfer of Care Note  Patient: Bill [name]  Procedure(s) Performed: LEFT TOTAL KNEE REVISION (Left: Knee)  Patient Location: PACU  Anesthesia Type:General and Regional  Level of Consciousness: awake and alert   Airway & Oxygen Therapy: Patient Spontanous Breathing and Patient connected to face mask oxygen  Post-op Assessment: Report given to RN and Post -op Vital signs reviewed and stable  Post vital signs: Reviewed and stable  Last Vitals:  Vitals Value Taken Time  BP 83/24 01/30/21 1228  Temp    Pulse 54 01/30/21 1230  Resp 11 01/30/21 1230  SpO2 94 % 01/30/21 1230  Vitals shown include unvalidated device data.  Last Pain:  Vitals:   01/30/21 0800  TempSrc:   PainSc: 0-No pain         Complications: No notable events documented.

## 2021-01-31 ENCOUNTER — Encounter (HOSPITAL_COMMUNITY): Payer: Self-pay | Admitting: Orthopedic Surgery

## 2021-01-31 MED ORDER — PANTOPRAZOLE SODIUM 40 MG PO TBEC
40.0000 mg | DELAYED_RELEASE_TABLET | Freq: Every day | ORAL | Status: DC
  Administered 2021-01-31 – 2021-02-01 (×2): 40 mg via ORAL
  Filled 2021-01-31 (×2): qty 1

## 2021-01-31 NOTE — Progress Notes (Signed)
Patient ID: Bill [name], male   DOB: 10/12/1965, 55 y.o.   MRN: [id number] Patient is postoperative day 1 revision left total knee arthroplasty.  Patient had very thin atrophic patellar tendons insertion.  This was reinforced with a FiberWire anchor.  Discussed with the patient that he needs to wear the Bledsoe brace, that was applied in the recovery room, at all times, range of motion 0 to 45 degrees.  Discussed that if he bends his knee past 45 degrees there is a high likelihood with the leverage of his leg and his body weight that he could avulsed the patella tendon.  Discussed the this would be extremely difficult to repair.  Anticipate discharge in the next day or 2 when patient is safe with therapy.  Patient has no complaints of pain this morning.

## 2021-01-31 NOTE — Anesthesia Postprocedure Evaluation (Signed)
Anesthesia Post Note  Patient: Jaterrius Ricketson  Procedure(s) Performed: LEFT TOTAL KNEE REVISION (Left: Knee)     Patient location during evaluation: PACU Anesthesia Type: Regional and General Level of consciousness: awake and alert Pain management: pain level controlled Vital Signs Assessment: post-procedure vital signs reviewed and stable Respiratory status: spontaneous breathing, nonlabored ventilation, respiratory function stable and patient connected to nasal cannula oxygen Cardiovascular status: blood pressure returned to baseline and stable Postop Assessment: no apparent nausea or vomiting Anesthetic complications: no   No notable events documented.  Last Vitals:  Vitals:   01/31/21 0217 01/31/21 0510  BP: (!) 100/57 (!) 83/51  Pulse: 75 67  Resp:  16  Temp:  37.5 C  SpO2:  96%    Last Pain:  Vitals:   01/31/21 0510  TempSrc: Oral  PainSc:                  Micky Overturf L Thalya Fouche

## 2021-01-31 NOTE — Evaluation (Signed)
Physical Therapy Evaluation Patient Details Name: Bill [name] MRN: [id number] DOB: 11/17/1965 Today's Date: 01/31/2021   History of Present Illness  Bill [name], 55 y.o. male, admitted 7/6 for TKA revision. He has a history of pain and functional disability in the left knee(s) due to failed previous arthroplasty and patient has failed non-surgical conservative treatments for greater than 12 weeks to include flexibility and strengthening excercises. The indications for the revision of the total knee arthroplasty are history of total knee infection. Onset of symptoms was gradual starting 1 years ago with gradually worsening course since that time  Clinical Impression  Pt fully participated in evaluation. Pt was using AD prior to admission due to spacer and weight bearing restrictions. Pt requiring min A for LLE management for bed mobility and S when OOB. Pt will benefit from skilled PT to address deficits in balance, strength, coordination, gait, endurance and safety to maximize independence with functional mobility prior to discharge.     Follow Up Recommendations Home health PT;Supervision - Intermittent    Equipment Recommendations  None recommended by PT (pt states he has all the equipment)    Recommendations for Other Services       Precautions / Restrictions Precautions Precautions: Fall Required Braces or Orthoses: Other Brace Other Brace: brace on at all times. ROM 0-45 degrees Restrictions Weight Bearing Restrictions: Yes LLE Weight Bearing: Weight bearing as tolerated      Mobility  Bed Mobility Overal bed mobility: Needs Assistance Bed Mobility: Supine to Sit;Sit to Supine;Rolling Rolling: Supervision   Supine to sit: Min assist Sit to supine: Min assist   General bed mobility comments: assist wtih LLE; S for rolling to maintain knee ROM restrictions, brace in closet upon arrival. pt requesting to get dressed then done brace. therapist assisting  with LLE to maintain alignment while rolling to pull up pants    Transfers Overall transfer level: Needs assistance Equipment used: Rolling walker (2 wheeled) Transfers: Sit to/from Stand Sit to Stand: Min guard;Supervision         General transfer comment: pt turning RW sideways and pushing up from RW and bedrail. performed x 1 trial min guard and 2 S  Ambulation/Gait Ambulation/Gait assistance: Supervision Gait Distance (Feet): 80 Feet Assistive device: Rolling walker (2 wheeled) Gait Pattern/deviations: Antalgic;Wide base of support;Decreased stance time - left        Stairs Stairs:  (education wtih demonstration and teach back method used for stair negotiation)          Wheelchair Mobility    Modified Rankin (Stroke Patients Only)       Balance Overall balance assessment: Needs assistance Sitting-balance support: No upper extremity supported;Feet supported Sitting balance-Leahy Scale: Good     Standing balance support: Single extremity supported Standing balance-Leahy Scale: Fair                               Pertinent Vitals/Pain Pain Assessment: 0-10 Pain Score: 2     Home Living Family/patient expects to be discharged to:: Private residence Living Arrangements: Spouse/significant other Available Help at Discharge: Family;Available 24 hours/day Type of Home: House Home Access: Stairs to enter Entrance Stairs-Rails: Doctor, general practice of Steps: 3 Home Layout: Two level;Able to live on main level with bedroom/bathroom Home Equipment: Dan Humphreys - 2 wheels;Cane - single point;Grab bars - toilet;Grab bars - tub/shower;Hand held shower head;Wheelchair - manual      Prior Function Level of Independence: Independent  with assistive device(s)         Comments: RW     Hand Dominance   Dominant Hand: Left    Extremity/Trunk Assessment   Upper Extremity Assessment Upper Extremity Assessment: Overall WFL for tasks  assessed    Lower Extremity Assessment Lower Extremity Assessment: LLE deficits/detail LLE: Unable to fully assess due to pain;Unable to fully assess due to immobilization LLE Sensation: decreased light touch    Cervical / Trunk Assessment Cervical / Trunk Assessment: Normal  Communication   Communication: No difficulties  Cognition Arousal/Alertness: Awake/alert Behavior During Therapy: WFL for tasks assessed/performed Overall Cognitive Status: Within Functional Limits for tasks assessed                                        General Comments General comments (skin integrity, edema, etc.): VSS on RA. increased time spent educating pt on purpose of brace. Pt requiring education on ROM restrictions    Exercises     Assessment/Plan    PT Assessment Patient needs continued PT services  PT Problem List Decreased strength;Decreased mobility;Decreased range of motion;Decreased coordination;Decreased activity tolerance;Decreased balance       PT Treatment Interventions DME instruction;Therapeutic exercise;Gait training;Balance training;Stair training;Therapeutic activities;Patient/family education    PT Goals (Current goals can be found in the Care Plan section)  Acute Rehab PT Goals Patient Stated Goal: to get my leg feeling better PT Goal Formulation: With patient Time For Goal Achievement: 02/14/21 Potential to Achieve Goals: Good    Frequency 7X/week   Barriers to discharge        Co-evaluation               AM-PAC PT "6 Clicks" Mobility  Outcome Measure Help needed turning from your back to your side while in a flat bed without using bedrails?: A Little Help needed moving from lying on your back to sitting on the side of a flat bed without using bedrails?: A Little Help needed moving to and from a bed to a chair (including a wheelchair)?: A Little Help needed standing up from a chair using your arms (e.g., wheelchair or bedside chair)?: A  Little Help needed to walk in hospital room?: A Little Help needed climbing 3-5 steps with a railing? : A Little 6 Click Score: 18    End of Session Equipment Utilized During Treatment: Gait belt;Other (comment) (K brace) Activity Tolerance: Patient tolerated treatment well Patient left: in bed;with call bell/phone within reach Nurse Communication: Mobility status PT Visit Diagnosis: Unsteadiness on feet (R26.81);Other abnormalities of gait and mobility (R26.89);Muscle weakness (generalized) (M62.81)    Time: 7253-6644 (therapist spent 15 minutes looking for equipment, will not charge for those 15 minutes) PT Time Calculation (min) (ACUTE ONLY): 63 min   Charges:   PT Evaluation $PT Eval Low Complexity: 1 Low PT Treatments $Therapeutic Activity: 23-37 mins        Ginette Otto, DPT Acute Rehabilitation Services [phone]   Lucretia Field 01/31/2021, 1:21 PM

## 2021-01-31 NOTE — Progress Notes (Signed)
Patients blood pressure ran soft throughout the night but patient was not symptomatic and stated "he feels good." May want to consider holding bp medication if low bp persist.

## 2021-01-31 NOTE — Progress Notes (Signed)
Pt and wife asked if PT could come to work with pt in the afternoon tomorrow so that pt's wife is able to be there for session.

## 2021-01-31 NOTE — Progress Notes (Signed)
Physical Therapy Treatment Patient Details Name: Bill [name] MRN: [id number] DOB: Dec 20, 1965 Today's Date: 01/31/2021    History of Present Illness Bill [name], 55 y.o. male, admitted 7/6 for TKA revision. He has a history of pain and functional disability in the left knee(s) due to failed previous arthroplasty and patient has failed non-surgical conservative treatments for greater than 12 weeks to include flexibility and strengthening excercises. The indications for the revision of the total knee arthroplasty are history of total knee infection. Onset of symptoms was gradual starting 1 years ago with gradually worsening course since that time    PT Comments    Pt is making good progress with mobility, displaying improved weight bearing tolerance through his L lower extremity to permit improved R foot clearance and step length during gait. Pt not needing any physical assistance to transfer or ambulate, but needed minA to manage his L leg with bed mobility. Educated pt on possible sideways negotiation of stairs if needed going home, pt verbalized understanding and verbalized no need to practice stairs as he has learned how to do them prior to this surgery. Educated pt and his wife on brace locks and ROM restrictions, HEP to perform ankle pumps and quad sets, and edema management. Will continue to follow acutely. Current recommendations remain appropriate.    Follow Up Recommendations  Home health PT;Supervision - Intermittent     Equipment Recommendations  None recommended by PT (pt states he has all equipment)    Recommendations for Other Services       Precautions / Restrictions Precautions Precautions: Fall Precaution Comments: L knee wound vac Required Braces or Orthoses: Other Brace Other Brace: brace on at all times. ROM 0-45 degrees Restrictions Weight Bearing Restrictions: Yes LLE Weight Bearing: Weight bearing as tolerated    Mobility  Bed  Mobility Overal bed mobility: Needs Assistance Bed Mobility: Supine to Sit;Sit to Supine Rolling: Supervision   Supine to sit: Min assist Sit to supine: Min assist   General bed mobility comments: Pt's wife providing assistance at L lower extremity with supine > sit exiting R EOB. MinA at L leg by PT with return to supine, good initiation noted by pt.    Transfers Overall transfer level: Needs assistance Equipment used: Rolling walker (2 wheeled) Transfers: Sit to/from Stand Sit to Stand: Supervision         General transfer comment: pt turning RW sideways and pushing up from RW and bedrail. Supervision for safety, no LOB. Educated pt and wife on pushing L leg anteriorly for sit <> stand to reduce pain as knee flexion ROM is limited.  Ambulation/Gait Ambulation/Gait assistance: Supervision Gait Distance (Feet): 90 Feet Assistive device: Rolling walker (2 wheeled) Gait Pattern/deviations: Antalgic;Wide base of support;Decreased stance time - left;Decreased weight shift to left;Decreased step length - right Gait velocity: reduced Gait velocity interpretation: 1.31 - 2.62 ft/sec, indicative of limited community ambulator General Gait Details: Pt with antalgic gait pattern, displaying decreased L weight shift and stance time and thus R step length and foot clearance. Cued pt to increase WB on L and exaggerate R march to improve R foot clearance, success noted as pt accepting more weight on L. Supervision for safety, no LOB.   Stairs Stairs:  (education wtih demonstration and teach back method used for stair negotiation)           Wheelchair Mobility    Modified Rankin (Stroke Patients Only)       Balance Overall balance assessment: Needs assistance Sitting-balance  support: No upper extremity supported;Feet supported Sitting balance-Leahy Scale: Good     Standing balance support: No upper extremity supported;Single extremity supported;Bilateral upper extremity  supported;During functional activity Standing balance-Leahy Scale: Fair Standing balance comment: Able to stand statically without UE support but relies on UE support for mobility.                            Cognition Arousal/Alertness: Awake/alert Behavior During Therapy: WFL for tasks assessed/performed Overall Cognitive Status: Within Functional Limits for tasks assessed                                        Exercises      General Comments General comments (skin integrity, edema, etc.): Pt unhooked wound vac start of session, stating it would be fine even though educated that should ask RN; educated on HEP verbally to perform ankle pumps and quad sets and to elevated L lower extremity to manage edema; educated pt and wife on stair negotiation, educating that descending may be more difficult as knee flexion is limited and if difficult to perform sideways with bil hands on rails if needed, they verbalized understanding with pt able to state correct sequencing of feet ascending and descending and reporting he did not need to practice it as he understood      Pertinent Vitals/Pain Pain Assessment: Faces Faces Pain Scale: Hurts little more Pain Location: L knee Pain Descriptors / Indicators: Discomfort;Guarding;Grimacing;Operative site guarding Pain Intervention(s): Limited activity within patient's tolerance;Monitored during session;Repositioned    Home Living                      Prior Function            PT Goals (current goals can now be found in the care plan section) Acute Rehab PT Goals Patient Stated Goal: to get better and go home PT Goal Formulation: With patient/family Time For Goal Achievement: 02/14/21 Potential to Achieve Goals: Good Progress towards PT goals: Progressing toward goals    Frequency    7X/week      PT Plan Current plan remains appropriate    Co-evaluation              AM-PAC PT "6 Clicks"  Mobility   Outcome Measure  Help needed turning from your back to your side while in a flat bed without using bedrails?: A Little Help needed moving from lying on your back to sitting on the side of a flat bed without using bedrails?: A Little Help needed moving to and from a bed to a chair (including a wheelchair)?: A Little Help needed standing up from a chair using your arms (e.g., wheelchair or bedside chair)?: A Little Help needed to walk in hospital room?: A Little Help needed climbing 3-5 steps with a railing? : A Little 6 Click Score: 18    End of Session Equipment Utilized During Treatment: Gait belt;Other (comment) (k brace) Activity Tolerance: Patient tolerated treatment well Patient left: in bed;with call bell/phone within reach;with bed alarm set;with family/visitor present Nurse Communication: Mobility status;Other (comment) (pt unhooking wound vac) PT Visit Diagnosis: Unsteadiness on feet (R26.81);Other abnormalities of gait and mobility (R26.89);Muscle weakness (generalized) (M62.81);Difficulty in walking, not elsewhere classified (R26.2);Pain Pain - Right/Left: Left Pain - part of body: Knee     Time: 5732-2025 PT Time Calculation (min) (  ACUTE ONLY): 32 min  Charges:  $Gait Training: 8-22 mins $Therapeutic Activity: 8-22 mins                     Raymond Gurney, PT, DPT Acute Rehabilitation Services  Pager: [phone] Office: [phone]    Jewel Baize 01/31/2021, 4:24 PM

## 2021-02-01 MED ORDER — ASPIRIN 325 MG PO TBEC: 325.0000 mg | DELAYED_RELEASE_TABLET | Freq: Every day | ORAL | 0 refills | Status: AC

## 2021-02-01 MED ORDER — OXYCODONE HCL 5 MG PO TABS: 5.0000 mg | ORAL_TABLET | ORAL | 0 refills | Status: AC | PRN

## 2021-02-01 NOTE — Discharge Summary (Signed)
Discharge Diagnoses:  Active Problems:   Failed total knee arthroplasty, sequela   Non-traumatic rupture of patellar tendon, left   Failed total left knee replacement (HCC)   Surgeries: Procedure(s): LEFT TOTAL KNEE REVISION on 01/30/2021    Consultants:   Discharged Condition: Improved  Hospital Course: Bill [name] is an 55 y.o. male who was admitted 01/30/2021 with a chief complaint of Left failed knee replacement with a final diagnosis of Failed Total Knee Arthroplasty.  Patient was brought to the operating room on 01/30/2021 and underwent Procedure(s): LEFT TOTAL KNEE REVISION.    Patient was given perioperative antibiotics:  Anti-infectives (From admission, onward)    Start     Dose/Rate Route Frequency Ordered Stop   01/30/21 1800  ceFAZolin (ANCEF) IVPB 2g/100 mL premix        2 g 200 mL/hr over 30 Minutes Intravenous Every 6 hours 01/30/21 1549 01/31/21 0002   01/30/21 0730  ceFAZolin (ANCEF) IVPB 3g/100 mL premix        3 g 200 mL/hr over 30 Minutes Intravenous On call to O.R. 01/30/21 6834 01/30/21 0955     .  Patient was given sequential compression devices, early ambulation, and aspirin for DVT prophylaxis.  Recent vital signs: Patient Vitals for the past 24 hrs:  BP Temp Temp src Pulse Resp SpO2  01/31/21 2100 (!) 98/57 98.2 F (36.8 C) Oral 66 18 97 %  01/31/21 1304 (!) 95/50 98.9 F (37.2 C) Oral 62 18 97 %  01/31/21 0900 (!) 82/51 98.3 F (36.8 C) Oral 68 20 96 %  .  Recent laboratory studies: No results found.  Discharge Medications:   Allergies as of 02/01/2021   No Known Allergies      Medication List     STOP taking these medications    acetaminophen 500 MG tablet Commonly known as: TYLENOL   fluconazole 200 MG tablet Commonly known as: DIFLUCAN   furosemide 20 MG tablet Commonly known as: LASIX   oxyCODONE-acetaminophen 5-325 MG tablet Commonly known as: PERCOCET/ROXICET       TAKE these medications    aspirin 325 MG EC  tablet Take 1 tablet (325 mg total) by mouth daily with breakfast.   bisoprolol 10 MG tablet Commonly known as: ZEBETA Take 10 mg by mouth at bedtime.   hydrochlorothiazide 25 MG tablet Commonly known as: HYDRODIURIL Take 25 mg by mouth daily.   losartan 100 MG tablet Commonly known as: COZAAR Take 100 mg by mouth daily.   oxyCODONE 5 MG immediate release tablet Commonly known as: Oxy IR/ROXICODONE Take 1-2 tablets (5-10 mg total) by mouth every 4 (four) hours as needed for moderate pain (pain score 4-6).   pravastatin 20 MG tablet Commonly known as: PRAVACHOL Take 20 mg by mouth at bedtime.   topiramate 50 MG tablet Commonly known as: TOPAMAX Take 50 mg by mouth 2 (two) times daily.        Diagnostic Studies: No results found.  Patient benefited maximally from their hospital stay and there were no complications.     Disposition: Discharge disposition: 01-Home or Self Care      Discharge Instructions     Call MD / Call 911   Complete by: As directed    If you experience chest pain or shortness of breath, CALL 911 and be transported to the hospital emergency room.  If you develope a fever above 101 F, pus (white drainage) or increased drainage or redness at the wound, or calf pain, call  your surgeon's office.   Constipation Prevention   Complete by: As directed    Drink plenty of fluids.  Prune juice may be helpful.  You may use a stool softener, such as Colace (over the counter) 100 mg twice a day.  Use MiraLax (over the counter) for constipation as needed.   Diet - low sodium heart healthy   Complete by: As directed    Increase activity slowly as tolerated   Complete by: As directed    Negative Pressure Wound Therapy - Incisional   Complete by: As directed    Show patient how to attach preveena vac. Should call office if alarms   Post-operative opioid taper instructions:   Complete by: As directed    POST-OPERATIVE OPIOID TAPER INSTRUCTIONS: It is  important to wean off of your opioid medication as soon as possible. If you do not need pain medication after your surgery it is ok to stop day one. Opioids include: Codeine, Hydrocodone(Norco, Vicodin), Oxycodone(Percocet, oxycontin) and hydromorphone amongst others.  Long term and even short term use of opiods can cause: Increased pain response Dependence Constipation Depression Respiratory depression And more.  Withdrawal symptoms can include Flu like symptoms Nausea, vomiting And more Techniques to manage these symptoms Hydrate well Eat regular healthy meals Stay active Use relaxation techniques(deep breathing, meditating, yoga) Do Not substitute Alcohol to help with tapering If you have been on opioids for less than two weeks and do not have pain than it is ok to stop all together.  Plan to wean off of opioids This plan should start within one week post op of your joint replacement. Maintain the same interval or time between taking each dose and first decrease the dose.  Cut the total daily intake of opioids by one tablet each day Next start to increase the time between doses. The last dose that should be eliminated is the evening dose.          Follow-up Information     Adonis Huguenin, NP Follow up in 1 week(s).   Specialty: Orthopedic Surgery Contact information: [address] [place] [address] [phone]                  Signed: [place] Carollyn Etcheverry 02/01/2021, 7:29 AM

## 2021-02-01 NOTE — Discharge Summary (Deleted)
  Patient is post op day 2 s/p left total knee revision. He is doing well . Denies fever chills or calf pain  VSS alert pleasant comfortable. Vac functioning well no new drainage in the cannister   Plan dc today follow up 1 week

## 2021-02-01 NOTE — Progress Notes (Signed)
Patient discharged home with wife. All discharge education  has been given and explained to patient and spouse. Patient was wheeled down with Aid and wife at side. Patient left in stable condition. Vitals are stable and patient is awake and alert.

## 2021-02-01 NOTE — Progress Notes (Signed)
POD2 Left Total Knee Revision. Patient doing well ambulating in halls. Denies fever chills or calf pain  No further drainage in cannister. Wound vac functioning well Dressing dry calves soft non tender  DC home

## 2021-02-01 NOTE — Progress Notes (Signed)
Physical Therapy Treatment Patient Details Name: Bill [name] MRN: [id number] DOB: Aug 16, 1965 Today's Date: 02/01/2021    History of Present Illness Bill [name], 55 y.o. male, admitted 7/6 for TKA revision. He has a history of pain and functional disability in the left knee(s) due to failed previous arthroplasty and patient has failed non-surgical conservative treatments for greater than 12 weeks to include flexibility and strengthening excercises. The indications for the revision of the total knee arthroplasty are history of total knee infection. Onset of symptoms was gradual starting 1 years ago with gradually worsening course since that time    PT Comments    Pt receptive to education and able to perform teach back for stair negotiation. Pt states he will get enough movement when getting home to get into the house. Pt receptive to ROM restrictions and verbalized understanding on importance of knee extension. Pt will continue to benefit from skilled PT to address deficits in balance, strength, coordination, gait and endurance to maximize independence wtihf unctional mobility prior to discharge.    Follow Up Recommendations  Home health PT;Supervision - Intermittent     Equipment Recommendations  None recommended by PT    Recommendations for Other Services       Precautions / Restrictions      Mobility  Bed Mobility                    Transfers                    Ambulation/Gait                 Stairs             Wheelchair Mobility    Modified Rankin (Stroke Patients Only)       Balance                                            Cognition                                              Exercises      General Comments General comments (skin integrity, edema, etc.): pt was eating lunch; education provided regarding stairs and pt again able to teach back, education on car transfer  and position of front seat to ensure pt can inhibit knee flexion> 45 degree, increased time spent educating pt on purpose of ROM deficits and importance of knee extension ROM. edcuation on working on knee flexion once cleared from MD. Wound vac canister not placed correctly, therapist locked in and resumed wound vac      Pertinent Vitals/Pain      Home Living                      Prior Function            PT Goals (current goals can now be found in the care plan section) Acute Rehab PT Goals Patient Stated Goal: to get better and go home PT Goal Formulation: With patient/family Time For Goal Achievement: 02/14/21 Potential to Achieve Goals: Good Progress towards PT goals: Progressing toward goals    Frequency    7X/week      PT Plan Current  plan remains appropriate    Co-evaluation              AM-PAC PT "6 Clicks" Mobility   Outcome Measure  Help needed turning from your back to your side while in a flat bed without using bedrails?: A Little Help needed moving from lying on your back to sitting on the side of a flat bed without using bedrails?: A Little Help needed moving to and from a bed to a chair (including a wheelchair)?: A Little Help needed standing up from a chair using your arms (e.g., wheelchair or bedside chair)?: A Little Help needed to walk in hospital room?: A Little Help needed climbing 3-5 steps with a railing? : A Little 6 Click Score: 18    End of Session Equipment Utilized During Treatment: Gait belt;Other (comment) (K brace) Activity Tolerance: Other (comment) (pt receptive to education) Patient left: in bed;with call bell/phone within reach;with bed alarm set Nurse Communication: Mobility status PT Visit Diagnosis: Unsteadiness on feet (R26.81);Other abnormalities of gait and mobility (R26.89);Muscle weakness (generalized) (M62.81);Difficulty in walking, not elsewhere classified (R26.2);Pain     Time: 5784-6962 PT Time  Calculation (min) (ACUTE ONLY): 12 min  Charges:  $Therapeutic Activity: 8-22 mins                     Ginette Otto, DPT Acute Rehabilitation Services [phone]    Lucretia Field 02/01/2021, 12:30 PM

## 2021-02-03 ENCOUNTER — Other Ambulatory Visit: Payer: Self-pay | Admitting: Physician Assistant

## 2021-02-03 DIAGNOSIS — Z96651 Presence of right artificial knee joint: Secondary | ICD-10-CM

## 2021-02-04 ENCOUNTER — Telehealth: Payer: Self-pay | Admitting: Physician Assistant

## 2021-02-04 NOTE — Telephone Encounter (Signed)
[name] Pam called advised the brace patient is wearing on his left knee came open. Pam asked if she can get a call to see if the dials on his brace is set right. Pam said she took pictures and would like to e-mail the pictures to show what the brace looks like   The number to  contact Pam is [phone]

## 2021-02-07 ENCOUNTER — Ambulatory Visit (INDEPENDENT_AMBULATORY_CARE_PROVIDER_SITE_OTHER): Payer: Managed Care, Other (non HMO) | Admitting: Physician Assistant

## 2021-02-07 ENCOUNTER — Encounter: Payer: Self-pay | Admitting: Physician Assistant

## 2021-02-07 DIAGNOSIS — T84018S Broken internal joint prosthesis, other site, sequela: Secondary | ICD-10-CM

## 2021-02-07 DIAGNOSIS — Z96652 Presence of left artificial knee joint: Secondary | ICD-10-CM

## 2021-02-07 DIAGNOSIS — M25562 Pain in left knee: Secondary | ICD-10-CM
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
## 2021-02-14 ENCOUNTER — Ambulatory Visit (INDEPENDENT_AMBULATORY_CARE_PROVIDER_SITE_OTHER): Payer: Managed Care, Other (non HMO) | Admitting: Orthopedic Surgery

## 2021-02-14 ENCOUNTER — Ambulatory Visit (INDEPENDENT_AMBULATORY_CARE_PROVIDER_SITE_OTHER): Payer: Managed Care, Other (non HMO)

## 2021-02-14 DIAGNOSIS — Z96652 Presence of left artificial knee joint: Secondary | ICD-10-CM

## 2021-02-14 DIAGNOSIS — T84018S Broken internal joint prosthesis, other site, sequela: Secondary | ICD-10-CM

## 2021-02-14 DIAGNOSIS — M25562 Pain in left knee: Secondary | ICD-10-CM | POA: Diagnosis not present

## 2021-02-14 DIAGNOSIS — G8929 Other chronic pain: Secondary | ICD-10-CM

## 2021-02-14 DIAGNOSIS — Z96659 Presence of unspecified artificial knee joint: Secondary | ICD-10-CM

## 2021-02-14 DIAGNOSIS — T8459XS Infection and inflammatory reaction due to other internal joint prosthesis, sequela: Secondary | ICD-10-CM

## 2021-02-24 ENCOUNTER — Encounter: Payer: Self-pay | Admitting: Orthopedic Surgery
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
## 2021-03-12 ENCOUNTER — Ambulatory Visit (INDEPENDENT_AMBULATORY_CARE_PROVIDER_SITE_OTHER): Payer: Managed Care, Other (non HMO) | Admitting: Physician Assistant

## 2021-03-12 ENCOUNTER — Encounter: Payer: Self-pay | Admitting: Orthopedic Surgery

## 2021-03-12 ENCOUNTER — Other Ambulatory Visit: Payer: Self-pay

## 2021-03-12 DIAGNOSIS — Z96652 Presence of left artificial knee joint: Secondary | ICD-10-CM

## 2021-03-12 DIAGNOSIS — Z96659 Presence of unspecified artificial knee joint: Secondary | ICD-10-CM

## 2021-03-12 DIAGNOSIS — T84018S Broken internal joint prosthesis, other site, sequela: Secondary | ICD-10-CM
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
## 2021-04-09 ENCOUNTER — Ambulatory Visit: Payer: Managed Care, Other (non HMO) | Admitting: Orthopedic Surgery

## 2021-04-11 ENCOUNTER — Encounter: Payer: Self-pay | Admitting: Orthopedic Surgery

## 2021-04-11 ENCOUNTER — Ambulatory Visit (INDEPENDENT_AMBULATORY_CARE_PROVIDER_SITE_OTHER): Payer: Managed Care, Other (non HMO) | Admitting: Orthopedic Surgery

## 2021-04-11 ENCOUNTER — Other Ambulatory Visit: Payer: Self-pay

## 2021-04-11 ENCOUNTER — Telehealth: Payer: Self-pay

## 2021-04-11 DIAGNOSIS — Z96652 Presence of left artificial knee joint: Secondary | ICD-10-CM

## 2021-04-11 NOTE — Telephone Encounter (Signed)
[facility] [phone] called to day and sw Bjorn Loser about the pt's physical therapy visits. She advised that she had submit information to the insurance company for approval of more visits. I advised that Dr. Lajoyce Corners dictated a note from today's visit and can fax this in for additional support. Fax [phone]. Faxed note and will call with any questions.
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
## 2021-05-06 ENCOUNTER — Telehealth: Payer: Self-pay | Admitting: Orthopedic Surgery

## 2021-05-06 NOTE — Telephone Encounter (Signed)
Jacquenette Shone the case manager with [facility] called and states that Dr.Duda needs to put in a request with [facility]) for physical therapy and not [employer].  ASH # [id number]  Erskine Emery [phone] ext [phone]

## 2021-05-07 NOTE — Telephone Encounter (Signed)
Information sent to [facility] physical therapy as they will be the ones getting auth for physical therapy.

## 2021-05-09 ENCOUNTER — Ambulatory Visit (INDEPENDENT_AMBULATORY_CARE_PROVIDER_SITE_OTHER): Payer: Managed Care, Other (non HMO) | Admitting: Orthopedic Surgery

## 2021-05-09 DIAGNOSIS — Z96659 Presence of unspecified artificial knee joint: Secondary | ICD-10-CM | POA: Diagnosis not present

## 2021-05-09 DIAGNOSIS — Z96652 Presence of left artificial knee joint: Secondary | ICD-10-CM

## 2021-05-09 DIAGNOSIS — T84018S Broken internal joint prosthesis, other site, sequela: Secondary | ICD-10-CM

## 2021-05-12 ENCOUNTER — Encounter: Payer: Self-pay | Admitting: Orthopedic Surgery
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
## 2021-07-09 ENCOUNTER — Ambulatory Visit (INDEPENDENT_AMBULATORY_CARE_PROVIDER_SITE_OTHER): Payer: Managed Care, Other (non HMO)

## 2021-07-09 ENCOUNTER — Other Ambulatory Visit: Payer: Self-pay

## 2021-07-09 ENCOUNTER — Ambulatory Visit (INDEPENDENT_AMBULATORY_CARE_PROVIDER_SITE_OTHER): Payer: Managed Care, Other (non HMO) | Admitting: Orthopedic Surgery

## 2021-07-09 DIAGNOSIS — G8929 Other chronic pain: Secondary | ICD-10-CM

## 2021-07-09 DIAGNOSIS — Z96652 Presence of left artificial knee joint: Secondary | ICD-10-CM

## 2021-07-09 DIAGNOSIS — M25562 Pain in left knee: Secondary | ICD-10-CM

## 2021-07-16 ENCOUNTER — Encounter: Payer: Self-pay | Admitting: Orthopedic Surgery
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
## 2022-08-18 IMAGING — NM NM BONE 3 PHASE
2 series · 12 of 12 positions shown · non-contrast
Comparison: Plain radiograph 05/08/2020 and 10/28/2018

CLINICAL DATA: Status post left total knee arthroplasty in 4369
complicated by septic arthritis in 8851 now with persistent left
knee pain. Right total knee arthroplasty 7070.

EXAM:
NUCLEAR MEDICINE 3-PHASE BONE SCAN
TECHNIQUE: Radionuclide angiographic images, immediate static blood pool
images, and 3-hour delayed static images were obtained of the knees
bilaterally after intravenous injection of radiopharmaceutical.
RADIOPHARMACEUTICALS:  22.0 mCi 7c-FFm MDP IV

[Series 1: flow · 4.14mm/px · 6 of 40 frames shown (1 of 2)]
[frame 4/40  full-range]
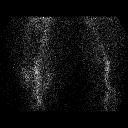
[frame 10/40  full-range]
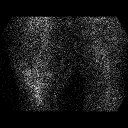
[frame 17/40  full-range]
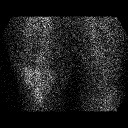
[frame 24/40  full-range]
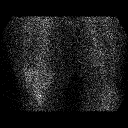
[frame 30/40  full-range]
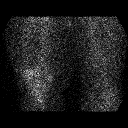
[frame 37/40  full-range]
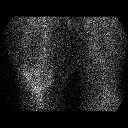

[Series 1: flow · 4.14mm/px · 6 of 40 frames shown (2 of 2)]
[frame 4/40  full-range]
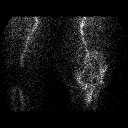
[frame 10/40  full-range]
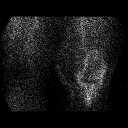
[frame 17/40  full-range]
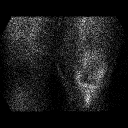
[frame 24/40  full-range]
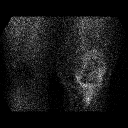
[frame 30/40  full-range]
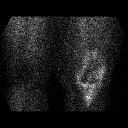
[frame 37/40  full-range]
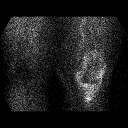

[12 of 12 positions shown; findings below may reference images not displayed]

FINDINGS: Vascular phase: There is asymmetric increased perfusion diffusely of
the left knee.

Blood pool phase: There is diffusely increased hyperemia of the left
knee surrounding the photopenic defect related to left total knee
arthroplasty. No hyperemia involving the right knee.

Delayed phase: Delayed images demonstrate diffusely increased uptake
rib radiotracer within the distal left femur, patella, and proximal
tibia.
IMPRESSION: Diffusely increased perfusion, hyperemia, and delayed radiotracer
uptake involving the left knee suspicious for changes of septic
arthritis/osteomyelitis.

## 2024-03-24 ENCOUNTER — Ambulatory Visit (INDEPENDENT_AMBULATORY_CARE_PROVIDER_SITE_OTHER): Admitting: Orthopedic Surgery

## 2024-03-24 ENCOUNTER — Other Ambulatory Visit (INDEPENDENT_AMBULATORY_CARE_PROVIDER_SITE_OTHER): Payer: Self-pay

## 2024-03-24 ENCOUNTER — Encounter: Payer: Self-pay | Admitting: Orthopedic Surgery

## 2024-03-24 DIAGNOSIS — M25511 Pain in right shoulder: Secondary | ICD-10-CM

## 2024-03-24 DIAGNOSIS — M7501 Adhesive capsulitis of right shoulder: Secondary | ICD-10-CM

## 2024-03-24 DIAGNOSIS — G8929 Other chronic pain: Secondary | ICD-10-CM | POA: Diagnosis not present

## 2024-03-24 MED ORDER — METHYLPREDNISOLONE ACETATE 40 MG/ML IJ SUSP
40.0000 mg | INTRAMUSCULAR | Status: AC | PRN
  Administered 2024-03-24: 40 mg via INTRA_ARTICULAR

## 2024-03-24 MED ORDER — LIDOCAINE HCL 1 % IJ SOLN
5.0000 mL | INTRAMUSCULAR | Status: AC | PRN
  Administered 2024-03-24: 5 mL
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
## 2024-05-30 ENCOUNTER — Encounter: Payer: Self-pay | Admitting: Radiology
